# Patient Record
Sex: Female | Born: 1950 | Race: White | Hispanic: No | Marital: Married | Smoking: Former smoker
Health system: Southern US, Community
[De-identification: ages and names within clinical notes are randomized; demographics above are authoritative.]

## PROBLEM LIST (undated history)

## (undated) DIAGNOSIS — I341 Nonrheumatic mitral (valve) prolapse: Secondary | ICD-10-CM

## (undated) DIAGNOSIS — M810 Age-related osteoporosis without current pathological fracture: Secondary | ICD-10-CM

## (undated) DIAGNOSIS — E785 Hyperlipidemia, unspecified: Secondary | ICD-10-CM

## (undated) DIAGNOSIS — I499 Cardiac arrhythmia, unspecified: Secondary | ICD-10-CM

## (undated) DIAGNOSIS — G479 Sleep disorder, unspecified: Secondary | ICD-10-CM

## (undated) DIAGNOSIS — F419 Anxiety disorder, unspecified: Secondary | ICD-10-CM

## (undated) DIAGNOSIS — Z85828 Personal history of other malignant neoplasm of skin: Secondary | ICD-10-CM

## (undated) DIAGNOSIS — I059 Rheumatic mitral valve disease, unspecified: Secondary | ICD-10-CM

## (undated) DIAGNOSIS — K5792 Diverticulitis of intestine, part unspecified, without perforation or abscess without bleeding: Secondary | ICD-10-CM

## (undated) DIAGNOSIS — K631 Perforation of intestine (nontraumatic): Secondary | ICD-10-CM

## (undated) DIAGNOSIS — F410 Panic disorder [episodic paroxysmal anxiety] without agoraphobia: Secondary | ICD-10-CM

## (undated) DIAGNOSIS — N952 Postmenopausal atrophic vaginitis: Secondary | ICD-10-CM

## (undated) DIAGNOSIS — R0789 Other chest pain: Secondary | ICD-10-CM

## (undated) DIAGNOSIS — R112 Nausea with vomiting, unspecified: Secondary | ICD-10-CM

## (undated) DIAGNOSIS — K5732 Diverticulitis of large intestine without perforation or abscess without bleeding: Secondary | ICD-10-CM

## (undated) DIAGNOSIS — C801 Malignant (primary) neoplasm, unspecified: Secondary | ICD-10-CM

## (undated) DIAGNOSIS — Z933 Colostomy status: Secondary | ICD-10-CM

## (undated) DIAGNOSIS — R002 Palpitations: Secondary | ICD-10-CM

## (undated) DIAGNOSIS — Z9889 Other specified postprocedural states: Secondary | ICD-10-CM

## (undated) DIAGNOSIS — I10 Essential (primary) hypertension: Secondary | ICD-10-CM

## (undated) DIAGNOSIS — Z800 Family history of malignant neoplasm of digestive organs: Secondary | ICD-10-CM

## (undated) DIAGNOSIS — N950 Postmenopausal bleeding: Secondary | ICD-10-CM

## (undated) DIAGNOSIS — T8859XA Other complications of anesthesia, initial encounter: Secondary | ICD-10-CM

## (undated) DIAGNOSIS — D259 Leiomyoma of uterus, unspecified: Secondary | ICD-10-CM

## (undated) DIAGNOSIS — M545 Low back pain: Secondary | ICD-10-CM

## (undated) DIAGNOSIS — N951 Menopausal and female climacteric states: Secondary | ICD-10-CM

## (undated) DIAGNOSIS — T4145XA Adverse effect of unspecified anesthetic, initial encounter: Secondary | ICD-10-CM

## (undated) DIAGNOSIS — N909 Noninflammatory disorder of vulva and perineum, unspecified: Secondary | ICD-10-CM

## (undated) DIAGNOSIS — R945 Abnormal results of liver function studies: Secondary | ICD-10-CM

## (undated) HISTORY — DX: Rheumatic mitral valve disease, unspecified: I05.9

## (undated) HISTORY — DX: Essential (primary) hypertension: I10

## (undated) HISTORY — DX: Cardiac arrhythmia, unspecified: I49.9

## (undated) HISTORY — DX: Panic disorder (episodic paroxysmal anxiety): F41.0

## (undated) HISTORY — DX: Low back pain: M54.5

## (undated) HISTORY — DX: Other chest pain: R07.89

## (undated) HISTORY — DX: Leiomyoma of uterus, unspecified: D25.9

## (undated) HISTORY — DX: Hyperlipidemia, unspecified: E78.5

## (undated) HISTORY — DX: Postmenopausal atrophic vaginitis: N95.2

## (undated) HISTORY — DX: Family history of malignant neoplasm of digestive organs: Z80.0

## (undated) HISTORY — PX: COLON SURGERY: SHX602

## (undated) HISTORY — PX: OOPHORECTOMY: SHX86

## (undated) HISTORY — DX: Menopausal and female climacteric states: N95.1

## (undated) HISTORY — DX: Palpitations: R00.2

## (undated) HISTORY — DX: Noninflammatory disorder of vulva and perineum, unspecified: N90.9

## (undated) HISTORY — DX: Abnormal results of liver function studies: R94.5

## (undated) HISTORY — DX: Diverticulitis of large intestine without perforation or abscess without bleeding: K57.32

## (undated) HISTORY — DX: Postmenopausal bleeding: N95.0

---

## 1997-05-26 ENCOUNTER — Other Ambulatory Visit: Admission: RE | Admit: 1997-05-26 | Discharge: 1997-05-26 | Payer: Self-pay | Admitting: *Deleted

## 1997-12-10 ENCOUNTER — Emergency Department (HOSPITAL_COMMUNITY): Admission: EM | Admit: 1997-12-10 | Discharge: 1997-12-10 | Payer: Self-pay | Admitting: Emergency Medicine

## 1998-04-29 ENCOUNTER — Other Ambulatory Visit: Admission: RE | Admit: 1998-04-29 | Discharge: 1998-04-29 | Payer: Self-pay | Admitting: *Deleted

## 1998-10-30 ENCOUNTER — Emergency Department (HOSPITAL_COMMUNITY): Admission: EM | Admit: 1998-10-30 | Discharge: 1998-10-30 | Payer: Self-pay | Admitting: Emergency Medicine

## 1998-12-10 ENCOUNTER — Encounter: Admission: RE | Admit: 1998-12-10 | Discharge: 1998-12-10 | Payer: Self-pay | Admitting: *Deleted

## 1999-12-09 ENCOUNTER — Other Ambulatory Visit: Admission: RE | Admit: 1999-12-09 | Discharge: 1999-12-09 | Payer: Self-pay | Admitting: Obstetrics and Gynecology

## 1999-12-12 ENCOUNTER — Other Ambulatory Visit: Admission: RE | Admit: 1999-12-12 | Discharge: 1999-12-12 | Payer: Self-pay | Admitting: *Deleted

## 2000-02-08 ENCOUNTER — Ambulatory Visit (HOSPITAL_COMMUNITY): Admission: RE | Admit: 2000-02-08 | Discharge: 2000-02-08 | Payer: Self-pay | Admitting: Family Medicine

## 2000-02-28 HISTORY — PX: CARDIAC CATHETERIZATION: SHX172

## 2000-12-10 ENCOUNTER — Encounter: Payer: Self-pay | Admitting: Cardiovascular Disease

## 2000-12-10 ENCOUNTER — Ambulatory Visit (HOSPITAL_COMMUNITY): Admission: RE | Admit: 2000-12-10 | Discharge: 2000-12-10 | Payer: Self-pay | Admitting: Cardiovascular Disease

## 2000-12-17 ENCOUNTER — Ambulatory Visit (HOSPITAL_COMMUNITY): Admission: RE | Admit: 2000-12-17 | Discharge: 2000-12-17 | Payer: Self-pay | Admitting: Family Medicine

## 2000-12-17 ENCOUNTER — Encounter: Payer: Self-pay | Admitting: Family Medicine

## 2001-05-02 ENCOUNTER — Other Ambulatory Visit: Admission: RE | Admit: 2001-05-02 | Discharge: 2001-05-02 | Payer: Self-pay | Admitting: *Deleted

## 2001-05-06 ENCOUNTER — Encounter: Admission: RE | Admit: 2001-05-06 | Discharge: 2001-05-06 | Payer: Self-pay | Admitting: *Deleted

## 2001-11-05 ENCOUNTER — Other Ambulatory Visit: Admission: RE | Admit: 2001-11-05 | Discharge: 2001-11-05 | Payer: Self-pay | Admitting: *Deleted

## 2001-12-16 ENCOUNTER — Encounter (INDEPENDENT_AMBULATORY_CARE_PROVIDER_SITE_OTHER): Payer: Self-pay | Admitting: Specialist

## 2001-12-16 ENCOUNTER — Ambulatory Visit (HOSPITAL_COMMUNITY): Admission: RE | Admit: 2001-12-16 | Discharge: 2001-12-16 | Payer: Self-pay | Admitting: *Deleted

## 2002-01-20 ENCOUNTER — Encounter: Admission: RE | Admit: 2002-01-20 | Discharge: 2002-01-20 | Payer: Self-pay | Admitting: *Deleted

## 2003-01-26 ENCOUNTER — Encounter: Admission: RE | Admit: 2003-01-26 | Discharge: 2003-01-26 | Payer: Self-pay | Admitting: *Deleted

## 2003-01-29 ENCOUNTER — Other Ambulatory Visit: Admission: RE | Admit: 2003-01-29 | Discharge: 2003-01-29 | Payer: Self-pay | Admitting: *Deleted

## 2003-02-12 ENCOUNTER — Ambulatory Visit: Admission: RE | Admit: 2003-02-12 | Discharge: 2003-02-12 | Payer: Self-pay | Admitting: Family Medicine

## 2004-10-17 ENCOUNTER — Encounter: Admission: RE | Admit: 2004-10-17 | Discharge: 2004-10-17 | Payer: Self-pay | Admitting: *Deleted

## 2005-11-27 ENCOUNTER — Encounter: Admission: RE | Admit: 2005-11-27 | Discharge: 2005-11-27 | Payer: Self-pay | Admitting: *Deleted

## 2006-11-27 ENCOUNTER — Encounter: Admission: RE | Admit: 2006-11-27 | Discharge: 2006-11-27 | Payer: Self-pay | Admitting: Obstetrics and Gynecology

## 2006-12-03 ENCOUNTER — Encounter: Admission: RE | Admit: 2006-12-03 | Discharge: 2006-12-03 | Payer: Self-pay | Admitting: Obstetrics and Gynecology

## 2007-07-04 ENCOUNTER — Ambulatory Visit: Payer: Self-pay | Admitting: Vascular Surgery

## 2007-07-04 ENCOUNTER — Ambulatory Visit: Admission: RE | Admit: 2007-07-04 | Discharge: 2007-07-04 | Payer: Self-pay | Admitting: Family Medicine

## 2007-07-04 ENCOUNTER — Encounter (INDEPENDENT_AMBULATORY_CARE_PROVIDER_SITE_OTHER): Payer: Self-pay | Admitting: Family Medicine

## 2007-12-03 ENCOUNTER — Encounter: Admission: RE | Admit: 2007-12-03 | Discharge: 2007-12-03 | Payer: Self-pay | Admitting: Obstetrics

## 2008-12-31 ENCOUNTER — Encounter: Admission: RE | Admit: 2008-12-31 | Discharge: 2008-12-31 | Payer: Self-pay | Admitting: Obstetrics

## 2010-01-04 ENCOUNTER — Encounter: Admission: RE | Admit: 2010-01-04 | Discharge: 2010-01-04 | Payer: Self-pay | Admitting: Obstetrics

## 2010-07-15 NOTE — Op Note (Signed)
NAME:  Kristen Reyes, Kristen Reyes                       ACCOUNT NO.:  [id number]   MEDICAL RECORD NO.:  [id number]                   PATIENT TYPE:  AMB   LOCATION:  DAY                                  FACILITY:  [facility]   PHYSICIAN:  Pershing Cox, M.D.            DATE OF BIRTH:  06/10/1950   DATE OF PROCEDURE:  12/16/2001  DATE OF DISCHARGE:                                 OPERATIVE REPORT   PREOPERATIVE DIAGNOSIS:  Left adnexal cyst.   POSTOPERATIVE DIAGNOSES:  1. Serous cystadenoma of the left ovary.  2. Corpus luteum of the right ovary.   PROCEDURE:  Diagnostic laparoscopy, left salpingo-oophorectomy, and right  ovarian cystectomy.   ANESTHESIA:  General endotracheal.   SURGEON:  Pershing Cox, M.D.   ASSISTANT:  Chester Holstein. Earlene Plater, M.D.   INDICATION FOR PROCEDURE:  The patient is 60 years old.  She is followed in  my office for routine care.  She was found to have a large adnexal mass in  the cul-de-sac at the time of her last annual examination in March.  Sonogram showed this to be a simple cyst.  We repeated the sonography in  September of this year, and the cyst was still present.  It had grown  slightly in size and was 9 cm.  The sonogram showed no features worrisome  for malignancy, and CA125 was less than 30.  The patient is brought to the  operating room today to attempt to remove this cyst laparoscopically.  Plans  were made to do frozen section and if positive, the patient will return  another day for staging procedure.   OPERATIVE FINDINGS:  There was no evidence of ascites.  Peritoneal surfaces  were smooth.  The right ovary initially looked normal but after performing  the left salpingo-oophorectomy and looking closer, we saw an enlarging cyst  on the ovary.  It got bigger as we watched, and we felt it was important to  evaluate this.  On opening it we found that this was a corpus luteum, and  the cyst contents were removed and sent for final pathology as  well.   DESCRIPTION OF PROCEDURE:  The patient was brought to the operating room  with an IV in place.  She had 400 mg of ciprofloxacin begun in the holding  area.  Thigh-high PAS stockings were placed.  She was placed supine on the  OR table and general endotracheal anesthesia was then administered.  She was  placed in the Salvo stirrups in a frogleg position.  The anterior abdominal  wall was cleansed with Hibiclens.  Special attention was made to cleanse the  umbilicus.  The vagina was prepped, and a Hulka tenaculum was inserted into  the uterus for manipulation.  A Foley catheter was sterilely inserted into  the bladder.   Marcaine was used to anesthetize the subcutaneous tissues and skin inferior  to the lower portion of the umbilicus  and then the right and left lower  quadrants.  A stab incision was made in the umbilicus, carrying it in a  midline fashion approximately 1 cm in length.  Subcutaneous tissue were  dissected.  Fascia was grasped with a Kocher clamp and opened.  The  peritoneum was lifted and opened.  Vicryl 0 UR6 was used to create a  pursestring around the fascia.  The Hasson cannula was then inserted and  fixed to the sutures.  It later had to be withdrawn somewhat, as it was too  deep inside the abdominal cavity for good visualization of the pelvis.  The  Hulka tenaculum was lifted, and the mass was visualized.  It seemed to be  quite mobile, and there were no stigmata of malignancy.  Stab incision was  made in the right lower quadrant and a 5 mm port was placed.  Nezhat  aspirator was passed, and 500 cc of saline were instilled and then retrieved  for peritoneal washings.  An 11/12 port was placed in the left lower  quadrant.  Both of these ports were placed directly visualizing both the  vessels that could be seen internally and vessels that could be visualized  through the skin with transillumination.  Once the 11/12 port had been  placed, the IP ligament was  visualized.  We could see the ureter and its  movement way below the IP ligament.  The IP ligament was then cauterized and  cut using the tripolar cautery.  We continued to lift the surfaces of the  ovary as the tripolar then separated the ovary from the broad ligament and  its attachment to the utero-ovarian ligament.  It was separated from these  structures intact.  There was a small amount of bleeding on the left  sidewall, which was easily controlled using the cautery from the tripolar.  Approximately 50% of the fallopian tube was removed with this dissection.  Once the ovary was free, it was lifted and an EndoCatch bag was placed  beneath it.  Then through the right lower port, a needle attached to an  aspirator and syringe was used to drain fluid from the ovary.  Approximately  200 cc were drained, helping to collapse the cyst into the Endobag.  The  Endobag was then drawn closed and pulled up through the left lower quadrant  port site.  The bag was opened, the ovary was visualized, and an 18 gauge  needle and syringe were used to collapse the ovarian cyst completely, and  this could then be drawn out through the left port.  The ovary was sent for  frozen section.  It eventually returned as a serous cystadenoma and no  evidence of malignancy.  A pursestring suture was then placed using 0 Vicryl  through this pursestring around the 11/12 port in the left lower quadrant.  The 11/12 trocar was then reinserted and using the Nezhat aspirator, we were  able to visualize the sites of our original resection, looking for any sites  of bleeding.  There were none.  During the procedure photographs had been  taken of the ovary and fallopian tube on the patient's right and left.  Photos were also taken of the appendix and of the liver on both sides of the  falciform ligament.  In inspecting the ovary of this side, it was clear that there was an expanding cyst.  It looked to be a bleeding cyst.  We  watched  it and as it grew,  a decision was made to open the cyst and to cauterize any  bleeders.  The Nezhat was placed through the right lower quadrant port and  the tripolar was used to cauterize a site on the cyst.  The cyst was then  opened and blood was drained from the site.  The Nezhat was used to  irrigate, and we retracted the cyst wall using grasping forceps. The cyst  wall and cyst contents were sent for specimen.  We continued to inspect the  open cyst of the ovary, and there seemed to be no evidence of bleeding at  any site.  The pelvic cavity was then irrigated.  The patient was placed  into reverse Trendelenburg to remove gas and air from the upper abdomen.  She was then placed supine.  Under direct visualization the 11/12 port was  removed and the fascial stitch was drawn together in a pursestring while  watching with the laparoscope.  The right-sided port was then removed.  The  umbilical port was removed under direct visualization and with a finger in  the port site, the pursestring suture was drawn closed.  Subcutaneous  tissues were closed with UR6 sterile Vicryl.  A second layer was used to  bring the skin edges together beneath the umbilicus.  A 4-0 suture was then  used to perform a single-stitch closure of the 11/12 port over the  pursestring suture.  Dermabond was used to close the skin on the left and  right ports.   Specimens included peritoneal washings, ovarian cyst fluid, left fallopian  tube and ovary, and right ovarian cyst wall.  Complications:  None.                                               Pershing Cox, M.D.    MAJ/MEDQ  D:  12/16/2001  T:  12/16/2001  Job:  [id number]   cc:   Holley Bouche, M.D.  [address]  [place], [place] [address]  Fax: [phone]   Chester Holstein. Earlene Plater, M.D.  [address]  [place]  [place] [address]  Fax: [phone]

## 2010-07-15 NOTE — Cardiovascular Report (Signed)
[place]  Patient:    Kristen Reyes, Kristen Reyes Visit Number: [id number] MRN: [id number]          Service Type: CAT Location: [facility] 2899 10 Attending Physician:  Ruta Hinds Dictated by:   Pearletha Furl Alanda Amass, M.D. Proc. Date: 12/10/00 Admit Date:  12/10/2000   CC:         CP Lab  Arvella Merles, M.D.  Runell Gess, M.D.   Cardiac Catheterization  PROCEDURES:  Retrograde central aortic catheterization; selective coronary angiography by Judkins technique; left ventricular angiogram, RAO, LAO projection, abdominal aortic angiogram, midstream PA projection.  DESCRIPTION OF PROCEDURE:  The patient was brought to the second floor CP lab in the postabsorptive state after 5 mg Valium p.o. premedication.  She was a same-day admission with the normal preoperative laboratory and hydrated preoperatively.  She was given 2 mg of Versed for sedation in the lab IV and 1 mg of Nubain at the end of the procedure IV.  Omnipaque dye was used throughout the procedure.  Coronary angiography was performed with 6 French 4 cm tapered preformed Cordis coronary and pigtail catheters through a 6 [place] short Daig sidearm sheath that was placed in the RCFA with a single anterior puncture using an 18 thin-wall needle and modified Seldinger technique.  LV angiogram was done in the RAO and LAO projection, 25 cc, 14 cc per second, 20 cc, 12 cc per second, respectively.  Pullback pressure to the CA was performed and showed no gradient across the aortic valve.  Abdominal aortic angiogram was done in the midstream PA projection at 25 cc, 20 cc per second. Catheter was removed, sidearm sheath was flushed.  The patient tolerated the procedure well.  She was transferred to the holding area for sheath removal with pressure hemostasis in stable condition.  RESULTS:  Pressures:  LV:  158/0; LVEDP 16 mmHg.  CA:  158/85 mmHg.  There was no gradient across the  aortic valve on catheter pullback.  Fluoroscopy did not reveal any coronary, intracardiac, or valvular calcification.  The main left coronary was normal.  The left anterior descending artery was widely patent and smooth.  It coursed to the apex and undersurface of the heart.  It was entirely normal throughout its course with no evidence of atherosclerotic disease, spasm, or stenosis. There was a large septal perforator from the proximal third of the vessel and a moderate-sized branching diagonal from the midportion, a small diagonal from the distal third, both of which were normal.  There was a moderately large optional diagonal branch that bifurcated and was normal.  The circumflex was a nondominant vessel giving off two small proximal marginal branches and a distal third marginal that was of moderate size, tortuous, and normal, and a normal PAVG branch.  The right coronary was the dominant vessel.  It was subselectively catheterized because of a posterior takeoff but well-visualized.  It was smooth and widely patent throughout its course and normal with a normal PDA, PLA, normal atrial branches proximally and RV branches from the midportion.  LV angiogram demonstrated a normally-contracting ventricle with no segmental wall motion abnormality and normal EF approximately 60%.  There was an angiographic mitral valve prolapse with trace mitral regurgitation present.  Abdominal aortic angiogram in the midstream PA projection showed a normal celiac and SMA axis proximally and normal single renal arteries bilaterally. The infrarenal abdominal aorta was normal, and visualization was normal up to the proximal iliacs with good runoff.  DISCUSSION:  Ms. Abbey history is well-outlined.  She is a pleasant 60 year old divorced and remarried mother of two children, who has been under a lot of stress lately because of problems with one of her sons.  She has a history of clinical mitral  valve prolapse with palpitations.  There was also a history of systemic hypertension.  She is a remote smoker, quitting in 58. She has had a history of atypical musculoskeletal-type chest pain but has also had a component of exertional chest discomfort.  She was seen by another cardiologist, Meade Maw, M.D., for evaluation of her chest pain.  A Cardiolite was obtained, which showed an anteroseptal defect near the base suggesting reversibility.  There is considerable anxiety about her chest pain symptoms and activity level.  It was felt that diagnostic catheterization was indicated in this setting.  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  Fortunately, diagnostic catheterization reveals entirely normal coronary anatomy.  There is no evidence of spasm.  There is normal LV function with minimal angiographic mitral valve prolapse.  Systemic hypertension is present with normal renal arteries.  I would recommend reassurance to the patient.  Medical therapy of her chest pain syndrome, probable component of GERD related to her recent nonsteroidals, under medical therapy at present.  She also has mildly elevated LFTs with negative hepatitis serologies, thought to possibly be due to nonsteroidal medication, and this is under evaluation by Dr. Tiburcio Pea.  CATHETERIZATION DIAGNOSES: 1. Chest pain, etiology not determined. 2. Normal coronary arteries and left ventricle. 3. Systemic hypertension, normal renal arteries. 4. Normal left ventricular function, angiographic mitral valve prolapse with    trace mitral regurgitation. 5. Systemic hypertension. 6. Elevated liver enzymes. 7. Probable gastroesophageal reflux disease and possible esophageal spasm,    probably in part related to nonsteroidal use. 8. Elevated liver function tests with liver ultrasound pending by Dr. Tiburcio Pea.  Dictated by:   Pearletha Furl Alanda Amass, M.D. Attending Physician:  Ruta Hinds DD:  12/10/00 TD:  12/10/00 Job: [id number]

## 2010-09-05 ENCOUNTER — Other Ambulatory Visit: Payer: Self-pay | Admitting: Obstetrics

## 2010-09-27 ENCOUNTER — Other Ambulatory Visit: Payer: Self-pay | Admitting: Obstetrics

## 2010-09-27 DIAGNOSIS — Z1231 Encounter for screening mammogram for malignant neoplasm of breast: Secondary | ICD-10-CM

## 2010-10-05 ENCOUNTER — Ambulatory Visit
Admission: RE | Admit: 2010-10-05 | Discharge: 2010-10-05 | Disposition: A | Discharge status: Home / Self Care | Payer: Self-pay | Source: Ambulatory Visit | Attending: Obstetrics | Admitting: Obstetrics

## 2011-01-31 ENCOUNTER — Ambulatory Visit
Admission: RE | Admit: 2011-01-31 | Discharge: 2011-01-31 | Disposition: A | Discharge status: Home / Self Care | Payer: BC Managed Care – PPO | Source: Ambulatory Visit | Attending: Obstetrics | Admitting: Obstetrics

## 2011-01-31 DIAGNOSIS — Z1231 Encounter for screening mammogram for malignant neoplasm of breast: Secondary | ICD-10-CM

## 2011-08-02 ENCOUNTER — Encounter: Payer: Self-pay | Admitting: Gastroenterology

## 2011-08-03 NOTE — Telephone Encounter (Signed)
This was openend in error.

## 2012-03-07 ENCOUNTER — Other Ambulatory Visit: Payer: Self-pay | Admitting: Obstetrics

## 2012-03-07 DIAGNOSIS — Z1231 Encounter for screening mammogram for malignant neoplasm of breast: Secondary | ICD-10-CM

## 2012-04-01 ENCOUNTER — Ambulatory Visit
Admission: RE | Admit: 2012-04-01 | Discharge: 2012-04-01 | Disposition: A | Discharge status: Home / Self Care | Payer: BC Managed Care – PPO | Source: Ambulatory Visit | Attending: Obstetrics | Admitting: Obstetrics

## 2012-04-01 DIAGNOSIS — Z1231 Encounter for screening mammogram for malignant neoplasm of breast: Secondary | ICD-10-CM

## 2013-01-13 ENCOUNTER — Other Ambulatory Visit: Payer: Self-pay | Admitting: Nurse Practitioner

## 2013-01-13 DIAGNOSIS — Z8679 Personal history of other diseases of the circulatory system: Secondary | ICD-10-CM

## 2013-01-13 DIAGNOSIS — R002 Palpitations: Secondary | ICD-10-CM

## 2013-01-14 ENCOUNTER — Other Ambulatory Visit: Payer: Self-pay | Admitting: Obstetrics

## 2013-01-14 ENCOUNTER — Encounter: Payer: Self-pay | Admitting: Radiology

## 2013-01-14 ENCOUNTER — Encounter (INDEPENDENT_AMBULATORY_CARE_PROVIDER_SITE_OTHER): Payer: BC Managed Care – PPO

## 2013-01-14 DIAGNOSIS — R002 Palpitations: Secondary | ICD-10-CM

## 2013-01-14 DIAGNOSIS — Z8679 Personal history of other diseases of the circulatory system: Secondary | ICD-10-CM

## 2013-01-14 DIAGNOSIS — M810 Age-related osteoporosis without current pathological fracture: Secondary | ICD-10-CM

## 2013-01-14 NOTE — Progress Notes (Signed)
Patient ID: Kristen Reyes, female   DOB: 06/14/1950, 62 y.o.   MRN: [id number] lifewatch 30 day monitor applied

## 2013-01-31 ENCOUNTER — Other Ambulatory Visit: Payer: Self-pay

## 2013-01-31 DIAGNOSIS — Z1231 Encounter for screening mammogram for malignant neoplasm of breast: Secondary | ICD-10-CM

## 2013-02-25 ENCOUNTER — Other Ambulatory Visit: Payer: BC Managed Care – PPO

## 2013-04-15 ENCOUNTER — Other Ambulatory Visit: Payer: BC Managed Care – PPO

## 2013-04-15 ENCOUNTER — Ambulatory Visit: Payer: BC Managed Care – PPO

## 2013-05-19 ENCOUNTER — Ambulatory Visit
Admission: RE | Admit: 2013-05-19 | Discharge: 2013-05-19 | Disposition: A | Discharge status: Home / Self Care | Payer: Managed Care, Other (non HMO) | Source: Ambulatory Visit

## 2013-05-19 ENCOUNTER — Ambulatory Visit
Admission: RE | Admit: 2013-05-19 | Discharge: 2013-05-19 | Disposition: A | Discharge status: Home / Self Care | Payer: Managed Care, Other (non HMO) | Source: Ambulatory Visit | Attending: Obstetrics | Admitting: Obstetrics

## 2013-05-19 DIAGNOSIS — Z1231 Encounter for screening mammogram for malignant neoplasm of breast: Secondary | ICD-10-CM

## 2013-05-19 DIAGNOSIS — M810 Age-related osteoporosis without current pathological fracture: Secondary | ICD-10-CM

## 2013-06-18 ENCOUNTER — Encounter: Payer: Self-pay | Admitting: Internal Medicine

## 2013-06-30 ENCOUNTER — Other Ambulatory Visit: Payer: Self-pay | Admitting: Family Medicine

## 2013-06-30 ENCOUNTER — Ambulatory Visit
Admission: RE | Admit: 2013-06-30 | Discharge: 2013-06-30 | Disposition: A | Discharge status: Home / Self Care | Payer: Managed Care, Other (non HMO) | Source: Ambulatory Visit | Attending: Family Medicine | Admitting: Family Medicine

## 2013-06-30 DIAGNOSIS — R109 Unspecified abdominal pain: Secondary | ICD-10-CM

## 2013-07-06 ENCOUNTER — Encounter (HOSPITAL_COMMUNITY): Payer: Self-pay | Admitting: Emergency Medicine

## 2013-07-06 ENCOUNTER — Emergency Department (HOSPITAL_COMMUNITY): Payer: Managed Care, Other (non HMO)

## 2013-07-06 ENCOUNTER — Emergency Department (HOSPITAL_COMMUNITY)
Admission: EM | Admit: 2013-07-06 | Discharge: 2013-07-06 | Disposition: A | Discharge status: Home / Self Care | Payer: Managed Care, Other (non HMO) | Attending: Emergency Medicine | Admitting: Emergency Medicine

## 2013-07-06 DIAGNOSIS — Z79899 Other long term (current) drug therapy: Secondary | ICD-10-CM | POA: Insufficient documentation

## 2013-07-06 DIAGNOSIS — F3289 Other specified depressive episodes: Secondary | ICD-10-CM | POA: Insufficient documentation

## 2013-07-06 DIAGNOSIS — K5900 Constipation, unspecified: Secondary | ICD-10-CM | POA: Insufficient documentation

## 2013-07-06 DIAGNOSIS — F329 Major depressive disorder, single episode, unspecified: Secondary | ICD-10-CM | POA: Insufficient documentation

## 2013-07-06 DIAGNOSIS — Z9889 Other specified postprocedural states: Secondary | ICD-10-CM | POA: Insufficient documentation

## 2013-07-06 DIAGNOSIS — I10 Essential (primary) hypertension: Secondary | ICD-10-CM | POA: Insufficient documentation

## 2013-07-06 DIAGNOSIS — Z7982 Long term (current) use of aspirin: Secondary | ICD-10-CM | POA: Insufficient documentation

## 2013-07-06 DIAGNOSIS — Z9079 Acquired absence of other genital organ(s): Secondary | ICD-10-CM | POA: Insufficient documentation

## 2013-07-06 DIAGNOSIS — Z880 Allergy status to penicillin: Secondary | ICD-10-CM | POA: Insufficient documentation

## 2013-07-06 DIAGNOSIS — K529 Noninfective gastroenteritis and colitis, unspecified: Secondary | ICD-10-CM

## 2013-07-06 DIAGNOSIS — Z87891 Personal history of nicotine dependence: Secondary | ICD-10-CM | POA: Insufficient documentation

## 2013-07-06 DIAGNOSIS — K5289 Other specified noninfective gastroenteritis and colitis: Secondary | ICD-10-CM | POA: Insufficient documentation

## 2013-07-06 HISTORY — DX: Essential (primary) hypertension: I10

## 2013-07-06 LAB — LIPASE, BLOOD: Lipase: 13 U/L (ref 11–59)

## 2013-07-06 LAB — URINALYSIS, ROUTINE W REFLEX MICROSCOPIC
Glucose, UA: NEGATIVE mg/dL
Hgb urine dipstick: NEGATIVE
KETONES UR: 15 mg/dL — AB
LEUKOCYTES UA: NEGATIVE
Nitrite: NEGATIVE
PROTEIN: NEGATIVE mg/dL
Specific Gravity, Urine: 1.038 — ABNORMAL HIGH (ref 1.005–1.030)
UROBILINOGEN UA: 1 mg/dL (ref 0.0–1.0)
pH: 5 (ref 5.0–8.0)

## 2013-07-06 LAB — COMPREHENSIVE METABOLIC PANEL
ALT: 57 U/L — ABNORMAL HIGH (ref 0–35)
AST: 76 U/L — ABNORMAL HIGH (ref 0–37)
Albumin: 3.8 g/dL (ref 3.5–5.2)
Alkaline Phosphatase: 142 U/L — ABNORMAL HIGH (ref 39–117)
BUN: 11 mg/dL (ref 6–23)
CALCIUM: 9.5 mg/dL (ref 8.4–10.5)
CO2: 26 meq/L (ref 19–32)
Chloride: 100 mEq/L (ref 96–112)
Creatinine, Ser: 0.61 mg/dL (ref 0.50–1.10)
GLUCOSE: 106 mg/dL — AB (ref 70–99)
Potassium: 3.5 mEq/L — ABNORMAL LOW (ref 3.7–5.3)
Sodium: 140 mEq/L (ref 137–147)
Total Bilirubin: 0.9 mg/dL (ref 0.3–1.2)
Total Protein: 7.8 g/dL (ref 6.0–8.3)

## 2013-07-06 LAB — CBC WITH DIFFERENTIAL/PLATELET
BASOS ABS: 0 10*3/uL (ref 0.0–0.1)
Basophils Relative: 0 % (ref 0–1)
EOS PCT: 1 % (ref 0–5)
Eosinophils Absolute: 0.1 10*3/uL (ref 0.0–0.7)
HEMATOCRIT: 39.6 % (ref 36.0–46.0)
Hemoglobin: 13.4 g/dL (ref 12.0–15.0)
LYMPHS ABS: 1 10*3/uL (ref 0.7–4.0)
Lymphocytes Relative: 10 % — ABNORMAL LOW (ref 12–46)
MCH: 29.5 pg (ref 26.0–34.0)
MCHC: 33.8 g/dL (ref 30.0–36.0)
MCV: 87 fL (ref 78.0–100.0)
Monocytes Absolute: 1 10*3/uL (ref 0.1–1.0)
Monocytes Relative: 9 % (ref 3–12)
Neutro Abs: 8.1 10*3/uL — ABNORMAL HIGH (ref 1.7–7.7)
Neutrophils Relative %: 80 % — ABNORMAL HIGH (ref 43–77)
Platelets: 225 10*3/uL (ref 150–400)
RBC: 4.55 MIL/uL (ref 3.87–5.11)
RDW: 12 % (ref 11.5–15.5)
WBC: 10.1 10*3/uL (ref 4.0–10.5)

## 2013-07-06 MED ORDER — CIPROFLOXACIN HCL 500 MG PO TABS: 500.0000 mg | ORAL_TABLET | Freq: Two times a day (BID) | ORAL | Status: DC

## 2013-07-06 MED ORDER — IOHEXOL 300 MG/ML  SOLN
50.0000 mL | Freq: Once | INTRAMUSCULAR | Status: AC | PRN
  Administered 2013-07-06: 50 mL via ORAL

## 2013-07-06 MED ORDER — FLEET ENEMA 7-19 GM/118ML RE ENEM
1.0000 | ENEMA | Freq: Once | RECTAL | Status: AC
  Administered 2013-07-06: 1 via RECTAL
  Filled 2013-07-06: qty 1

## 2013-07-06 MED ORDER — METRONIDAZOLE 500 MG PO TABS
500.0000 mg | ORAL_TABLET | Freq: Three times a day (TID) | ORAL | Status: DC
Start: 2013-07-06 — End: 2013-07-25

## 2013-07-06 MED ORDER — HYDROCODONE-ACETAMINOPHEN 5-325 MG PO TABS: 1.0000 | ORAL_TABLET | ORAL | Status: DC | PRN

## 2013-07-06 MED ORDER — IOHEXOL 300 MG/ML  SOLN
100.0000 mL | Freq: Once | INTRAMUSCULAR | Status: AC | PRN
  Administered 2013-07-06: 100 mL via INTRAVENOUS

## 2013-07-06 NOTE — Discharge Instructions (Signed)
Read the information below.  Use the prescribed medication as directed.  Please discuss all new medications with your pharmacist.  Do not take additional tylenol while taking the prescribed pain medication to avoid overdose.  You may return to the Emergency Department at any time for worsening condition or any new symptoms that concern you.  If you develop high fevers, worsening abdominal pain, uncontrolled vomiting, or are unable to tolerate fluids by mouth or are unable to have bowel movement or pass gas, return to the ER for a recheck.     Colitis Colitis is inflammation of the colon. Colitis can be a short-term or long-standing (chronic) illness. Crohn's disease and ulcerative colitis are 2 types of colitis which are chronic. They usually require lifelong treatment. CAUSES  There are many different causes of colitis, including:  Viruses.  Germs (bacteria).  Medicine reactions. SYMPTOMS   Diarrhea.  Intestinal bleeding.  Pain.  Fever.  Throwing up (vomiting).  Tiredness (fatigue).  Weight loss.  Bowel blockage. DIAGNOSIS  The diagnosis of colitis is based on examination and stool or blood tests. X-rays, CT scan, and colonoscopy may also be needed. TREATMENT  Treatment may include:  Fluids given through the vein (intravenously).  Bowel rest (nothing to eat or drink for a period of time).  Medicine for pain and diarrhea.  Medicines (antibiotics) that kill germs.  Cortisone medicines.  Surgery. HOME CARE INSTRUCTIONS   Get plenty of rest.  Drink enough water and fluids to keep your urine clear or pale yellow.  Eat a well-balanced diet.  Call your caregiver for follow-up as recommended. SEEK IMMEDIATE MEDICAL CARE IF:   You develop chills.  You have an oral temperature above 102 F (38.9 C), not controlled by medicine.  You have extreme weakness, fainting, or dehydration.  You have repeated vomiting.  You develop severe belly (abdominal) pain or are  passing bloody or tarry stools. MAKE SURE YOU:   Understand these instructions.  Will watch your condition.  Will get help right away if you are not doing well or get worse. Document Released: 03/23/2004 Document Revised: 05/08/2011 Document Reviewed: 06/18/2009 [facility] Patient Information 2014 [place], [place].  Constipation, Adult Constipation is when a person has fewer than 3 bowel movements a week; has difficulty having a bowel movement; or has stools that are dry, hard, or larger than normal. As people grow older, constipation is more common. If you try to fix constipation with medicines that make you have a bowel movement (laxatives), the problem may get worse. Long-term laxative use may cause the muscles of the colon to become weak. A low-fiber diet, not taking in enough fluids, and taking certain medicines may make constipation worse. CAUSES   Certain medicines, such as antidepressants, pain medicine, iron supplements, antacids, and water pills.   Certain diseases, such as diabetes, irritable bowel syndrome (IBS), thyroid disease, or depression.   Not drinking enough water.   Not eating enough fiber-rich foods.   Stress or travel.  Lack of physical activity or exercise.  Not going to the restroom when there is the urge to have a bowel movement.  Ignoring the urge to have a bowel movement.  Using laxatives too much. SYMPTOMS   Having fewer than 3 bowel movements a week.   Straining to have a bowel movement.   Having hard, dry, or larger than normal stools.   Feeling full or bloated.   Pain in the lower abdomen.  Not feeling relief after having a bowel movement. DIAGNOSIS  Your caregiver will take a medical history and perform a physical exam. Further testing may be done for severe constipation. Some tests may include:   A barium enema X-ray to examine your rectum, colon, and sometimes, your small intestine.  A sigmoidoscopy to examine your lower  colon.  A colonoscopy to examine your entire colon. TREATMENT  Treatment will depend on the severity of your constipation and what is causing it. Some dietary treatments include drinking more fluids and eating more fiber-rich foods. Lifestyle treatments may include regular exercise. If these diet and lifestyle recommendations do not help, your caregiver may recommend taking over-the-counter laxative medicines to help you have bowel movements. Prescription medicines may be prescribed if over-the-counter medicines do not work.  HOME CARE INSTRUCTIONS   Increase dietary fiber in your diet, such as fruits, vegetables, whole grains, and beans. Limit high-fat and processed sugars in your diet, such as [place] fries, hamburgers, cookies, candies, and soda.   A fiber supplement may be added to your diet if you cannot get enough fiber from foods.   Drink enough fluids to keep your urine clear or pale yellow.   Exercise regularly or as directed by your caregiver.   Go to the restroom when you have the urge to go. Do not hold it.  Only take medicines as directed by your caregiver. Do not take other medicines for constipation without talking to your caregiver first. [place] IF:   You have bright red blood in your stool.   Your constipation lasts for more than 4 days or gets worse.   You have abdominal or rectal pain.   You have thin, pencil-like stools.  You have unexplained weight loss. MAKE SURE YOU:   Understand these instructions.  Will watch your condition.  Will get help right away if you are not doing well or get worse. Document Released: 11/12/2003 Document Revised: 05/08/2011 Document Reviewed: 11/25/2012 [facility] Patient Information 2014 [place], [place].

## 2013-07-06 NOTE — ED Notes (Signed)
Pt ambulated to BR w/o assistance

## 2013-07-06 NOTE — ED Notes (Signed)
Pt states abdominal pain for 10 days.  Abdominal cramping and swelling.  Was seen by MD on Monday.  UA and xray done.  Pt was told to take laxative.  Yesterday started with fever.  Pain has increased.

## 2013-07-06 NOTE — ED Provider Notes (Signed)
Medical screening examination/treatment/procedure(s) were conducted as a shared visit with non-physician practitioner(s) and myself.  I personally evaluated the patient during the encounter.   EKG Interpretation None     Patient here with abdominal pain and CT is consistent with colitis. Will give patient Cipro and Flagyl and swath enema and return precautions given.  Leota Jacobsen, MD 07/06/13 [phone]

## 2013-07-06 NOTE — ED Notes (Signed)
Pt from home reports L side abd pain with bloating x10 days. Pt saw PCP and was told to take laxatives which did not relieve pain. Pt reports last BM was this am and was normal. Pt denies blood in urine or stool. Pt denies urinary s/sx. Pt states that is able to eat and drink, but not like normal. Pt also denies SOB or CP. Pt lung sounds clear bilaterally and has hypoactive BS in all 4 quads. Pt is A&O and in NAD

## 2013-07-06 NOTE — ED Provider Notes (Signed)
CSN: [id number]     Arrival date & time 07/06/13  1212 History   First MD Initiated Contact with Patient 07/06/13 1255     Chief Complaint  Patient presents with  . Abdominal Pain     (Consider location/radiation/quality/duration/timing/severity/associated sxs/prior Treatment) HPI Patient presents with 10 days of lower abdominal pain, bloating, constipation.  Pain is crampy and constant but waxes and wanes, exacerbated by eating. Developed fever of 102.2. Last night.  Was seen by PCP 06/30/13 with abdominal xray showing large stool burden.  She took milk of magnesia and Miralax for several days but states this made her symptoms worse.   She has taken tylenol and ibuprofen with temporary relief.  She did have a few loose stools followed by two small formed stools yesterday and today.  She is able to pass flatus.  Denies urinary or vaginal symptoms.  Past abdominal surgery:  Oophorectomy for cyst.  Had colonoscopy 10 years ago that showed diverticulosis but nothing else.    Past Medical History  Diagnosis Date  . Hypertension   . Depression    Past Surgical History  Procedure Laterality Date  . Oophorectomy    . Cardiac catheterization     History reviewed. No pertinent family history. History  Substance Use Topics  . Smoking status: Former Research scientist (life sciences)  . Smokeless tobacco: Not on file  . Alcohol Use: Yes     Comment: social   OB History   Grav Para Term Preterm Abortions TAB SAB Ect Mult Living                 Review of Systems  Constitutional: Negative for fever.  Respiratory: Negative for cough and shortness of breath.   Cardiovascular: Negative for chest pain.  Gastrointestinal: Positive for abdominal pain and constipation. Negative for nausea, vomiting, diarrhea and blood in stool.  Genitourinary: Negative for dysuria, urgency, frequency, vaginal bleeding and vaginal discharge.  All other systems reviewed and are negative.     Allergies  Erythromycin base; Codeine; and  Penicillins  Home Medications   Prior to Admission medications   Medication Sig Start Date End Date Taking? Authorizing Provider  acetaminophen (TYLENOL) 500 MG tablet Take 1,000 mg by mouth every 6 (six) hours as needed for mild pain.   Yes Historical Provider, MD  ALPRAZolam Duanne Moron) 0.5 MG tablet Take 0.125-0.25 mg by mouth at bedtime as needed for anxiety.   Yes Historical Provider, MD  aspirin EC 81 MG tablet Take 81 mg by mouth daily.   Yes Historical Provider, MD  citalopram (CELEXA) 10 MG tablet Take 5 mg by mouth every other day.   Yes Historical Provider, MD  ibuprofen (ADVIL,MOTRIN) 200 MG tablet Take 400 mg by mouth every 6 (six) hours as needed for fever or moderate pain.   Yes Historical Provider, MD  metoprolol succinate (TOPROL-XL) 25 MG 24 hr tablet Take 37.5 mg by mouth daily.   Yes Historical Provider, MD  Multiple Vitamin (MULTIVITAMIN WITH MINERALS) TABS tablet Take 1 tablet by mouth daily.   Yes Historical Provider, MD  zolpidem (AMBIEN) 10 MG tablet Take 3.5 mg by mouth at bedtime as needed for sleep.   Yes Historical Provider, MD   BP 117/68  Pulse 72  Temp(Src) 98.3 F (36.8 C) (Oral)  Resp 20  SpO2 96% Physical Exam  Nursing note and vitals reviewed. Constitutional: She appears well-developed and well-nourished. No distress.  HENT:  Head: Normocephalic and atraumatic.  Neck: Neck supple.  Cardiovascular: Normal rate and regular  rhythm.   Pulmonary/Chest: Effort normal and breath sounds normal. No respiratory distress. She has no wheezes. She has no rales.  Abdominal: Soft. Bowel sounds are normal. She exhibits no distension and no mass. There is tenderness in the right lower quadrant, suprapubic area and left lower quadrant. There is no rebound and no guarding.  significant lower abdominal tenderness  Genitourinary: Rectum normal. Rectal exam shows no external hemorrhoid, no internal hemorrhoid, no fissure, no mass and anal tone normal.  Brown stool on glove.   No gross blood.  No stool impaction.   Neurological: She is alert.  Skin: She is not diaphoretic.    ED Course  Procedures (including critical care time) Labs Review Labs Reviewed  CBC WITH DIFFERENTIAL - Abnormal; Notable for the following:    Neutrophils Relative % 80 (*)    Neutro Abs 8.1 (*)    Lymphocytes Relative 10 (*)    All other components within normal limits  COMPREHENSIVE METABOLIC PANEL - Abnormal; Notable for the following:    Potassium 3.5 (*)    Glucose, Bld 106 (*)    AST 76 (*)    ALT 57 (*)    Alkaline Phosphatase 142 (*)    All other components within normal limits  URINALYSIS, ROUTINE W REFLEX MICROSCOPIC - Abnormal; Notable for the following:    Color, Urine AMBER (*)    APPearance CLOUDY (*)    Specific Gravity, Urine 1.038 (*)    Bilirubin Urine SMALL (*)    Ketones, ur 15 (*)    All other components within normal limits  LIPASE, BLOOD  POC OCCULT BLOOD, ED    Imaging Review Ct Abdomen Pelvis W Contrast  07/06/2013   CLINICAL DATA:  Lower abdominal pain with constipation and fever 10 days.  EXAM: CT ABDOMEN AND PELVIS WITH CONTRAST  TECHNIQUE: Multidetector CT imaging of the abdomen and pelvis was performed using the standard protocol following bolus administration of intravenous contrast.  CONTRAST:  58mL OMNIPAQUE IOHEXOL 300 MG/ML SOLN, 173mL OMNIPAQUE IOHEXOL 300 MG/ML SOLN  COMPARISON:  [facility] 06/30/2013  FINDINGS: Lung bases are within normal.  Abdominal images demonstrate an 8 mm peripheral hypodensity over the right lobe of the liver with subtle nodular peripheral enhancement suggesting a small hemangioma. The spleen, pancreas, gallbladder and adrenal glands are within normal. Kidneys are normal size without hydronephrosis or nephrolithiasis. Ureters are unremarkable. Abdominal aorta is normal. There is mild amount of fluid in the pericolic gutters bilaterally. Appendix is normal in caliber with air filling distally.  There is moderate to severe fecal  retention throughout the colon. There are no dilated small bowel loops. There is no free peritoneal air. There is mild wall thickening with adjacent inflammation/ fluid involving the rectosigmoid colon likely due to an acute colitis which may be infectious or inflammatory origin. Remaining pelvic structures are within normal. Remainder of the exam is unremarkable.  IMPRESSION: Moderate to severe fecal retention throughout the colon. Mild wall thickening with minimal adjacent inflammatory change involving the rectosigmoid colon. Mild free fluid in the pelvis and pericolic gutters. Findings are likely due to an acute colitis which may be of infectious or inflammatory etiology.  8 mm hypodensity over the right lobe of the liver liver likely a small hemangioma.   Electronically Signed   By: Marin Olp M.D.   On: 07/06/2013 14:44     EKG Interpretation None      1:45 PM Pt declines pain medication   2:57 PM Discussed patient and CT  result with Dr Zenia Resides who will also see the patient.    MDM   Final diagnoses:  Acute colitis  Constipation    Pt with 10 days of lower abdominal pain, bloating, constipation.  No stool impaction.  CT shows retained stool and acute colitis.  Plan for d/c home with cipro, flagyl, pain medication, PCP follow up. Dr Zenia Resides discussed result, findings, treatment, and follow up  with patient.  Pt given return precautions.  Pt verbalizes understanding and agrees with plan.        Clayton Bibles, PA-C 07/06/13 1531

## 2013-07-07 LAB — POC OCCULT BLOOD, ED: FECAL OCCULT BLD: NEGATIVE

## 2013-07-09 ENCOUNTER — Emergency Department (HOSPITAL_COMMUNITY): Payer: Managed Care, Other (non HMO) | Admitting: Certified Registered Nurse Anesthetist

## 2013-07-09 ENCOUNTER — Encounter (HOSPITAL_COMMUNITY): Payer: Self-pay | Admitting: Emergency Medicine

## 2013-07-09 ENCOUNTER — Encounter (HOSPITAL_COMMUNITY): Admission: EM | Disposition: A | Discharge status: Home Health | Payer: Self-pay | Source: Home / Self Care

## 2013-07-09 ENCOUNTER — Encounter (HOSPITAL_COMMUNITY): Payer: Managed Care, Other (non HMO) | Admitting: Certified Registered Nurse Anesthetist

## 2013-07-09 ENCOUNTER — Emergency Department (HOSPITAL_COMMUNITY): Payer: Managed Care, Other (non HMO)

## 2013-07-09 ENCOUNTER — Inpatient Hospital Stay (HOSPITAL_COMMUNITY)
Admission: EM | Admit: 2013-07-09 | Discharge: 2013-07-25 | DRG: 329 | Disposition: A | Discharge status: Home Health | Payer: Managed Care, Other (non HMO) | Attending: General Surgery | Admitting: General Surgery

## 2013-07-09 DIAGNOSIS — K631 Perforation of intestine (nontraumatic): Secondary | ICD-10-CM | POA: Diagnosis present

## 2013-07-09 DIAGNOSIS — K5732 Diverticulitis of large intestine without perforation or abscess without bleeding: Secondary | ICD-10-CM | POA: Diagnosis present

## 2013-07-09 DIAGNOSIS — Z803 Family history of malignant neoplasm of breast: Secondary | ICD-10-CM

## 2013-07-09 DIAGNOSIS — F411 Generalized anxiety disorder: Secondary | ICD-10-CM | POA: Diagnosis present

## 2013-07-09 DIAGNOSIS — N731 Chronic parametritis and pelvic cellulitis: Secondary | ICD-10-CM | POA: Diagnosis present

## 2013-07-09 DIAGNOSIS — R002 Palpitations: Secondary | ICD-10-CM | POA: Diagnosis present

## 2013-07-09 DIAGNOSIS — Z885 Allergy status to narcotic agent status: Secondary | ICD-10-CM

## 2013-07-09 DIAGNOSIS — K9189 Other postprocedural complications and disorders of digestive system: Secondary | ICD-10-CM

## 2013-07-09 DIAGNOSIS — R000 Tachycardia, unspecified: Secondary | ICD-10-CM | POA: Diagnosis not present

## 2013-07-09 DIAGNOSIS — Z833 Family history of diabetes mellitus: Secondary | ICD-10-CM

## 2013-07-09 DIAGNOSIS — Z7982 Long term (current) use of aspirin: Secondary | ICD-10-CM

## 2013-07-09 DIAGNOSIS — K5641 Fecal impaction: Secondary | ICD-10-CM | POA: Diagnosis present

## 2013-07-09 DIAGNOSIS — I059 Rheumatic mitral valve disease, unspecified: Secondary | ICD-10-CM | POA: Diagnosis present

## 2013-07-09 DIAGNOSIS — J90 Pleural effusion, not elsewhere classified: Secondary | ICD-10-CM | POA: Diagnosis not present

## 2013-07-09 DIAGNOSIS — K5289 Other specified noninfective gastroenteritis and colitis: Secondary | ICD-10-CM | POA: Diagnosis present

## 2013-07-09 DIAGNOSIS — K560 Paralytic ileus: Secondary | ICD-10-CM | POA: Diagnosis not present

## 2013-07-09 DIAGNOSIS — D72829 Elevated white blood cell count, unspecified: Secondary | ICD-10-CM | POA: Diagnosis not present

## 2013-07-09 DIAGNOSIS — K5900 Constipation, unspecified: Secondary | ICD-10-CM | POA: Diagnosis present

## 2013-07-09 DIAGNOSIS — T8143XA Infection following a procedure, organ and space surgical site, initial encounter: Secondary | ICD-10-CM

## 2013-07-09 DIAGNOSIS — K650 Generalized (acute) peritonitis: Secondary | ICD-10-CM | POA: Diagnosis present

## 2013-07-09 DIAGNOSIS — K929 Disease of digestive system, unspecified: Secondary | ICD-10-CM | POA: Diagnosis not present

## 2013-07-09 DIAGNOSIS — F329 Major depressive disorder, single episode, unspecified: Secondary | ICD-10-CM | POA: Diagnosis present

## 2013-07-09 DIAGNOSIS — Z880 Allergy status to penicillin: Secondary | ICD-10-CM

## 2013-07-09 DIAGNOSIS — I10 Essential (primary) hypertension: Secondary | ICD-10-CM | POA: Diagnosis present

## 2013-07-09 DIAGNOSIS — R188 Other ascites: Secondary | ICD-10-CM | POA: Diagnosis not present

## 2013-07-09 DIAGNOSIS — Y836 Removal of other organ (partial) (total) as the cause of abnormal reaction of the patient, or of later complication, without mention of misadventure at the time of the procedure: Secondary | ICD-10-CM | POA: Diagnosis not present

## 2013-07-09 DIAGNOSIS — Z800 Family history of malignant neoplasm of digestive organs: Secondary | ICD-10-CM

## 2013-07-09 DIAGNOSIS — K567 Ileus, unspecified: Secondary | ICD-10-CM | POA: Diagnosis not present

## 2013-07-09 DIAGNOSIS — E43 Unspecified severe protein-calorie malnutrition: Secondary | ICD-10-CM | POA: Diagnosis present

## 2013-07-09 DIAGNOSIS — Z791 Long term (current) use of non-steroidal anti-inflammatories (NSAID): Secondary | ICD-10-CM

## 2013-07-09 DIAGNOSIS — Z79899 Other long term (current) drug therapy: Secondary | ICD-10-CM

## 2013-07-09 DIAGNOSIS — T8149XA Infection following a procedure, other surgical site, initial encounter: Secondary | ICD-10-CM

## 2013-07-09 DIAGNOSIS — K668 Other specified disorders of peritoneum: Secondary | ICD-10-CM

## 2013-07-09 DIAGNOSIS — Z87891 Personal history of nicotine dependence: Secondary | ICD-10-CM

## 2013-07-09 DIAGNOSIS — F3289 Other specified depressive episodes: Secondary | ICD-10-CM | POA: Diagnosis present

## 2013-07-09 DIAGNOSIS — K651 Peritoneal abscess: Secondary | ICD-10-CM | POA: Diagnosis not present

## 2013-07-09 DIAGNOSIS — R609 Edema, unspecified: Secondary | ICD-10-CM | POA: Diagnosis present

## 2013-07-09 HISTORY — DX: Anxiety disorder, unspecified: F41.9

## 2013-07-09 HISTORY — DX: Nonrheumatic mitral (valve) prolapse: I34.1

## 2013-07-09 HISTORY — PX: LAPAROTOMY: SHX154

## 2013-07-09 LAB — URINALYSIS, ROUTINE W REFLEX MICROSCOPIC
Glucose, UA: 100 mg/dL — AB
Hgb urine dipstick: NEGATIVE
Ketones, ur: 15 mg/dL — AB
NITRITE: POSITIVE — AB
PH: 5 (ref 5.0–8.0)
Protein, ur: 100 mg/dL — AB
SPECIFIC GRAVITY, URINE: 1.031 — AB (ref 1.005–1.030)
UROBILINOGEN UA: 2 mg/dL — AB (ref 0.0–1.0)

## 2013-07-09 LAB — LIPASE, BLOOD: LIPASE: 11 U/L (ref 11–59)

## 2013-07-09 LAB — CBC WITH DIFFERENTIAL/PLATELET
BASOS ABS: 0 10*3/uL (ref 0.0–0.1)
BASOS PCT: 0 % (ref 0–1)
EOS ABS: 0 10*3/uL (ref 0.0–0.7)
Eosinophils Relative: 0 % (ref 0–5)
HCT: 43.4 % (ref 36.0–46.0)
HEMOGLOBIN: 15.2 g/dL — AB (ref 12.0–15.0)
Lymphocytes Relative: 4 % — ABNORMAL LOW (ref 12–46)
Lymphs Abs: 0.5 10*3/uL — ABNORMAL LOW (ref 0.7–4.0)
MCH: 29.9 pg (ref 26.0–34.0)
MCHC: 35 g/dL (ref 30.0–36.0)
MCV: 85.4 fL (ref 78.0–100.0)
MONOS PCT: 6 % (ref 3–12)
Monocytes Absolute: 0.8 10*3/uL (ref 0.1–1.0)
NEUTROS ABS: 11.8 10*3/uL — AB (ref 1.7–7.7)
Neutrophils Relative %: 90 % — ABNORMAL HIGH (ref 43–77)
Platelets: 303 10*3/uL (ref 150–400)
RBC: 5.08 MIL/uL (ref 3.87–5.11)
RDW: 12.2 % (ref 11.5–15.5)
WBC: 13.2 10*3/uL — ABNORMAL HIGH (ref 4.0–10.5)

## 2013-07-09 LAB — TYPE AND SCREEN
ABO/RH(D): A POS
Antibody Screen: NEGATIVE

## 2013-07-09 LAB — COMPREHENSIVE METABOLIC PANEL
ALBUMIN: 2.9 g/dL — AB (ref 3.5–5.2)
ALT: 33 U/L (ref 0–35)
AST: 56 U/L — AB (ref 0–37)
Alkaline Phosphatase: 108 U/L (ref 39–117)
BUN: 26 mg/dL — AB (ref 6–23)
CHLORIDE: 93 meq/L — AB (ref 96–112)
CO2: 24 mEq/L (ref 19–32)
Calcium: 9.2 mg/dL (ref 8.4–10.5)
Creatinine, Ser: 0.94 mg/dL (ref 0.50–1.10)
GFR calc Af Amer: 73 mL/min — ABNORMAL LOW (ref >90)
GFR calc non Af Amer: 63 mL/min — ABNORMAL LOW (ref >90)
Glucose, Bld: 162 mg/dL — ABNORMAL HIGH (ref 70–99)
Potassium: 6 mEq/L — ABNORMAL HIGH (ref 3.7–5.3)
Sodium: 133 mEq/L — ABNORMAL LOW (ref 137–147)
TOTAL PROTEIN: 7.5 g/dL (ref 6.0–8.3)
Total Bilirubin: 1.7 mg/dL — ABNORMAL HIGH (ref 0.3–1.2)

## 2013-07-09 LAB — I-STAT CG4 LACTIC ACID, ED: Lactic Acid, Venous: 2.83 mmol/L — ABNORMAL HIGH (ref 0.5–2.2)

## 2013-07-09 LAB — URINE MICROSCOPIC-ADD ON

## 2013-07-09 LAB — POC URINE PREG, ED: PREG TEST UR: NEGATIVE

## 2013-07-09 LAB — MRSA PCR SCREENING: MRSA by PCR: NEGATIVE

## 2013-07-09 LAB — POTASSIUM: POTASSIUM: 3.6 meq/L — AB (ref 3.7–5.3)

## 2013-07-09 LAB — ABO/RH: ABO/RH(D): A POS

## 2013-07-09 SURGERY — LAPAROTOMY, EXPLORATORY
Anesthesia: General

## 2013-07-09 MED ORDER — FENTANYL CITRATE 0.05 MG/ML IJ SOLN
100.0000 ug | Freq: Once | INTRAMUSCULAR | Status: AC
  Administered 2013-07-09: 100 ug via INTRAVENOUS
  Filled 2013-07-09: qty 2

## 2013-07-09 MED ORDER — GLYCOPYRROLATE 0.2 MG/ML IJ SOLN
INTRAMUSCULAR | Status: AC
  Filled 2013-07-09: qty 3

## 2013-07-09 MED ORDER — LACTATED RINGERS IV SOLN
INTRAVENOUS | Status: DC | PRN
  Administered 2013-07-09 (×2): via INTRAVENOUS

## 2013-07-09 MED ORDER — LIDOCAINE HCL (CARDIAC) 20 MG/ML IV SOLN
INTRAVENOUS | Status: AC
  Filled 2013-07-09: qty 5

## 2013-07-09 MED ORDER — ROCURONIUM BROMIDE 100 MG/10ML IV SOLN
INTRAVENOUS | Status: DC | PRN
  Administered 2013-07-09: 25 mg via INTRAVENOUS

## 2013-07-09 MED ORDER — NEOSTIGMINE METHYLSULFATE 10 MG/10ML IV SOLN
INTRAVENOUS | Status: DC | PRN
  Administered 2013-07-09: 4 mg via INTRAVENOUS

## 2013-07-09 MED ORDER — METOCLOPRAMIDE HCL 5 MG/ML IJ SOLN
INTRAMUSCULAR | Status: AC
  Filled 2013-07-09: qty 2

## 2013-07-09 MED ORDER — PROPOFOL 10 MG/ML IV BOLUS
INTRAVENOUS | Status: DC | PRN
  Administered 2013-07-09: 110 mg via INTRAVENOUS

## 2013-07-09 MED ORDER — SODIUM CHLORIDE 0.9 % IV SOLN
1000.0000 mL | Freq: Once | INTRAVENOUS | Status: AC
  Administered 2013-07-09: 1000 mL via INTRAVENOUS

## 2013-07-09 MED ORDER — ONDANSETRON HCL 4 MG/2ML IJ SOLN
4.0000 mg | Freq: Once | INTRAMUSCULAR | Status: AC
  Administered 2013-07-09: 4 mg via INTRAVENOUS
  Filled 2013-07-09: qty 2

## 2013-07-09 MED ORDER — ONDANSETRON HCL 4 MG/2ML IJ SOLN
4.0000 mg | Freq: Four times a day (QID) | INTRAMUSCULAR | Status: DC | PRN
  Administered 2013-07-09 – 2013-07-14 (×7): 4 mg via INTRAVENOUS
  Filled 2013-07-09 (×7): qty 2

## 2013-07-09 MED ORDER — FENTANYL CITRATE 0.05 MG/ML IJ SOLN
INTRAMUSCULAR | Status: DC | PRN
  Administered 2013-07-09 (×5): 50 ug via INTRAVENOUS

## 2013-07-09 MED ORDER — MORPHINE SULFATE 2 MG/ML IJ SOLN: 2.0000 mg | INTRAMUSCULAR | Status: DC | PRN

## 2013-07-09 MED ORDER — FAMOTIDINE IN NACL 20-0.9 MG/50ML-% IV SOLN
20.0000 mg | Freq: Two times a day (BID) | INTRAVENOUS | Status: DC
  Administered 2013-07-09 – 2013-07-25 (×31): 20 mg via INTRAVENOUS
  Filled 2013-07-09 (×34): qty 50

## 2013-07-09 MED ORDER — DEXAMETHASONE SODIUM PHOSPHATE 10 MG/ML IJ SOLN
INTRAMUSCULAR | Status: DC | PRN
  Administered 2013-07-09: 10 mg via INTRAVENOUS

## 2013-07-09 MED ORDER — POTASSIUM CHLORIDE IN NACL 40-0.9 MEQ/L-% IV SOLN
INTRAVENOUS | Status: DC
  Administered 2013-07-09: 22:00:00 via INTRAVENOUS
  Filled 2013-07-09 (×3): qty 1000

## 2013-07-09 MED ORDER — PROPOFOL 10 MG/ML IV BOLUS
INTRAVENOUS | Status: AC
  Filled 2013-07-09: qty 20

## 2013-07-09 MED ORDER — HYDROMORPHONE HCL PF 1 MG/ML IJ SOLN
INTRAMUSCULAR | Status: AC
  Filled 2013-07-09: qty 1

## 2013-07-09 MED ORDER — METOCLOPRAMIDE HCL 5 MG/ML IJ SOLN
INTRAMUSCULAR | Status: DC | PRN
  Administered 2013-07-09: 10 mg via INTRAVENOUS

## 2013-07-09 MED ORDER — SUCCINYLCHOLINE CHLORIDE 20 MG/ML IJ SOLN
INTRAMUSCULAR | Status: DC | PRN
  Administered 2013-07-09: 100 mg via INTRAVENOUS

## 2013-07-09 MED ORDER — HYDROMORPHONE HCL PF 1 MG/ML IJ SOLN
0.2500 mg | INTRAMUSCULAR | Status: DC | PRN
Start: 2013-07-09 — End: 2013-07-09
  Administered 2013-07-09: 0.25 mg via INTRAVENOUS
  Administered 2013-07-09 (×2): 0.5 mg via INTRAVENOUS
  Administered 2013-07-09 (×3): 0.25 mg via INTRAVENOUS

## 2013-07-09 MED ORDER — NEOSTIGMINE METHYLSULFATE 10 MG/10ML IV SOLN
INTRAVENOUS | Status: AC
  Filled 2013-07-09: qty 1

## 2013-07-09 MED ORDER — GLYCOPYRROLATE 0.2 MG/ML IJ SOLN
INTRAMUSCULAR | Status: DC | PRN
  Administered 2013-07-09: 0.6 mg via INTRAVENOUS

## 2013-07-09 MED ORDER — PHENYLEPHRINE HCL 10 MG/ML IJ SOLN
INTRAMUSCULAR | Status: DC | PRN
  Administered 2013-07-09 (×2): 80 ug via INTRAVENOUS

## 2013-07-09 MED ORDER — MORPHINE SULFATE 2 MG/ML IJ SOLN
2.0000 mg | INTRAMUSCULAR | Status: DC | PRN
  Administered 2013-07-09 – 2013-07-11 (×8): 2 mg via INTRAVENOUS
  Administered 2013-07-11: 4 mg via INTRAVENOUS
  Administered 2013-07-12 – 2013-07-13 (×7): 2 mg via INTRAVENOUS
  Filled 2013-07-09: qty 2
  Filled 2013-07-09 (×4): qty 1
  Filled 2013-07-09: qty 2
  Filled 2013-07-09 (×8): qty 1
  Filled 2013-07-09: qty 2
  Filled 2013-07-09: qty 1

## 2013-07-09 MED ORDER — SODIUM CHLORIDE 0.9 % IV SOLN
1.0000 g | Freq: Once | INTRAVENOUS | Status: AC
  Administered 2013-07-09: 1 g via INTRAVENOUS
  Filled 2013-07-09: qty 1

## 2013-07-09 MED ORDER — DEXAMETHASONE SODIUM PHOSPHATE 10 MG/ML IJ SOLN
INTRAMUSCULAR | Status: AC
  Filled 2013-07-09: qty 1

## 2013-07-09 MED ORDER — SODIUM CHLORIDE 0.9 % IV BOLUS (SEPSIS)
2000.0000 mL | Freq: Once | INTRAVENOUS | Status: AC
  Administered 2013-07-09: 2000 mL via INTRAVENOUS

## 2013-07-09 MED ORDER — PROMETHAZINE HCL 25 MG/ML IJ SOLN: 6.2500 mg | INTRAMUSCULAR | Status: DC | PRN

## 2013-07-09 MED ORDER — FENTANYL CITRATE 0.05 MG/ML IJ SOLN
INTRAMUSCULAR | Status: AC
  Filled 2013-07-09: qty 5

## 2013-07-09 MED ORDER — ONDANSETRON HCL 4 MG/2ML IJ SOLN
INTRAMUSCULAR | Status: AC
  Filled 2013-07-09: qty 2

## 2013-07-09 MED ORDER — LIDOCAINE HCL (CARDIAC) 20 MG/ML IV SOLN
INTRAVENOUS | Status: DC | PRN
  Administered 2013-07-09: 100 mg via INTRAVENOUS

## 2013-07-09 MED ORDER — MIDAZOLAM HCL 5 MG/5ML IJ SOLN
INTRAMUSCULAR | Status: DC | PRN
  Administered 2013-07-09: 2 mg via INTRAVENOUS

## 2013-07-09 MED ORDER — POTASSIUM CHLORIDE IN NACL 20-0.9 MEQ/L-% IV SOLN
INTRAVENOUS | Status: AC
  Administered 2013-07-09 – 2013-07-15 (×12): via INTRAVENOUS
  Filled 2013-07-09 (×19): qty 1000

## 2013-07-09 MED ORDER — ONDANSETRON HCL 4 MG/2ML IJ SOLN
INTRAMUSCULAR | Status: DC | PRN
  Administered 2013-07-09: 4 mg via INTRAVENOUS

## 2013-07-09 MED ORDER — MIDAZOLAM HCL 2 MG/2ML IJ SOLN
INTRAMUSCULAR | Status: AC
  Filled 2013-07-09: qty 2

## 2013-07-09 MED ORDER — SODIUM CHLORIDE 0.9 % IV SOLN
1.0000 g | INTRAVENOUS | Status: DC
  Administered 2013-07-10 – 2013-07-23 (×14): 1 g via INTRAVENOUS
  Filled 2013-07-09 (×15): qty 1

## 2013-07-09 MED ORDER — METOPROLOL TARTRATE 1 MG/ML IV SOLN
5.0000 mg | Freq: Four times a day (QID) | INTRAVENOUS | Status: DC
  Administered 2013-07-10 – 2013-07-14 (×17): 5 mg via INTRAVENOUS
  Filled 2013-07-09 (×22): qty 5

## 2013-07-09 SURGICAL SUPPLY — 50 items
APPLICATOR COTTON TIP 6IN STRL (MISCELLANEOUS) ×1 IMPLANT
BARRIER SKIN 2 3/4 (OSTOMY) ×2 IMPLANT
BARRIER SKIN 2 3/4 INCH (OSTOMY) ×1
BARRIER SKIN OD2.25 2 3/4 FLNG (OSTOMY) IMPLANT
BLADE EXTENDED COATED 6.5IN (ELECTRODE) IMPLANT
BLADE HEX COATED 2.75 (ELECTRODE) ×3 IMPLANT
BRR SKN FLT 2.75X2.25 2 PC (OSTOMY) ×1
CANISTER SUCTION 2500CC (MISCELLANEOUS) ×3 IMPLANT
COVER MAYO STAND STRL (DRAPES) IMPLANT
DRAPE LAPAROSCOPIC ABDOMINAL (DRAPES) ×3 IMPLANT
DRAPE UTILITY XL STRL (DRAPES) ×2 IMPLANT
DRAPE WARM FLUID 44X44 (DRAPE) IMPLANT
ELECT REM PT RETURN 9FT ADLT (ELECTROSURGICAL) ×3
ELECTRODE REM PT RTRN 9FT ADLT (ELECTROSURGICAL) ×1 IMPLANT
GLOVE BIO SURGEON STRL SZ 6.5 (GLOVE) ×1 IMPLANT
GLOVE BIO SURGEONS STRL SZ 6.5 (GLOVE) ×1
GLOVE BIOGEL PI IND STRL 7.0 (GLOVE) ×1 IMPLANT
GLOVE BIOGEL PI INDICATOR 7.0 (GLOVE) ×4
GLOVE SS BIOGEL STRL SZ 7.5 (GLOVE) IMPLANT
GLOVE SUPERSENSE BIOGEL SZ 7.5 (GLOVE) ×4
GOWN STRL REUS W/TWL LRG LVL3 (GOWN DISPOSABLE) ×3 IMPLANT
GOWN STRL REUS W/TWL XL LVL3 (GOWN DISPOSABLE) ×2 IMPLANT
KIT BASIN OR (CUSTOM PROCEDURE TRAY) ×3 IMPLANT
LIGASURE IMPACT 36 18CM CVD LR (INSTRUMENTS) ×2 IMPLANT
NS IRRIG 1000ML POUR BTL (IV SOLUTION) ×15 IMPLANT
PACK GENERAL/GYN (CUSTOM PROCEDURE TRAY) ×3 IMPLANT
PAD ABD 8X10 STRL (GAUZE/BANDAGES/DRESSINGS) ×2 IMPLANT
RELOAD PROXIMATE 75MM BLUE (ENDOMECHANICALS) ×3 IMPLANT
RELOAD STAPLE 75 3.8 BLU REG (ENDOMECHANICALS) IMPLANT
SPONGE GAUZE 4X4 12PLY (GAUZE/BANDAGES/DRESSINGS) ×3 IMPLANT
SPONGE LAP 18X18 X RAY DECT (DISPOSABLE) ×4 IMPLANT
STAPLER PROXIMATE 75MM BLUE (STAPLE) ×2 IMPLANT
STAPLER VISISTAT 35W (STAPLE) ×1 IMPLANT
SUCTION POOLE TIP (SUCTIONS) ×2 IMPLANT
SUT NOVA NAB DX-16 0-1 5-0 T12 (SUTURE) ×2 IMPLANT
SUT PDS AB 1 CTX 36 (SUTURE) IMPLANT
SUT PDS AB 1 TP1 96 (SUTURE) ×6 IMPLANT
SUT SILK 2 0 (SUTURE) ×3
SUT SILK 2 0 SH CR/8 (SUTURE) ×2 IMPLANT
SUT SILK 2-0 18XBRD TIE 12 (SUTURE) IMPLANT
SUT SILK 3 0 (SUTURE) ×3
SUT SILK 3 0 SH CR/8 (SUTURE) ×2 IMPLANT
SUT SILK 3-0 18XBRD TIE 12 (SUTURE) IMPLANT
SUT VIC AB 3-0 SH 18 (SUTURE) ×4 IMPLANT
SUT VIC AB 3-0 SH 27 (SUTURE) ×6
SUT VIC AB 3-0 SH 27X BRD (SUTURE) IMPLANT
TAPE CLOTH SURG 4X10 WHT LF (GAUZE/BANDAGES/DRESSINGS) ×2 IMPLANT
TOWEL OR 17X26 10 PK STRL BLUE (TOWEL DISPOSABLE) ×6 IMPLANT
TRAY FOLEY CATH 14FRSI W/METER (CATHETERS) IMPLANT
YANKAUER SUCT BULB TIP NO VENT (SUCTIONS) ×2 IMPLANT

## 2013-07-09 NOTE — ED Notes (Signed)
GI at bedside

## 2013-07-09 NOTE — Transfer of Care (Signed)
Immediate Anesthesia Transfer of Care Note  Patient: Kristen Reyes  Procedure(s) Performed: Procedure(s) (LRB): EXPLORATORY LAPAROTOMY, COLOSTOMY, SIGMOID COLECTOMY, HARTMANS PROCEDURE (N/A)  Patient Location: PACU  Anesthesia Type: General  Level of Consciousness: sedated, patient cooperative and responds to stimulation  Airway & Oxygen Therapy: Patient Spontanous Breathing and Patient connected to face mask oxgen  Post-op Assessment: Report given to PACU RN and Post -op Vital signs reviewed and stable  Post vital signs: Reviewed and stable  Complications: No apparent anesthesia complications

## 2013-07-09 NOTE — ED Notes (Signed)
Pt c/o gen abd pain x 1 wk.  Was seen here on 5/10 for same and dx w/ colitis.  Was told to come back if she felt worse.  States that she vomited once today.

## 2013-07-09 NOTE — ED Notes (Signed)
md at bedside  Pt alert and oriented x4. Respirations even and unlabored, bilateral symmetrical rise and fall of chest. Skin warm and dry. In no acute distress. Denies needs.   

## 2013-07-09 NOTE — ED Notes (Signed)
Pt to xray

## 2013-07-09 NOTE — Op Note (Signed)
Preoperative Diagnosis: perforation of sigmoid colon  Postoprative Diagnosis: same  Procedure: Procedure(s): EXPLORATORY LAPAROTOMY, COLOSTOMY, SIGMOID COLECTOMY, HARTMANS PROCEDURE   Surgeon: Excell Seltzer T   Assistants: Henri Medal  Anesthesia:  General endotracheal anesthesia  Indications: patient is a 63 year old female with an approximately ten-day history of severe constipation. This was not responding to laxatives and she developed left lower quadrant pain about 3 days ago. At that time CT scan was performed in an emergency department visit showing some evidence of colitis in the sigmoid colon and fecal impaction of the colon. She was started on outpatient antibiotics but developed worsening more generalized lower abdominal pain over the last 24 hours and presented back to the emergency department. She was found to have free air on her plain abdominal x-rays and evidence of peritonitis on exam. I recommended proceeding with emergency exploratory laparotomy and very likely colectomy with colostomy. I discussed the indications for the surgery this nature and recovery and risks of anesthetic complications, bleeding, infection, wound healing and almost certain need for colostomy. The patient and her husband understand and agree to proceed    Procedure Detail:  Patient was brought to the operating room, placed in the supine position on the operating table, and general endotracheal anesthesia induced. Foley catheter was placed. Nasogastric tube was placed. The abdomen was widely sterilely prepped and draped. She was on broad-spectrum IV antibiotics. Patient timeout was performed and correct procedure verified. A low midline incision extending just up to The umbilicus was used to dissect carried out precipitating his tissue and midline fascia. There was feculent fluid free in the abdominal cavity and air on entering. There was peritonitis with exudate and matting of the loops of bowel.  Using careful blunt dissection loops of bowel were separated and the sigmoid colon was found to be massively distended with stool with a large perforation on the medial aspect of the mid sigmoid colon and a very large fecal impaction extending down into the pelvis. The more proximal bowel was somewhat distended and edematous but basically okay. The colon was mobilized dividing lateral peritoneal attachments. An area for proximal resection in the distal left colon was chosen and divided with the GIA 75 mm stapler. The mesentery of the sigmoid was then sequentially divided with the LigaSure device. Some bleeding points in the mesentery were suture ligated with 2-0 silk. The dissection was difficult due to the very large distended colon extending down into the pelvis and filling it. However we were able to mobilize this up out of the pelvis and find very decompressed normal-appearing distal sigmoid or rectosigmoid about 7 cm above the pelvic floor and this was freed of mesentery and divided with the GIA 75 mm stapler. The remainder of the distal mesentery was then divided with the LigaSure and the specimen removed. There was some bleeding along the sacral promontory was controlled with a few figure-of-eight sutures of 2-0 silk. The pelvis was packed. The abdomen was then thoroughly irrigated with multiple liters of warm saline. There was basically a large walled off cavity from the perforation which had been walled off by the cecum and loops of small bowel and the uterus. With a lot of exudate here that was debrided somewhat and thoroughly irrigated. There was less severe peritonitis and among other loops of bowel and in the upper abdomen with some feculent fluid and this was thoroughly irrigated with multiple liters until clear. The pelvis was then inspected and hemostasis appeared complete. The rectosigmoid stump was marked with  a Novafil suture. A point for colostomy was chosen in the left midabdomen and a skin  button excised and the fascia incised in a cruciate fashion. The rectus was bluntly split and the peritoneum opened and dilated. The left colon was mobilized a little bit further dividing lateral peritoneal attachments and then was able to be brought out without any tension as an end colostomy. The viscera were returned to their anatomic position. The midline fascia was closed with running looped #1 PDS begun Frieder the incision and tied centrally with intermittent internal retention sutures of #1 Novafil. The colostomy was opened and matured with 3-0 Vicryl. It was very healthy. The midline wound was packed with saline gauze. Sponge needle and instrument counts were correct.    Findings: As above  Estimated Blood Loss:  300 mL         Drains: none  Blood Given: none          Specimens: sigmoid colon        Complications:  * No complications entered in OR log *         Disposition: PACU - guarded condition.         Condition: stable

## 2013-07-09 NOTE — ED Provider Notes (Signed)
CSN: [id number]     Arrival date & time 07/09/13  1300 History   First MD Initiated Contact with Patient 07/09/13 1455     Chief Complaint  Patient presents with  . Abdominal Pain     (Consider location/radiation/quality/duration/timing/severity/associated sxs/prior Treatment) HPI Complains of diffuse abdominal pain onset approximately 12 days ago. Accompanied by vomiting and fever. Patient vomited twice today. Last bowel movement was today, normal. She has had diarrhea over the past several days. She was seen here on  Past Medical History  Diagnosis Date  . Hypertension   . Depression    Past Surgical History  Procedure Laterality Date  . Oophorectomy    . Cardiac catheterization     No family history on file. History  Substance Use Topics  . Smoking status: Former Research scientist (life sciences)  . Smokeless tobacco: Not on file  . Alcohol Use: Yes     Comment: social   OB History   Grav Para Term Preterm Abortions TAB SAB Ect Mult Living                 history of present illness (continued) she was seen here on 07/06/2013 determined to have colitis after having had a CT scan of her abdomen prescribed Flagyl, Cipro and Norco which he is taken however she continues to feel severe abdominal pain coming to subjective fevers. Other associated symptoms include diminished appetite and generalized weakness. Review of Systems  Constitutional: Positive for fever.  HENT: Negative.   Respiratory: Negative.   Cardiovascular: Negative.   Gastrointestinal: Positive for vomiting and abdominal pain.  Musculoskeletal: Negative.   Skin: Negative.   Neurological: Negative.   Psychiatric/Behavioral: Negative.   All other systems reviewed and are negative.     Allergies  Erythromycin base; Codeine; and Penicillins  Home Medications   Prior to Admission medications   Medication Sig Start Date End Date Taking? Authorizing Provider  acetaminophen (TYLENOL) 500 MG tablet Take 1,000 mg by mouth every 6  (six) hours as needed for mild pain.   Yes Historical Provider, MD  ALPRAZolam Duanne Moron) 0.5 MG tablet Take 0.125-0.25 mg by mouth at bedtime as needed for anxiety.   Yes Historical Provider, MD  aspirin EC 81 MG tablet Take 81 mg by mouth daily.   Yes Historical Provider, MD  ciprofloxacin (CIPRO) 500 MG tablet Take 1 tablet (500 mg total) by mouth 2 (two) times daily. 07/06/13  Yes Clayton Bibles, PA-C  citalopram (CELEXA) 10 MG tablet Take 5 mg by mouth every other day.   Yes Historical Provider, MD  HYDROcodone-acetaminophen (NORCO/VICODIN) 5-325 MG per tablet Take 1 tablet by mouth every 4 (four) hours as needed for moderate pain or severe pain. 07/06/13  Yes Clayton Bibles, PA-C  ibuprofen (ADVIL,MOTRIN) 200 MG tablet Take 400 mg by mouth every 6 (six) hours as needed for fever or moderate pain.   Yes Historical Provider, MD  metoprolol succinate (TOPROL-XL) 25 MG 24 hr tablet Take 37.5 mg by mouth daily.   Yes Historical Provider, MD  metroNIDAZOLE (FLAGYL) 500 MG tablet Take 1 tablet (500 mg total) by mouth 3 (three) times daily. One po bid x 7 days 07/06/13  Yes Clayton Bibles, PA-C  Multiple Vitamin (MULTIVITAMIN WITH MINERALS) TABS tablet Take 1 tablet by mouth daily.   Yes Historical Provider, MD  zolpidem (AMBIEN) 10 MG tablet Take 3.5 mg by mouth at bedtime as needed for sleep.   Yes Historical Provider, MD   BP 108/74  Pulse 110  Temp(Src) 98.3  F (36.8 C) (Oral)  Resp 16  SpO2 97% Physical Exam  Nursing note and vitals reviewed. Constitutional: She appears well-developed and well-nourished.  HENT:  Head: Normocephalic and atraumatic.  Mucous membranes dry  Eyes: Conjunctivae are normal. Pupils are equal, round, and reactive to light.  Neck: Neck supple. No tracheal deviation present. No thyromegaly present.  Cardiovascular: Regular rhythm.   No murmur heard. Tachycardic  Pulmonary/Chest: Effort normal and breath sounds normal.  Abdominal: Soft. Bowel sounds are normal. She exhibits  distension. There is tenderness.  Hyperactive bowel sounds. Tender to percussion  Musculoskeletal: Normal range of motion. She exhibits no edema and no tenderness.  Neurological: She is alert. Coordination normal.  Skin: Skin is warm and dry. No rash noted.  Psychiatric: She has a normal mood and affect.    ED Course  Procedures (including critical care time) Labs Review Labs Reviewed  CBC WITH DIFFERENTIAL - Abnormal; Notable for the following:    WBC 13.2 (*)    Hemoglobin 15.2 (*)    Neutrophils Relative % 90 (*)    Neutro Abs 11.8 (*)    Lymphocytes Relative 4 (*)    Lymphs Abs 0.5 (*)    All other components within normal limits  COMPREHENSIVE METABOLIC PANEL - Abnormal; Notable for the following:    Sodium 133 (*)    Potassium 6.0 (*)    Chloride 93 (*)    Glucose, Bld 162 (*)    BUN 26 (*)    Albumin 2.9 (*)    AST 56 (*)    Total Bilirubin 1.7 (*)    GFR calc non Af Amer 63 (*)    GFR calc Af Amer 73 (*)    All other components within normal limits  URINALYSIS, ROUTINE W REFLEX MICROSCOPIC - Abnormal; Notable for the following:    Color, Urine BROWN (*)    APPearance CLOUDY (*)    Specific Gravity, Urine 1.031 (*)    Glucose, UA 100 (*)    Bilirubin Urine MODERATE (*)    Ketones, ur 15 (*)    Protein, ur 100 (*)    Urobilinogen, UA 2.0 (*)    Nitrite POSITIVE (*)    Leukocytes, UA SMALL (*)    All other components within normal limits  URINE MICROSCOPIC-ADD ON - Abnormal; Notable for the following:    Squamous Epithelial / LPF FEW (*)    Bacteria, UA MANY (*)    Casts HYALINE CASTS (*)    All other components within normal limits  LIPASE, BLOOD  POTASSIUM  POC URINE PREG, ED    Imaging Review No results found.   EKG Interpretation None     4:15 PM patient feels much improved after treatment with intravenous fluids and intravenous fentanyl. Pain is now 100-110. She is resting comfortably. X-rays viewed by me and discussed with radiologist Results  for orders placed during the hospital encounter of 07/09/13  CBC WITH DIFFERENTIAL      Result Value Ref Range   WBC 13.2 (*) 4.0 - 10.5 K/uL   RBC 5.08  3.87 - 5.11 MIL/uL   Hemoglobin 15.2 (*) 12.0 - 15.0 g/dL   HCT 43.4  36.0 - 46.0 %   MCV 85.4  78.0 - 100.0 fL   MCH 29.9  26.0 - 34.0 pg   MCHC 35.0  30.0 - 36.0 g/dL   RDW 12.2  11.5 - 15.5 %   Platelets 303  150 - 400 K/uL   Neutrophils Relative % 90 (*)  43 - 77 %   Neutro Abs 11.8 (*) 1.7 - 7.7 K/uL   Lymphocytes Relative 4 (*) 12 - 46 %   Lymphs Abs 0.5 (*) 0.7 - 4.0 K/uL   Monocytes Relative 6  3 - 12 %   Monocytes Absolute 0.8  0.1 - 1.0 K/uL   Eosinophils Relative 0  0 - 5 %   Eosinophils Absolute 0.0  0.0 - 0.7 K/uL   Basophils Relative 0  0 - 1 %   Basophils Absolute 0.0  0.0 - 0.1 K/uL  COMPREHENSIVE METABOLIC PANEL      Result Value Ref Range   Sodium 133 (*) 137 - 147 mEq/L   Potassium 6.0 (*) 3.7 - 5.3 mEq/L   Chloride 93 (*) 96 - 112 mEq/L   CO2 24  19 - 32 mEq/L   Glucose, Bld 162 (*) 70 - 99 mg/dL   BUN 26 (*) 6 - 23 mg/dL   Creatinine, Ser 0.94  0.50 - 1.10 mg/dL   Calcium 9.2  8.4 - 10.5 mg/dL   Total Protein 7.5  6.0 - 8.3 g/dL   Albumin 2.9 (*) 3.5 - 5.2 g/dL   AST 56 (*) 0 - 37 U/L   ALT 33  0 - 35 U/L   Alkaline Phosphatase 108  39 - 117 U/L   Total Bilirubin 1.7 (*) 0.3 - 1.2 mg/dL   GFR calc non Af Amer 63 (*) >90 mL/min   GFR calc Af Amer 73 (*) >90 mL/min  LIPASE, BLOOD      Result Value Ref Range   Lipase 11  11 - 59 U/L  URINALYSIS, ROUTINE W REFLEX MICROSCOPIC      Result Value Ref Range   Color, Urine BROWN (*) YELLOW   APPearance CLOUDY (*) CLEAR   Specific Gravity, Urine 1.031 (*) 1.005 - 1.030   pH 5.0  5.0 - 8.0   Glucose, UA 100 (*) NEGATIVE mg/dL   Hgb urine dipstick NEGATIVE  NEGATIVE   Bilirubin Urine MODERATE (*) NEGATIVE   Ketones, ur 15 (*) NEGATIVE mg/dL   Protein, ur 100 (*) NEGATIVE mg/dL   Urobilinogen, UA 2.0 (*) 0.0 - 1.0 mg/dL   Nitrite POSITIVE (*) NEGATIVE    Leukocytes, UA SMALL (*) NEGATIVE  URINE MICROSCOPIC-ADD ON      Result Value Ref Range   Squamous Epithelial / LPF FEW (*) RARE   WBC, UA 3-6  <3 WBC/hpf   Bacteria, UA MANY (*) RARE   Casts HYALINE CASTS (*) NEGATIVE   Urine-Other MUCOUS PRESENT    POTASSIUM      Result Value Ref Range   Potassium 3.6 (*) 3.7 - 5.3 mEq/L  POC URINE PREG, ED      Result Value Ref Range   Preg Test, Ur NEGATIVE  NEGATIVE  I-STAT CG4 LACTIC ACID, ED      Result Value Ref Range   Lactic Acid, Venous 2.83 (*) 0.5 - 2.2 mmol/L   Ct Abdomen Pelvis W Contrast  07/06/2013   CLINICAL DATA:  Lower abdominal pain with constipation and fever 10 days.  EXAM: CT ABDOMEN AND PELVIS WITH CONTRAST  TECHNIQUE: Multidetector CT imaging of the abdomen and pelvis was performed using the standard protocol following bolus administration of intravenous contrast.  CONTRAST:  81mL OMNIPAQUE IOHEXOL 300 MG/ML SOLN, 110mL OMNIPAQUE IOHEXOL 300 MG/ML SOLN  COMPARISON:  KUB 06/30/2013  FINDINGS: Lung bases are within normal.  Abdominal images demonstrate an 8 mm peripheral hypodensity over the right  lobe of the liver with subtle nodular peripheral enhancement suggesting a small hemangioma. The spleen, pancreas, gallbladder and adrenal glands are within normal. Kidneys are normal size without hydronephrosis or nephrolithiasis. Ureters are unremarkable. Abdominal aorta is normal. There is mild amount of fluid in the pericolic gutters bilaterally. Appendix is normal in caliber with air filling distally.  There is moderate to severe fecal retention throughout the colon. There are no dilated small bowel loops. There is no free peritoneal air. There is mild wall thickening with adjacent inflammation/ fluid involving the rectosigmoid colon likely due to an acute colitis which may be infectious or inflammatory origin. Remaining pelvic structures are within normal. Remainder of the exam is unremarkable.  IMPRESSION: Moderate to severe fecal retention  throughout the colon. Mild wall thickening with minimal adjacent inflammatory change involving the rectosigmoid colon. Mild free fluid in the pelvis and pericolic gutters. Findings are likely due to an acute colitis which may be of infectious or inflammatory etiology.  8 mm hypodensity over the right lobe of the liver liver likely a small hemangioma.   Electronically Signed   By: Marin Olp M.D.   On: 07/06/2013 14:44   Dg Abd 2 Views  06/30/2013   CLINICAL DATA:  Umbilical pain.  EXAM: ABDOMEN - 2 VIEW  COMPARISON:  None.  FINDINGS: Large stool burden throughout the colon. There is normal bowel gas pattern. No free air. No organomegaly or suspicious calcification. No acute bony abnormality.  IMPRESSION: Large stool burden suggesting constipation. Recommend clinical correlation.   Electronically Signed   By: Rolm Baptise M.D.   On: 06/30/2013 17:07   Dg Abd Acute W/chest  07/09/2013   CLINICAL DATA:  Lower abdominal pain.  EXAM: ACUTE ABDOMEN SERIES (ABDOMEN 2 VIEW & CHEST 1 VIEW)  COMPARISON:  CT ABD/PELVIS W CM dated 07/06/2013  FINDINGS: There is free extraluminal gas under the hemidiaphragms consistent with a perforated viscus. The bowel gas pattern is relatively nonspecific. There is a moderate amount of contrast present within the colon. There are loops of mildly distended gas-filled small bowel in the midline. There are no abnormal soft tissue calcifications within the abdomen. The chest reveals the lungs to be mildly hyperinflated and clear. The cardiac silhouette is normal in size. There is no pleural effusion.  IMPRESSION: Free extraluminal gas is consistent with a perforated viscus. This may be related to the suspected acute colitis. These results were called by telephone at the time of interpretation on 07/09/2013 at 3:55 PM to Dr. Orlie Dakin , who verbally acknowledged these results.   Electronically Signed   By: David  Martinique   On: 07/09/2013 15:56    MDM   Clinincally pt has surgical  abdomen.  General surgery consulted. Invanz intravenously ordered at surgeon's request in preparation for surgery . Final diagnoses:  None   Diagnosis#1 acute pneumoperitoneum #2hyperglycemia Results for orders placed during the hospital encounter of 07/09/13  CBC WITH DIFFERENTIAL      Result Value Ref Range   WBC 13.2 (*) 4.0 - 10.5 K/uL   RBC 5.08  3.87 - 5.11 MIL/uL   Hemoglobin 15.2 (*) 12.0 - 15.0 g/dL   HCT 43.4  36.0 - 46.0 %   MCV 85.4  78.0 - 100.0 fL   MCH 29.9  26.0 - 34.0 pg   MCHC 35.0  30.0 - 36.0 g/dL   RDW 12.2  11.5 - 15.5 %   Platelets 303  150 - 400 K/uL   Neutrophils Relative % 90 (*)  43 - 77 %   Neutro Abs 11.8 (*) 1.7 - 7.7 K/uL   Lymphocytes Relative 4 (*) 12 - 46 %   Lymphs Abs 0.5 (*) 0.7 - 4.0 K/uL   Monocytes Relative 6  3 - 12 %   Monocytes Absolute 0.8  0.1 - 1.0 K/uL   Eosinophils Relative 0  0 - 5 %   Eosinophils Absolute 0.0  0.0 - 0.7 K/uL   Basophils Relative 0  0 - 1 %   Basophils Absolute 0.0  0.0 - 0.1 K/uL  COMPREHENSIVE METABOLIC PANEL      Result Value Ref Range   Sodium 133 (*) 137 - 147 mEq/L   Potassium 6.0 (*) 3.7 - 5.3 mEq/L   Chloride 93 (*) 96 - 112 mEq/L   CO2 24  19 - 32 mEq/L   Glucose, Bld 162 (*) 70 - 99 mg/dL   BUN 26 (*) 6 - 23 mg/dL   Creatinine, Ser 0.94  0.50 - 1.10 mg/dL   Calcium 9.2  8.4 - 10.5 mg/dL   Total Protein 7.5  6.0 - 8.3 g/dL   Albumin 2.9 (*) 3.5 - 5.2 g/dL   AST 56 (*) 0 - 37 U/L   ALT 33  0 - 35 U/L   Alkaline Phosphatase 108  39 - 117 U/L   Total Bilirubin 1.7 (*) 0.3 - 1.2 mg/dL   GFR calc non Af Amer 63 (*) >90 mL/min   GFR calc Af Amer 73 (*) >90 mL/min  LIPASE, BLOOD      Result Value Ref Range   Lipase 11  11 - 59 U/L  URINALYSIS, ROUTINE W REFLEX MICROSCOPIC      Result Value Ref Range   Color, Urine BROWN (*) YELLOW   APPearance CLOUDY (*) CLEAR   Specific Gravity, Urine 1.031 (*) 1.005 - 1.030   pH 5.0  5.0 - 8.0   Glucose, UA 100 (*) NEGATIVE mg/dL   Hgb urine dipstick NEGATIVE   NEGATIVE   Bilirubin Urine MODERATE (*) NEGATIVE   Ketones, ur 15 (*) NEGATIVE mg/dL   Protein, ur 100 (*) NEGATIVE mg/dL   Urobilinogen, UA 2.0 (*) 0.0 - 1.0 mg/dL   Nitrite POSITIVE (*) NEGATIVE   Leukocytes, UA SMALL (*) NEGATIVE  URINE MICROSCOPIC-ADD ON      Result Value Ref Range   Squamous Epithelial / LPF FEW (*) RARE   WBC, UA 3-6  <3 WBC/hpf   Bacteria, UA MANY (*) RARE   Casts HYALINE CASTS (*) NEGATIVE   Urine-Other MUCOUS PRESENT    POTASSIUM      Result Value Ref Range   Potassium 3.6 (*) 3.7 - 5.3 mEq/L  POC URINE PREG, ED      Result Value Ref Range   Preg Test, Ur NEGATIVE  NEGATIVE  I-STAT CG4 LACTIC ACID, ED      Result Value Ref Range   Lactic Acid, Venous 2.83 (*) 0.5 - 2.2 mmol/L   Ct Abdomen Pelvis W Contrast  07/06/2013   CLINICAL DATA:  Lower abdominal pain with constipation and fever 10 days.  EXAM: CT ABDOMEN AND PELVIS WITH CONTRAST  TECHNIQUE: Multidetector CT imaging of the abdomen and pelvis was performed using the standard protocol following bolus administration of intravenous contrast.  CONTRAST:  31mL OMNIPAQUE IOHEXOL 300 MG/ML SOLN, 147mL OMNIPAQUE IOHEXOL 300 MG/ML SOLN  COMPARISON:  KUB 06/30/2013  FINDINGS: Lung bases are within normal.  Abdominal images demonstrate an 8 mm peripheral hypodensity over the right  lobe of the liver with subtle nodular peripheral enhancement suggesting a small hemangioma. The spleen, pancreas, gallbladder and adrenal glands are within normal. Kidneys are normal size without hydronephrosis or nephrolithiasis. Ureters are unremarkable. Abdominal aorta is normal. There is mild amount of fluid in the pericolic gutters bilaterally. Appendix is normal in caliber with air filling distally.  There is moderate to severe fecal retention throughout the colon. There are no dilated small bowel loops. There is no free peritoneal air. There is mild wall thickening with adjacent inflammation/ fluid involving the rectosigmoid colon likely  due to an acute colitis which may be infectious or inflammatory origin. Remaining pelvic structures are within normal. Remainder of the exam is unremarkable.  IMPRESSION: Moderate to severe fecal retention throughout the colon. Mild wall thickening with minimal adjacent inflammatory change involving the rectosigmoid colon. Mild free fluid in the pelvis and pericolic gutters. Findings are likely due to an acute colitis which may be of infectious or inflammatory etiology.  8 mm hypodensity over the right lobe of the liver liver likely a small hemangioma.   Electronically Signed   By: Marin Olp M.D.   On: 07/06/2013 14:44   Dg Abd 2 Views  06/30/2013   CLINICAL DATA:  Umbilical pain.  EXAM: ABDOMEN - 2 VIEW  COMPARISON:  None.  FINDINGS: Large stool burden throughout the colon. There is normal bowel gas pattern. No free air. No organomegaly or suspicious calcification. No acute bony abnormality.  IMPRESSION: Large stool burden suggesting constipation. Recommend clinical correlation.   Electronically Signed   By: Rolm Baptise M.D.   On: 06/30/2013 17:07   Dg Abd Acute W/chest  07/09/2013   CLINICAL DATA:  Lower abdominal pain.  EXAM: ACUTE ABDOMEN SERIES (ABDOMEN 2 VIEW & CHEST 1 VIEW)  COMPARISON:  CT ABD/PELVIS W CM dated 07/06/2013  FINDINGS: There is free extraluminal gas under the hemidiaphragms consistent with a perforated viscus. The bowel gas pattern is relatively nonspecific. There is a moderate amount of contrast present within the colon. There are loops of mildly distended gas-filled small bowel in the midline. There are no abnormal soft tissue calcifications within the abdomen. The chest reveals the lungs to be mildly hyperinflated and clear. The cardiac silhouette is normal in size. There is no pleural effusion.  IMPRESSION: Free extraluminal gas is consistent with a perforated viscus. This may be related to the suspected acute colitis. These results were called by telephone at the time of  interpretation on 07/09/2013 at 3:55 PM to Dr. Orlie Dakin , who verbally acknowledged these results.   Electronically Signed   By: David  Martinique   On: 07/09/2013 15:56   CRITICAL CARE Performed by: Orlie Dakin Total critical care time: 30 minute Critical care time was exclusive of separately billable procedures and treating other patients. Critical care was necessary to treat or prevent imminent or life-threatening deterioration. Critical care was time spent personally by me on the following activities: development of treatment plan with patient and/or surrogate as well as nursing, discussions with consultants, evaluation of patient's response to treatment, examination of patient, obtaining history from patient or surrogate, ordering and performing treatments and interventions, ordering and review of laboratory studies, ordering and review of radiographic studies, pulse oximetry and re-evaluation of patient's condition.     Orlie Dakin, MD 07/09/13 [id number]

## 2013-07-09 NOTE — ED Notes (Signed)
Notified dr Winfred Leeds of lactic acid,,kj

## 2013-07-09 NOTE — H&P (Signed)
Kristen Reyes is an 63 y.o. female.   PCP;   Marian Sorrow  Chief Complaint:  Abdominal pain, bloating and fever HPI:  This is a relatively healthy woman who started having pain and bloating about 12 days ago.  She saw her PCP ON 5/4 and was treated for constipation.  She had several doses of laxatives with some good results, but she continued to have pain and bloating.  Fever on 5/10 up to 102.2.  She was seen in the ED here and CT scan showed:  Moderate to severe fecal retention throughout the colon. Mild wall thickening with minimal adjacent inflammatory change involving the rectosigmoid colon. Mild free fluid in the pelvis and pericolic gutters. Findings are likely due to an acute colitis which may be of infectious or inflammatory etiology.   She was treated with Flagyl and Cipro, along with some hydrocodone for pain.  She said she would feel better with the pain medicine, but never really improved.  She noted increased bloating, she has had some nausea and vomiting today.  She missed a dose of pain medicine and developed fever again.  She returned to the ED as instructed.  Plain film shows: Free extraluminal gas is consistent with a perforated viscus. This may be related to the suspected acute colitis.  WBC is 13.2 with left shift, lactic acid is 2.83. She is tender all over and we are ask to see.    Past Medical History  Diagnosis Date  . Hypertension    Hx of MVP    Palpitations    Retinal issues last year   . Depression     Past Surgical History  Procedure Laterality Date  . Oophorectomy, laparoscopic  2003  . Cardiac catheterization  2002    FAMILY HISTORY;  Mother deceased at 42 with colon cancer and breast cancer, father living age 58 with AODM, 3 sisters in good health.  2 children in good health. Social History:  reports that she has quit smoking. She does not have any smokeless tobacco history on file. She reports that she drinks alcohol. She reports that she does not  use illicit drugs.  Allergies:  Allergies  Allergen Reactions  . Erythromycin Base Other (See Comments)    stomach pains   . Codeine Itching and Rash  . Penicillins Palpitations    Prior to Admission medications   Medication Sig Start Date End Date Taking? Authorizing Provider  acetaminophen (TYLENOL) 500 MG tablet Take 1,000 mg by mouth every 6 (six) hours as needed for mild pain.   Yes Historical Provider, MD  ALPRAZolam Duanne Moron) 0.5 MG tablet Take 0.125-0.25 mg by mouth at bedtime as needed for anxiety.   Yes Historical Provider, MD  aspirin EC 81 MG tablet Take 81 mg by mouth daily.   Yes Historical Provider, MD  ciprofloxacin (CIPRO) 500 MG tablet Take 1 tablet (500 mg total) by mouth 2 (two) times daily. 07/06/13  Yes Clayton Bibles, PA-C  citalopram (CELEXA) 10 MG tablet Take 5 mg by mouth every other day.   Yes Historical Provider, MD  HYDROcodone-acetaminophen (NORCO/VICODIN) 5-325 MG per tablet Take 1 tablet by mouth every 4 (four) hours as needed for moderate pain or severe pain. 07/06/13  Yes Clayton Bibles, PA-C  ibuprofen (ADVIL,MOTRIN) 200 MG tablet Take 400 mg by mouth every 6 (six) hours as needed for fever or moderate pain.   Yes Historical Provider, MD  metoprolol succinate (TOPROL-XL) 25 MG 24 hr tablet Take 37.5 mg by mouth  daily.   Yes Historical Provider, MD  metroNIDAZOLE (FLAGYL) 500 MG tablet Take 1 tablet (500 mg total) by mouth 3 (three) times daily. One po bid x 7 days 07/06/13  Yes Clayton Bibles, PA-C  Multiple Vitamin (MULTIVITAMIN WITH MINERALS) TABS tablet Take 1 tablet by mouth daily.   Yes Historical Provider, MD  zolpidem (AMBIEN) 10 MG tablet Take 3.5 mg by mouth at bedtime as needed for sleep.   Yes Historical Provider, MD     Results for orders placed during the hospital encounter of 07/09/13 (from the past 48 hour(s))  URINALYSIS, ROUTINE W REFLEX MICROSCOPIC     Status: Abnormal   Collection Time    07/09/13  2:02 PM      Result Value Ref Range   Color, Urine  BROWN (*) YELLOW   Comment: BIOCHEMICALS MAY BE AFFECTED BY COLOR   APPearance CLOUDY (*) CLEAR   Specific Gravity, Urine 1.031 (*) 1.005 - 1.030   pH 5.0  5.0 - 8.0   Glucose, UA 100 (*) NEGATIVE mg/dL   Hgb urine dipstick NEGATIVE  NEGATIVE   Bilirubin Urine MODERATE (*) NEGATIVE   Ketones, ur 15 (*) NEGATIVE mg/dL   Protein, ur 100 (*) NEGATIVE mg/dL   Urobilinogen, UA 2.0 (*) 0.0 - 1.0 mg/dL   Nitrite POSITIVE (*) NEGATIVE   Leukocytes, UA SMALL (*) NEGATIVE  URINE MICROSCOPIC-ADD ON     Status: Abnormal   Collection Time    07/09/13  2:02 PM      Result Value Ref Range   Squamous Epithelial / LPF FEW (*) RARE   WBC, UA 3-6  <3 WBC/hpf   Bacteria, UA MANY (*) RARE   Casts HYALINE CASTS (*) NEGATIVE   Comment: GRANULAR CAST   Urine-Other MUCOUS PRESENT     Comment: LESS THAN 10 mL OF URINE SUBMITTED  POC URINE PREG, ED     Status: None   Collection Time    07/09/13  2:11 PM      Result Value Ref Range   Preg Test, Ur NEGATIVE  NEGATIVE   Comment:            THE SENSITIVITY OF THIS     METHODOLOGY IS >24 mIU/mL  CBC WITH DIFFERENTIAL     Status: Abnormal   Collection Time    07/09/13  2:15 PM      Result Value Ref Range   WBC 13.2 (*) 4.0 - 10.5 K/uL   RBC 5.08  3.87 - 5.11 MIL/uL   Hemoglobin 15.2 (*) 12.0 - 15.0 g/dL   HCT 43.4  36.0 - 46.0 %   MCV 85.4  78.0 - 100.0 fL   MCH 29.9  26.0 - 34.0 pg   MCHC 35.0  30.0 - 36.0 g/dL   RDW 12.2  11.5 - 15.5 %   Platelets 303  150 - 400 K/uL   Neutrophils Relative % 90 (*) 43 - 77 %   Neutro Abs 11.8 (*) 1.7 - 7.7 K/uL   Lymphocytes Relative 4 (*) 12 - 46 %   Lymphs Abs 0.5 (*) 0.7 - 4.0 K/uL   Monocytes Relative 6  3 - 12 %   Monocytes Absolute 0.8  0.1 - 1.0 K/uL   Eosinophils Relative 0  0 - 5 %   Eosinophils Absolute 0.0  0.0 - 0.7 K/uL   Basophils Relative 0  0 - 1 %   Basophils Absolute 0.0  0.0 - 0.1 K/uL  COMPREHENSIVE METABOLIC PANEL  Status: Abnormal   Collection Time    07/09/13  2:15 PM      Result  Value Ref Range   Sodium 133 (*) 137 - 147 mEq/L   Potassium 6.0 (*) 3.7 - 5.3 mEq/L   Comment: MARKED HEMOLYSIS     HEMOLYSIS AT THIS LEVEL MAY AFFECT RESULT   Chloride 93 (*) 96 - 112 mEq/L   CO2 24  19 - 32 mEq/L   Glucose, Bld 162 (*) 70 - 99 mg/dL   BUN 26 (*) 6 - 23 mg/dL   Creatinine, Ser 0.94  0.50 - 1.10 mg/dL   Calcium 9.2  8.4 - 10.5 mg/dL   Total Protein 7.5  6.0 - 8.3 g/dL   Albumin 2.9 (*) 3.5 - 5.2 g/dL   AST 56 (*) 0 - 37 U/L   Comment: MARKED HEMOLYSIS     HEMOLYSIS AT THIS LEVEL MAY AFFECT RESULT   ALT 33  0 - 35 U/L   Comment: MARKED HEMOLYSIS     HEMOLYSIS AT THIS LEVEL MAY AFFECT RESULT   Alkaline Phosphatase 108  39 - 117 U/L   Comment: MARKED HEMOLYSIS     HEMOLYSIS AT THIS LEVEL MAY AFFECT RESULT   Total Bilirubin 1.7 (*) 0.3 - 1.2 mg/dL   GFR calc non Af Amer 63 (*) >90 mL/min   GFR calc Af Amer 73 (*) >90 mL/min   Comment: (NOTE)     The eGFR has been calculated using the CKD EPI equation.     This calculation has not been validated in all clinical situations.     eGFR's persistently <90 mL/min signify possible Chronic Kidney     Disease.  LIPASE, BLOOD     Status: None   Collection Time    07/09/13  2:15 PM      Result Value Ref Range   Lipase 11  11 - 59 U/L  POTASSIUM     Status: Abnormal   Collection Time    07/09/13  3:18 PM      Result Value Ref Range   Potassium 3.6 (*) 3.7 - 5.3 mEq/L   Comment: REPEATED TO VERIFY     DELTA CHECK NOTED  I-STAT CG4 LACTIC ACID, ED     Status: Abnormal   Collection Time    07/09/13  3:27 PM      Result Value Ref Range   Lactic Acid, Venous 2.83 (*) 0.5 - 2.2 mmol/L   Dg Abd Acute W/chest  07/09/2013   CLINICAL DATA:  Lower abdominal pain.  EXAM: ACUTE ABDOMEN SERIES (ABDOMEN 2 VIEW & CHEST 1 VIEW)  COMPARISON:  CT ABD/PELVIS W CM dated 07/06/2013  FINDINGS: There is free extraluminal gas under the hemidiaphragms consistent with a perforated viscus. The bowel gas pattern is relatively nonspecific. There  is a moderate amount of contrast present within the colon. There are loops of mildly distended gas-filled small bowel in the midline. There are no abnormal soft tissue calcifications within the abdomen. The chest reveals the lungs to be mildly hyperinflated and clear. The cardiac silhouette is normal in size. There is no pleural effusion.  IMPRESSION: Free extraluminal gas is consistent with a perforated viscus. This may be related to the suspected acute colitis. These results were called by telephone at the time of interpretation on 07/09/2013 at 3:55 PM to Dr. Orlie Dakin , who verbally acknowledged these results.   Electronically Signed   By: David  Martinique   On: 07/09/2013 15:56    Review  of Systems  Constitutional: Positive for fever (up to 102) and weight loss. Negative for chills.  HENT: Negative.   Eyes: Negative.        She has had some retinal issues in the past.  Respiratory: Negative for cough, hemoptysis, sputum production, shortness of breath and wheezing.   Cardiovascular: Positive for palpitations. Negative for orthopnea, claudication, leg swelling and PND.       She had a cardiac cath in the Past with nothing found.  Gastrointestinal: Positive for heartburn (occasional), nausea, vomiting and abdominal pain. Negative for blood in stool and melena. Constipation: she was being treated for constipation by her PCP.       Last colonoscopy 10 years ago  Genitourinary: Negative.   Musculoskeletal: Negative.   Skin: Negative.   Neurological: Negative.   Endo/Heme/Allergies: Negative.   Psychiatric/Behavioral: Positive for depression. The patient is nervous/anxious.     Blood pressure 118/72, pulse 96, temperature 98.3 F (36.8 C), temperature source Oral, resp. rate 16, SpO2 100.00%. Physical Exam  Constitutional: She is oriented to person, place, and time.  Thin WF, tachycardic, anxious.  Feels better with pain meds.  HENT:  Head: Normocephalic and atraumatic.  Nose: Nose  normal.  Eyes: Conjunctivae and EOM are normal. Pupils are equal, round, and reactive to light. Right eye exhibits no discharge. Left eye exhibits no discharge. No scleral icterus.  Neck: Normal range of motion. Neck supple. No JVD present. No tracheal deviation present. No thyromegaly present.  Cardiovascular: Regular rhythm, normal heart sounds and intact distal pulses.  Exam reveals no gallop and no friction rub.   No murmur heard. Respiratory: Effort normal and breath sounds normal. No respiratory distress. She has no wheezes. She has no rales. She exhibits no tenderness.  GI: She exhibits distension. She exhibits no mass. There is tenderness (she is very tender all over). There is rebound and guarding.  Musculoskeletal: She exhibits no edema and no tenderness.  Lymphadenopathy:    She has no cervical adenopathy.  Neurological: She is alert and oriented to person, place, and time. No cranial nerve deficit.  Skin: Skin is warm and dry. No rash noted. No erythema. No pallor.  Psychiatric: She has a normal mood and affect. Her behavior is normal. Judgment and thought content normal.     Assessment/Plan 1.  Perforated viscus with recent diagnosis of colitis and constipation. 2.  Palpitations, and history of MVP 3.  Hypertension 4.  Anxiety and depression 5.  Recent issue with retina, and vision in the last year.  Plan:  She has been seen by Dr. Excell Seltzer, and we plan to take her to the OR for exploratory laparotomy and probable partial colectomy/colostomy.  We are hydrating and starting Invanz.  Earnstine Regal 07/09/2013, 5:00 PM

## 2013-07-09 NOTE — H&P (Signed)
Patient interviewed and examined, imaging and history personally reviewed.  She has an obvious perforated viscus and diffuse abdominal tenderness and will require laparotomy. She had an area of thickened and inflamed sigmoid colon on CT scan 3 days ago and this is likely the area of perforation, possible stercoral ulcer or colitis of other etiology. We discussed the need for surgery and that she will have a temporary colostomy and open wound. This anesthesia bleeding, infection were discussed understood and she and her husband are in agreement.  Edward Jolly MD, FACS  07/09/2013 5:36 PM

## 2013-07-09 NOTE — Anesthesia Preprocedure Evaluation (Addendum)
Anesthesia Evaluation  Patient identified by MRN, date of birth, ID band Patient awake    Reviewed: Allergy & Precautions, H&P , NPO status , Patient's Chart, lab work & pertinent test results  Airway Mallampati: II TM Distance: >3 FB Neck ROM: Full    Dental no notable dental hx.    Pulmonary neg pulmonary ROS, former smoker,  breath sounds clear to auscultation  Pulmonary exam normal       Cardiovascular hypertension, Pt. on medications and Pt. on home beta blockers Rhythm:Regular Rate:Normal     Neuro/Psych negative neurological ROS  negative psych ROS   GI/Hepatic negative GI ROS, Neg liver ROS,   Endo/Other  negative endocrine ROS  Renal/GU negative Renal ROS  negative genitourinary   Musculoskeletal negative musculoskeletal ROS (+)   Abdominal (+)  Abdomen: rigid.    Peds negative pediatric ROS (+)  Hematology negative hematology ROS (+)   Anesthesia Other Findings   Reproductive/Obstetrics negative OB ROS                          Anesthesia Physical Anesthesia Plan  ASA: II and emergent  Anesthesia Plan: General   Post-op Pain Management:    Induction: Intravenous, Rapid sequence and Cricoid pressure planned  Airway Management Planned: Oral ETT  Additional Equipment:   Intra-op Plan:   Post-operative Plan: Extubation in OR  Informed Consent: I have reviewed the patients History and Physical, chart, labs and discussed the procedure including the risks, benefits and alternatives for the proposed anesthesia with the patient or authorized representative who has indicated his/her understanding and acceptance.   Dental advisory given  Plan Discussed with: CRNA and Surgeon  Anesthesia Plan Comments:         Anesthesia Quick Evaluation

## 2013-07-09 NOTE — ED Provider Notes (Signed)
Medical screening examination/treatment/procedure(s) were performed by non-physician practitioner and as supervising physician I was immediately available for consultation/collaboration.   Shi Grose T Carlei Huang, MD 07/09/13 2324 

## 2013-07-09 NOTE — ED Notes (Signed)
Pt back from x-ray.

## 2013-07-10 ENCOUNTER — Encounter (HOSPITAL_COMMUNITY): Payer: Self-pay | Admitting: Surgery

## 2013-07-10 LAB — COMPREHENSIVE METABOLIC PANEL
ALK PHOS: 67 U/L (ref 39–117)
ALT: 18 U/L (ref 0–35)
AST: 29 U/L (ref 0–37)
Albumin: 1.7 g/dL — ABNORMAL LOW (ref 3.5–5.2)
BUN: 19 mg/dL (ref 6–23)
CO2: 27 mEq/L (ref 19–32)
Calcium: 7.4 mg/dL — ABNORMAL LOW (ref 8.4–10.5)
Chloride: 105 mEq/L (ref 96–112)
Creatinine, Ser: 0.65 mg/dL (ref 0.50–1.10)
GFR calc Af Amer: >90 mL/min (ref >90)
GFR calc non Af Amer: >90 mL/min (ref >90)
Glucose, Bld: 142 mg/dL — ABNORMAL HIGH (ref 70–99)
POTASSIUM: 4.9 meq/L (ref 3.7–5.3)
Sodium: 139 mEq/L (ref 137–147)
Total Bilirubin: 1 mg/dL (ref 0.3–1.2)
Total Protein: 4.5 g/dL — ABNORMAL LOW (ref 6.0–8.3)

## 2013-07-10 LAB — CBC
HEMATOCRIT: 37.5 % (ref 36.0–46.0)
Hemoglobin: 12.6 g/dL (ref 12.0–15.0)
MCH: 29 pg (ref 26.0–34.0)
MCHC: 33.6 g/dL (ref 30.0–36.0)
MCV: 86.4 fL (ref 78.0–100.0)
Platelets: 299 10*3/uL (ref 150–400)
RBC: 4.34 MIL/uL (ref 3.87–5.11)
RDW: 12.4 % (ref 11.5–15.5)
WBC: 11.2 10*3/uL — ABNORMAL HIGH (ref 4.0–10.5)

## 2013-07-10 NOTE — Progress Notes (Signed)
Patient ID: Kristen Reyes, female   DOB: 1950-11-17, 63 y.o.   MRN: [id number] 1 Day Post-Op  Subjective: States she feels better than before surgery. Pain adequately controlled.  Objective: Vital signs in last 24 hours: Temp:  [97.6 F (36.4 C)-98.6 F (37 C)] 98.6 F (37 C) (05/14 0400) Pulse Rate:  [90-135] 107 (05/14 0600) Resp:  [10-24] 24 (05/14 0600) BP: (86-136)/(44-86) 118/86 mmHg (05/14 0600) SpO2:  [94 %-100 %] 98 % (05/14 0600) Weight:  [110 lb 0.2 oz (49.9 kg)] 110 lb 0.2 oz (49.9 kg) (05/13 2049)    Intake/Output from previous day: 05/13 0701 - 05/14 0700 In: 5150 [I.V.:5100; IV Piggyback:50] Out: 545 [Urine:245; Blood:300] Intake/Output this shift:    General appearance: alert, cooperative and no distress Resp: no wheezing or increased work of breathing. GI: nondistended. Mild appropriate incisional tenderness. Colostomy appears healthy. Incision/Wound: dressing clean and dry  Lab Results:   Recent Labs  07/09/13 1415 07/10/13 0312  WBC 13.2* 11.2*  HGB 15.2* 12.6  HCT 43.4 37.5  PLT 303 299   BMET  Recent Labs  07/09/13 1415 07/09/13 1518 07/10/13 0312  NA 133*  --  139  K 6.0* 3.6* 4.9  CL 93*  --  105  CO2 24  --  27  GLUCOSE 162*  --  142*  BUN 26*  --  19  CREATININE 0.94  --  0.65  CALCIUM 9.2  --  7.4*     Studies/Results: Dg Abd Acute W/chest  07/09/2013   CLINICAL DATA:  Lower abdominal pain.  EXAM: ACUTE ABDOMEN SERIES (ABDOMEN 2 VIEW & CHEST 1 VIEW)  COMPARISON:  CT ABD/PELVIS W CM dated 07/06/2013  FINDINGS: There is free extraluminal gas under the hemidiaphragms consistent with a perforated viscus. The bowel gas pattern is relatively nonspecific. There is a moderate amount of contrast present within the colon. There are loops of mildly distended gas-filled small bowel in the midline. There are no abnormal soft tissue calcifications within the abdomen. The chest reveals the lungs to be mildly hyperinflated and clear. The  cardiac silhouette is normal in size. There is no pleural effusion.  IMPRESSION: Free extraluminal gas is consistent with a perforated viscus. This may be related to the suspected acute colitis. These results were called by telephone at the time of interpretation on 07/09/2013 at 3:55 PM to Dr. Orlie Dakin , who verbally acknowledged these results.   Electronically Signed   By: David  Martinique   On: 07/09/2013 15:56    Anti-infectives: Anti-infectives   Start     Dose/Rate Route Frequency Ordered Stop   07/10/13 1700  ertapenem (INVANZ) 1 g in sodium chloride 0.9 % 50 mL IVPB     1 g 100 mL/hr over 30 Minutes Intravenous Every 24 hours 07/09/13 1659     07/09/13 1645  [MAR Hold]  ertapenem (INVANZ) 1 g in sodium chloride 0.9 % 50 mL IVPB     (On MAR Hold since 07/09/13 1741)   1 g 100 mL/hr over 30 Minutes Intravenous  Once 07/09/13 1626 07/09/13 1751      Assessment/Plan: s/p Procedure(s): EXPLORATORY LAPAROTOMY, COLOSTOMY, SIGMOID COLECTOMY, HARTMANS PROCEDURE Doing well postoperatively. Continue NG, IV antibiotics. Out of bed today. Transfer to floor.    LOS: 1 day    Edward Jolly 07/10/2013

## 2013-07-10 NOTE — Consult Note (Addendum)
WOC ostomy consult note Stoma type/location: LLQ Colostomy (temporary) Stomal assessment/size: 1 and 3/4 inches round, pink, moist, slightly long, edematous, os at center Peristomal assessment: intact, clear Treatment options for stomal/peristomal skin: None indicated Output: serous fluid.  Patient with NGT Ostomy pouching: 2pc., 2 and 1/4 inch pouching system. Education provided: Elongated session with husband, sister and daughter present. Patient fearful of discomfort an premedicated prior to pouch removal, but remained engaged, inquisitive and participated in session.  Basic A&P, stoma characteristics, pouch characteristics, diet, activity taught-all with good understanding.  Educational booklets provided to all.  Patient and family observed the sizing of the stoma and are taught that edema will subside and stoma will get smaller-necessitating the resizing on a regular basis as well as the adjustment in skin barrier (aperture) configuration. Taught that the usual regimen will be to empty when 1/3 to 1/2 full of flatus or stool and to change the entire pouching system twice weekly on Mondays and Thursdays. Supplies provided and are at bedside.  Registered with [employer]. Thank you for inviting [place] to consult on this nice woman. [place] nursing team will follow, and will remain available to this patient, her family, and  the nursing, surgical and medical teams.  Thanks, Maudie Flakes, MSN, RN, [place], [place], [place] [phone])

## 2013-07-10 NOTE — Care Management Note (Addendum)
    Page 1 of 2   07/23/2013     11:01:39 AM CARE MANAGEMENT NOTE 07/23/2013  Patient:  Kristen Reyes, Kristen Reyes   Account Number:  [id number]  Date Initiated:  07/10/2013  Documentation initiated by:  Sunday Spillers  Subjective/Objective Assessment:   63 yo female admitted with perforated viscus. PTA lived at home with spouse.     Action/Plan:   Home when stable   Anticipated DC Date:  07/26/2013   Anticipated DC Plan:  [place]  CM consult      [facility] Choice  HOME HEALTH   Choice offered to / List presented to:  C-1 Patient        [place] arranged  HH-1 RN      [place].   Status of service:  Completed, signed off Medicare Important Message given?   (If response is "NO", the following Medicare IM given date fields will be blank) Date Medicare IM given:   Date Additional Medicare IM given:    Discharge Disposition:  [place]  Per UR Regulation:  Reviewed for med. necessity/level of care/duration of stay  If discussed at [place] of Stay Meetings, dates discussed:    Comments:  07-23-13 [place] 1100 Spoke with patient at bedside. Has chosen [facility] for [facility] services. Contacted [facility] and notified of [place] orders.  07-11-13 Sunday Spillers RN CM 1400 Spoke with patient and family at bedside. Discussed need for [facility] RN for wound/ostomy care. Provided list of [place] agencies for choice, highlighted network providers. Patient will discuss with family and let me know.  ---07/11/2013 1212 by Camillo Flaming--- [place] PER [facility]/ [place].

## 2013-07-11 ENCOUNTER — Encounter (HOSPITAL_COMMUNITY): Payer: Self-pay | Admitting: General Surgery

## 2013-07-11 NOTE — Progress Notes (Signed)
Patient interviewed and examined. Stable and alert.Agree with the assessment and treatment plan outlined by Ms. Osborne. NG and Foley to be removed. Try to ambulate in the hall today.  Kristen Reyes. Kristen Reyes, M.D., [facility] General and Minimally invasive Surgery Breast and Colorectal Surgery Office:   [phone] Pager:   [phone]

## 2013-07-11 NOTE — Progress Notes (Signed)
Patient ID: Kristen Reyes, female   DOB: Oct 26, 1950, 63 y.o.   MRN: [id number] 2 Days Post-Op  Subjective: Pt doing well.  Feels rumbling in her belly.  No output from NGT  Objective: Vital signs in last 24 hours: Temp:  [98 F (36.7 C)-98.7 F (37.1 C)] 98 F (36.7 C) (05/15 0525) Pulse Rate:  [66-107] 66 (05/15 0525) Resp:  [17-18] 18 (05/15 0525) BP: (118-133)/(70-84) 133/84 mmHg (05/15 0525) SpO2:  [95 %-98 %] 95 % (05/15 0525)    Intake/Output from previous day: 05/14 0701 - 05/15 0700 In: 3035 [I.V.:2875; NG/GT:60; IV Piggyback:100] Out: 825 [Urine:825] Intake/Output this shift:    PE: Abd: soft, appropriately tender, wound is clean, but packing has not been changed since surgery.  Very active BS, ostomy is pink with no definite output currently.  NGT has not had any output since surgery.  Lab Results:   Recent Labs  07/09/13 1415 07/10/13 0312  WBC 13.2* 11.2*  HGB 15.2* 12.6  HCT 43.4 37.5  PLT 303 299   BMET  Recent Labs  07/09/13 1415 07/09/13 1518 07/10/13 0312  NA 133*  --  139  K 6.0* 3.6* 4.9  CL 93*  --  105  CO2 24  --  27  GLUCOSE 162*  --  142*  BUN 26*  --  19  CREATININE 0.94  --  0.65  CALCIUM 9.2  --  7.4*   PT/INR No results found for this basename: LABPROT, INR,  in the last 72 hours CMP     Component Value Date/Time   NA 139 07/10/2013 0312   K 4.9 07/10/2013 0312   CL 105 07/10/2013 0312   CO2 27 07/10/2013 0312   GLUCOSE 142* 07/10/2013 0312   BUN 19 07/10/2013 0312   CREATININE 0.65 07/10/2013 0312   CALCIUM 7.4* 07/10/2013 0312   PROT 4.5* 07/10/2013 0312   ALBUMIN 1.7* 07/10/2013 0312   AST 29 07/10/2013 0312   ALT 18 07/10/2013 0312   ALKPHOS 67 07/10/2013 0312   BILITOT 1.0 07/10/2013 0312   GFRNONAA >90 07/10/2013 0312   GFRAA >90 07/10/2013 0312   Lipase     Component Value Date/Time   LIPASE 11 07/09/2013 1415       Studies/Results: Dg Abd Acute W/chest  07/09/2013   CLINICAL DATA:  Lower abdominal pain.  EXAM:  ACUTE ABDOMEN SERIES (ABDOMEN 2 VIEW & CHEST 1 VIEW)  COMPARISON:  CT ABD/PELVIS W CM dated 07/06/2013  FINDINGS: There is free extraluminal gas under the hemidiaphragms consistent with a perforated viscus. The bowel gas pattern is relatively nonspecific. There is a moderate amount of contrast present within the colon. There are loops of mildly distended gas-filled small bowel in the midline. There are no abnormal soft tissue calcifications within the abdomen. The chest reveals the lungs to be mildly hyperinflated and clear. The cardiac silhouette is normal in size. There is no pleural effusion.  IMPRESSION: Free extraluminal gas is consistent with a perforated viscus. This may be related to the suspected acute colitis. These results were called by telephone at the time of interpretation on 07/09/2013 at 3:55 PM to Dr. Orlie Dakin , who verbally acknowledged these results.   Electronically Signed   By: David  Martinique   On: 07/09/2013 15:56    Anti-infectives: Anti-infectives   Start     Dose/Rate Route Frequency Ordered Stop   07/10/13 1700  ertapenem (INVANZ) 1 g in sodium chloride 0.9 % 50 mL IVPB  1 g 100 mL/hr over 30 Minutes Intravenous Every 24 hours 07/09/13 1659     07/09/13 1645  [MAR Hold]  ertapenem (INVANZ) 1 g in sodium chloride 0.9 % 50 mL IVPB     (On MAR Hold since 07/09/13 1741)   1 g 100 mL/hr over 30 Minutes Intravenous  Once 07/09/13 1626 07/09/13 1751       Assessment/Plan  1. POD 2, S/p Hartman's procedure for Stercoral perforation  2. HTN  Plan: 1. Will DC NGT today and give clear liquids and patient has great BS, ND.  She has also had no NGT output at all. 2. Mobilize in halls TID 3. Start dressing changes BID for wound care 4. [place] RN consult for ostomy care 5. DC foley today 6. pulm toilet 7. D3/7 of Invanz  LOS: 2 days    Henreitta Cea 07/11/2013, 8:59 AM Pager: [phone]

## 2013-07-11 NOTE — Consult Note (Signed)
WOC ostomy consult note Stoma type/location: LLQ Colostomy Stomal assessment/size: not assessed today, but it pink and moist through the pouch.  Treatment options for stomal/peristomal skin: none indicated, intact Output Scant amount of liquid in pouch, no output at this time.  Ostomy pouching: 2pc. 1 1/4 system.  Patient has pouches at the bedside.   Education provided: Patient was in bed, has ambulated in hallway and is groggy at this time. Discussed that bowel was not producing enough stool to make it into the pouch just yet, but that it was coming.  Verbalized understanding and had no questions at this time.  [place] team will continue to follow.  Domenic Moras RN BSN [place] Pager [phone]

## 2013-07-12 NOTE — Progress Notes (Signed)
Patient ID: Kristen Reyes, female   DOB: [birth year], 63 y.o.   MRN: [id number] 3 Days Post-Op  Subjective: Feels some nausea and bloating. Minimal pain. State she has had gas and bowel movements per colostomy bag empty currently.  Objective: Vital signs in last 24 hours: Temp:  [97.8 F (36.6 C)-98.7 F (37.1 C)] 98.7 F (37.1 C) (05/16 0558) Pulse Rate:  [74-90] 86 (05/16 0558) Resp:  [16-18] 18 (05/16 0558) BP: (126-131)/(69-82) 131/73 mmHg (05/16 0558) SpO2:  [94 %-97 %] 94 % (05/16 0558)    Intake/Output from previous day: 05/15 0701 - 05/16 0700 In: 2098.3 [I.V.:2048.3; IV Piggyback:50] Out: 740 [Urine:740] Intake/Output this shift:    General appearance: alert, cooperative and no distress GI: mild distention. Colostomy appears healthy. Nontender. Incision/Wound: wound packed open. Dressing clean and dry.  Lab Results:   Recent Labs  07/09/13 1415 07/10/13 0312  WBC 13.2* 11.2*  HGB 15.2* 12.6  HCT 43.4 37.5  PLT 303 299   BMET  Recent Labs  07/09/13 1415 07/09/13 1518 07/10/13 0312  NA 133*  --  139  K 6.0* 3.6* 4.9  CL 93*  --  105  CO2 24  --  27  GLUCOSE 162*  --  142*  BUN 26*  --  19  CREATININE 0.94  --  0.65  CALCIUM 9.2  --  7.4*     Studies/Results: No results found.  Anti-infectives: Anti-infectives   Start     Dose/Rate Route Frequency Ordered Stop   07/10/13 1700  ertapenem (INVANZ) 1 g in sodium chloride 0.9 % 50 mL IVPB     1 g 100 mL/hr over 30 Minutes Intravenous Every 24 hours 07/09/13 1659     07/09/13 1645  [MAR Hold]  ertapenem (INVANZ) 1 g in sodium chloride 0.9 % 50 mL IVPB     (On MAR Hold since 07/09/13 1741)   1 g 100 mL/hr over 30 Minutes Intravenous  Once 07/09/13 1626 07/09/13 1751      Assessment/Plan: s/p Procedure(s): EXPLORATORY LAPAROTOMY, COLOSTOMY, SIGMOID COLECTOMY, HARTMANS PROCEDURE Perforated sigmoid colon, pathology showed diverticulitis. Probably some ileus. Limited to sips of liquids  today. Continue IV antibiotics Check CBC and chemistries in a.m.   LOS: 3 days    Edward Jolly 07/12/2013

## 2013-07-12 NOTE — Progress Notes (Signed)
Patient vomited 150 cc of yellowish clear liquid. Nausea medication given will continue to monitor

## 2013-07-13 LAB — CBC
HEMATOCRIT: 30.1 % — AB (ref 36.0–46.0)
HEMOGLOBIN: 10.2 g/dL — AB (ref 12.0–15.0)
MCH: 29.4 pg (ref 26.0–34.0)
MCHC: 33.9 g/dL (ref 30.0–36.0)
MCV: 86.7 fL (ref 78.0–100.0)
Platelets: 327 10*3/uL (ref 150–400)
RBC: 3.47 MIL/uL — AB (ref 3.87–5.11)
RDW: 13.2 % (ref 11.5–15.5)
WBC: 19.3 10*3/uL — ABNORMAL HIGH (ref 4.0–10.5)

## 2013-07-13 LAB — BASIC METABOLIC PANEL
BUN: 17 mg/dL (ref 6–23)
CALCIUM: 7.5 mg/dL — AB (ref 8.4–10.5)
CO2: 21 mEq/L (ref 19–32)
Chloride: 107 mEq/L (ref 96–112)
Creatinine, Ser: 0.46 mg/dL — ABNORMAL LOW (ref 0.50–1.10)
GFR calc Af Amer: >90 mL/min (ref >90)
GFR calc non Af Amer: >90 mL/min (ref >90)
GLUCOSE: 98 mg/dL (ref 70–99)
Potassium: 4.2 mEq/L (ref 3.7–5.3)
SODIUM: 141 meq/L (ref 137–147)

## 2013-07-13 LAB — URINE CULTURE
Colony Count: NO GROWTH
Culture: NO GROWTH

## 2013-07-13 NOTE — Progress Notes (Signed)
Patient ID: Kristen Reyes, female   DOB: 07/29/1950, 63 y.o.   MRN: [id number] 4 Days Post-Op  Subjective: Some nausea, vomiting once yesterday. Feels a little better this morning. Some incisional pain, stable, didn't drink much liquids at all yesterday.  Objective: Vital signs in last 24 hours: Temp:  [98 F (36.7 C)-98.9 F (37.2 C)] 98 F (36.7 C) (05/17 0535) Pulse Rate:  [80-88] 80 (05/17 0535) Resp:  [16-18] 16 (05/17 0535) BP: (126-138)/(74-85) 126/74 mmHg (05/17 0535) SpO2:  [94 %-95 %] 95 % (05/17 0535) Last BM Date: 07/12/13  Intake/Output from previous day: 05/16 0701 - 05/17 0700 In: 2074.6 [I.V.:1874.6; IV Piggyback:200] Out: 1500 [Urine:1500] Intake/Output this shift:    General appearance: alert, cooperative, fatigued and no distress GI: mild distention.  Very mild appropriate tenderness. The stoma appears healthy. Some gas and colostomy bag Incision/Wound: dressing just changed, clean and dry  Lab Results:   Recent Labs  07/13/13 0455  WBC 19.3*  HGB 10.2*  HCT 30.1*  PLT 327   BMET  Recent Labs  07/13/13 0455  NA 141  K 4.2  CL 107  CO2 21  GLUCOSE 98  BUN 17  CREATININE 0.46*  CALCIUM 7.5*     Studies/Results: No results found.  Anti-infectives: Anti-infectives   Start     Dose/Rate Route Frequency Ordered Stop   07/10/13 1700  ertapenem (INVANZ) 1 g in sodium chloride 0.9 % 50 mL IVPB     1 g 100 mL/hr over 30 Minutes Intravenous Every 24 hours 07/09/13 1659     07/09/13 1645  [MAR Hold]  ertapenem (INVANZ) 1 g in sodium chloride 0.9 % 50 mL IVPB     (On MAR Hold since 07/09/13 1741)   1 g 100 mL/hr over 30 Minutes Intravenous  Once 07/09/13 1626 07/09/13 1751      Assessment/Plan: s/p Procedure(s): EXPLORATORY LAPAROTOMY, COLOSTOMY, SIGMOID COLECTOMY, HARTMANS PROCEDURE Perforated sigmoid colon with fecal peritonitis Still some degree of ileus. Continue clear liquids only her Leukocytosis. She is at risk for abdominal  abscess. Repeat CBC tomorrow. Continue IV antibiotics.   LOS: 4 days    Edward Jolly 07/13/2013

## 2013-07-14 LAB — CBC
HCT: 31.7 % — ABNORMAL LOW (ref 36.0–46.0)
Hemoglobin: 10.4 g/dL — ABNORMAL LOW (ref 12.0–15.0)
MCH: 28.7 pg (ref 26.0–34.0)
MCHC: 32.8 g/dL (ref 30.0–36.0)
MCV: 87.6 fL (ref 78.0–100.0)
PLATELETS: 363 10*3/uL (ref 150–400)
RBC: 3.62 MIL/uL — AB (ref 3.87–5.11)
RDW: 13.2 % (ref 11.5–15.5)
WBC: 21.1 10*3/uL — ABNORMAL HIGH (ref 4.0–10.5)

## 2013-07-14 LAB — BASIC METABOLIC PANEL
BUN: 11 mg/dL (ref 6–23)
CHLORIDE: 105 meq/L (ref 96–112)
CO2: 21 mEq/L (ref 19–32)
Calcium: 7.5 mg/dL — ABNORMAL LOW (ref 8.4–10.5)
Creatinine, Ser: 0.41 mg/dL — ABNORMAL LOW (ref 0.50–1.10)
GFR calc non Af Amer: >90 mL/min (ref >90)
Glucose, Bld: 88 mg/dL (ref 70–99)
Potassium: 4.1 mEq/L (ref 3.7–5.3)
Sodium: 138 mEq/L (ref 137–147)

## 2013-07-14 MED ORDER — NALOXONE HCL 0.4 MG/ML IJ SOLN: 0.4000 mg | INTRAMUSCULAR | Status: DC | PRN

## 2013-07-14 MED ORDER — SODIUM CHLORIDE 0.9 % IJ SOLN: 9.0000 mL | INTRAMUSCULAR | Status: DC | PRN

## 2013-07-14 MED ORDER — ONDANSETRON HCL 4 MG/2ML IJ SOLN: 4.0000 mg | Freq: Four times a day (QID) | INTRAMUSCULAR | Status: DC | PRN

## 2013-07-14 MED ORDER — ACETAMINOPHEN 160 MG/5ML PO SOLN: 650.0000 mg | ORAL | Status: DC | PRN

## 2013-07-14 MED ORDER — OXYCODONE HCL 5 MG/5ML PO SOLN
5.0000 mg | ORAL | Status: DC | PRN
  Administered 2013-07-14 (×2): 5 mg via ORAL
  Filled 2013-07-14 (×2): qty 5

## 2013-07-14 MED ORDER — HYDROMORPHONE 0.3 MG/ML IV SOLN
INTRAVENOUS | Status: DC
  Administered 2013-07-14: 1.2 mL via INTRAVENOUS
  Administered 2013-07-14: 01:00:00 via INTRAVENOUS
  Administered 2013-07-14: 0.3 mg via INTRAVENOUS
  Administered 2013-07-14: 0.43 mg via INTRAVENOUS
  Administered 2013-07-14 – 2013-07-15 (×3): 0.3 mL via INTRAVENOUS
  Administered 2013-07-15: 0.3 mg via INTRAVENOUS
  Filled 2013-07-14: qty 25

## 2013-07-14 MED ORDER — DIPHENHYDRAMINE HCL 50 MG/ML IJ SOLN: 12.5000 mg | Freq: Four times a day (QID) | INTRAMUSCULAR | Status: DC | PRN

## 2013-07-14 MED ORDER — METOPROLOL SUCCINATE 12.5 MG HALF TABLET
12.5000 mg | ORAL_TABLET | Freq: Two times a day (BID) | ORAL | Status: DC
  Administered 2013-07-14 (×2): 12.5 mg via ORAL
  Filled 2013-07-14 (×4): qty 1

## 2013-07-14 MED ORDER — DIPHENHYDRAMINE HCL 12.5 MG/5ML PO ELIX: 12.5000 mg | ORAL_SOLUTION | Freq: Four times a day (QID) | ORAL | Status: DC | PRN

## 2013-07-14 MED ORDER — ENOXAPARIN SODIUM 40 MG/0.4ML ~~LOC~~ SOLN
40.0000 mg | SUBCUTANEOUS | Status: DC
  Administered 2013-07-14 – 2013-07-25 (×11): 40 mg via SUBCUTANEOUS
  Filled 2013-07-14 (×13): qty 0.4

## 2013-07-14 NOTE — Progress Notes (Signed)
5 Days Post-Op  Subjective: She has some bowel sounds and stool in the ostomy bag.  She is very sore and started on PCA yesterday for pain.  She has not been up to much.  She is taking some clears, but not very much.  She is concerned her legs are swelling some.  Objective: Vital signs in last 24 hours: Temp:  [98.4 F (36.9 C)-99 F (37.2 C)] 99 F (37.2 C) (05/18 0651) Pulse Rate:  [79-80] 80 (05/17 1500) Resp:  [16-18] 16 (05/18 0651) BP: (125-138)/(67-78) 138/78 mmHg (05/18 0651) SpO2:  [96 %-97 %] 96 % (05/18 0651) Last BM Date: 07/13/13 PO not recorded. Diet: clears 2 stools recorded. Afebrile, VSS HR in the 80's WBC is still very high 21.1K No films Intake/Output from previous day: 05/17 0701 - 05/18 0700 In: 2050 [I.V.:2000; IV Piggyback:50] Out: 5400 [Urine:1505] Intake/Output this shift:    General appearance: alert, cooperative and no distress Lungs, clear down some in the bases.  She isn't moving a great deal GI: soft, still very tender, + BS and some stool in the ostomy bag. LE:  Minimal lower leg swelling.  Lab Results:   Recent Labs  07/13/13 0455 07/14/13 0410  WBC 19.3* 21.1*  HGB 10.2* 10.4*  HCT 30.1* 31.7*  PLT 327 363    BMET  Recent Labs  07/13/13 0455 07/14/13 0410  NA 141 138  K 4.2 4.1  CL 107 105  CO2 21 21  GLUCOSE 98 88  BUN 17 11  CREATININE 0.46* 0.41*  CALCIUM 7.5* 7.5*   PT/INR No results found for this basename: LABPROT, INR,  in the last 72 hours   Recent Labs Lab 07/09/13 1415 07/10/13 0312  AST 56* 29  ALT 33 18  ALKPHOS 108 67  BILITOT 1.7* 1.0  PROT 7.5 4.5*  ALBUMIN 2.9* 1.7*     Lipase     Component Value Date/Time   LIPASE 11 07/09/2013 1415     Studies/Results: No results found.  Medications: . enoxaparin (LOVENOX) injection  40 mg Subcutaneous Q24H  . ertapenem  1 g Intravenous Q24H  . famotidine (PEPCID) IV  20 mg Intravenous Q12H  . HYDROmorphone PCA 0.3 mg/mL   Intravenous 6 times per  day  . metoprolol  5 mg Intravenous 4 times per day   Prior to Admission medications   Medication Sig Start Date End Date Taking? Authorizing Provider  acetaminophen (TYLENOL) 500 MG tablet Take 1,000 mg by mouth every 6 (six) hours as needed for mild pain.   Yes Historical Provider, MD  ALPRAZolam Duanne Moron) 0.5 MG tablet Take 0.125-0.25 mg by mouth at bedtime as needed for anxiety.   Yes Historical Provider, MD  aspirin EC 81 MG tablet Take 81 mg by mouth daily.   Yes Historical Provider, MD  ciprofloxacin (CIPRO) 500 MG tablet Take 1 tablet (500 mg total) by mouth 2 (two) times daily. 07/06/13  Yes Clayton Bibles, PA-C  citalopram (CELEXA) 10 MG tablet Take 5 mg by mouth every other day.   Yes Historical Provider, MD  HYDROcodone-acetaminophen (NORCO/VICODIN) 5-325 MG per tablet Take 1 tablet by mouth every 4 (four) hours as needed for moderate pain or severe pain. 07/06/13  Yes Clayton Bibles, PA-C  ibuprofen (ADVIL,MOTRIN) 200 MG tablet Take 400 mg by mouth every 6 (six) hours as needed for fever or moderate pain.   Yes Historical Provider, MD  metoprolol succinate (TOPROL-XL) 25 MG 24 hr tablet Take 37.5 mg by mouth daily.  Yes Historical Provider, MD  metroNIDAZOLE (FLAGYL) 500 MG tablet Take 1 tablet (500 mg total) by mouth 3 (three) times daily. One po bid x 7 days 07/06/13  Yes Clayton Bibles, PA-C  Multiple Vitamin (MULTIVITAMIN WITH MINERALS) TABS tablet Take 1 tablet by mouth daily.   Yes Historical Provider, MD  zolpidem (AMBIEN) 10 MG tablet Take 3.5 mg by mouth at bedtime as needed for sleep.   Yes Historical Provider, MD     Assessment/Plan 1.  Stercoral perforation, s/p EXPLORATORY LAPAROTOMY, COLOSTOMY, SIGMOID COLECTOMY, HARTMANS PROCEDURE, 07/09/2013,  Edward Jolly, MD. 2. HTN 3.  Hx of MVP and palpatations 4.  Depression  5.  POST OP leukocytosis, day 6 today of Invanz     PLan;  I will work on getting her mobilized more.  I am going to advance her diet, and restart some of her  home medicines.  Add oral pain meds.  I am mostly concerned with her elevated WBC.  She is sore all over abdomen and taking Dilaudid PCA.  With her contamination I worry about an abscess.  If WBC is still up tomorrow will consider repeat CT scan.  LOS: 5 days    Kristen Reyes 07/14/2013

## 2013-07-14 NOTE — Anesthesia Postprocedure Evaluation (Signed)
  Anesthesia Post-op Note  Patient: Kristen Reyes  Procedure(s) Performed: Procedure(s) (LRB): EXPLORATORY LAPAROTOMY, COLOSTOMY, SIGMOID COLECTOMY, HARTMANS PROCEDURE (N/A)  Patient Location: PACU  Anesthesia Type: General  Level of Consciousness: awake and alert   Airway and Oxygen Therapy: Patient Spontanous Breathing  Post-op Pain: mild  Post-op Assessment: Post-op Vital signs reviewed, Patient's Cardiovascular Status Stable, Respiratory Function Stable, Patent Airway and No signs of Nausea or vomiting  Last Vitals:  Filed Vitals:   07/14/13 0651  BP: 138/78  Pulse:   Temp: 37.2 C  Resp: 16    Post-op Vital Signs: stable   Complications: No apparent anesthesia complications

## 2013-07-14 NOTE — Progress Notes (Signed)
ATTENDING ADDENDUM:  I personally reviewed patient's record, examined the patient, and formulated the following assessment and plan:  Agree with PA note.  Vitals stable without signs of sepsis, but wbc elevated.  Concerning for development of intra-abd abscess.  Will try some clears today and cont some IV fluids.  Cont Invanz.  If WBC is still trending up tom, will get CT

## 2013-07-15 DIAGNOSIS — E46 Unspecified protein-calorie malnutrition: Secondary | ICD-10-CM

## 2013-07-15 LAB — BASIC METABOLIC PANEL
BUN: 8 mg/dL (ref 6–23)
CALCIUM: 7.6 mg/dL — AB (ref 8.4–10.5)
CO2: 23 mEq/L (ref 19–32)
Chloride: 102 mEq/L (ref 96–112)
Creatinine, Ser: 0.4 mg/dL — ABNORMAL LOW (ref 0.50–1.10)
GFR calc Af Amer: >90 mL/min (ref >90)
GFR calc non Af Amer: >90 mL/min (ref >90)
GLUCOSE: 92 mg/dL (ref 70–99)
Potassium: 4.1 mEq/L (ref 3.7–5.3)
SODIUM: 137 meq/L (ref 137–147)

## 2013-07-15 LAB — CBC
HEMATOCRIT: 30.5 % — AB (ref 36.0–46.0)
HEMOGLOBIN: 10.4 g/dL — AB (ref 12.0–15.0)
MCH: 29.4 pg (ref 26.0–34.0)
MCHC: 34.1 g/dL (ref 30.0–36.0)
MCV: 86.2 fL (ref 78.0–100.0)
Platelets: 383 10*3/uL (ref 150–400)
RBC: 3.54 MIL/uL — AB (ref 3.87–5.11)
RDW: 13.1 % (ref 11.5–15.5)
WBC: 16.4 10*3/uL — ABNORMAL HIGH (ref 4.0–10.5)

## 2013-07-15 LAB — PREALBUMIN: PREALBUMIN: 5.6 mg/dL — AB (ref 17.0–34.0)

## 2013-07-15 MED ORDER — LACTATED RINGERS IV BOLUS (SEPSIS)
1000.0000 mL | Freq: Once | INTRAVENOUS | Status: AC
  Administered 2013-07-15: 1000 mL via INTRAVENOUS

## 2013-07-15 MED ORDER — MAGIC MOUTHWASH
15.0000 mL | Freq: Four times a day (QID) | ORAL | Status: DC | PRN
  Filled 2013-07-15: qty 15

## 2013-07-15 MED ORDER — POTASSIUM CHLORIDE IN NACL 20-0.9 MEQ/L-% IV SOLN
INTRAVENOUS | Status: AC
  Administered 2013-07-16: 11:00:00 via INTRAVENOUS
  Filled 2013-07-15: qty 1000

## 2013-07-15 MED ORDER — CHLORHEXIDINE GLUCONATE 0.12 % MT SOLN
15.0000 mL | Freq: Two times a day (BID) | OROMUCOSAL | Status: DC
  Administered 2013-07-15 – 2013-07-22 (×8): 15 mL via OROMUCOSAL
  Filled 2013-07-15 (×18): qty 15

## 2013-07-15 MED ORDER — METOPROLOL TARTRATE 1 MG/ML IV SOLN
5.0000 mg | Freq: Four times a day (QID) | INTRAVENOUS | Status: DC | PRN
  Filled 2013-07-15: qty 5

## 2013-07-15 MED ORDER — FENTANYL CITRATE 0.05 MG/ML IJ SOLN
12.5000 ug | INTRAMUSCULAR | Status: DC | PRN
  Administered 2013-07-15 – 2013-07-17 (×11): 12.5 ug via INTRAVENOUS
  Filled 2013-07-15 (×11): qty 2

## 2013-07-15 MED ORDER — ACETAMINOPHEN 10 MG/ML IV SOLN
1000.0000 mg | Freq: Four times a day (QID) | INTRAVENOUS | Status: AC
  Administered 2013-07-15 – 2013-07-16 (×4): 1000 mg via INTRAVENOUS
  Filled 2013-07-15 (×4): qty 100

## 2013-07-15 MED ORDER — HYDROMORPHONE HCL PF 1 MG/ML IJ SOLN: 0.5000 mg | INTRAMUSCULAR | Status: DC | PRN

## 2013-07-15 MED ORDER — ACETAMINOPHEN 650 MG RE SUPP: 650.0000 mg | Freq: Four times a day (QID) | RECTAL | Status: DC | PRN

## 2013-07-15 MED ORDER — CLINIMIX E/DEXTROSE (5/15) 5 % IV SOLN
INTRAVENOUS | Status: AC
  Administered 2013-07-15: 18:00:00 via INTRAVENOUS
  Filled 2013-07-15: qty 1000

## 2013-07-15 MED ORDER — LIP MEDEX EX OINT
1.0000 | TOPICAL_OINTMENT | Freq: Two times a day (BID) | CUTANEOUS | Status: DC
Start: 2013-07-15 — End: 2013-07-25
  Administered 2013-07-15 – 2013-07-25 (×21): 1 via TOPICAL
  Filled 2013-07-15 (×2): qty 7

## 2013-07-15 MED ORDER — SODIUM CHLORIDE 0.9 % IJ SOLN
10.0000 mL | INTRAMUSCULAR | Status: DC | PRN
  Administered 2013-07-17 – 2013-07-24 (×5): 10 mL

## 2013-07-15 MED ORDER — FAT EMULSION 20 % IV EMUL
240.0000 mL | INTRAVENOUS | Status: AC
  Administered 2013-07-15: 240 mL via INTRAVENOUS
  Filled 2013-07-15: qty 250

## 2013-07-15 MED ORDER — ALUM & MAG HYDROXIDE-SIMETH 200-200-20 MG/5ML PO SUSP: 30.0000 mL | Freq: Four times a day (QID) | ORAL | Status: DC | PRN

## 2013-07-15 MED ORDER — METOPROLOL TARTRATE 1 MG/ML IV SOLN
2.5000 mg | Freq: Four times a day (QID) | INTRAVENOUS | Status: DC
  Administered 2013-07-15 – 2013-07-24 (×36): 2.5 mg via INTRAVENOUS
  Filled 2013-07-15 (×45): qty 5

## 2013-07-15 MED ORDER — BIOTENE DRY MOUTH MT LIQD
15.0000 mL | Freq: Two times a day (BID) | OROMUCOSAL | Status: DC
  Administered 2013-07-16 – 2013-07-21 (×7): 15 mL via OROMUCOSAL

## 2013-07-15 MED ORDER — PROMETHAZINE HCL 25 MG/ML IJ SOLN
6.2500 mg | Freq: Four times a day (QID) | INTRAMUSCULAR | Status: DC | PRN
  Administered 2013-07-15 – 2013-07-16 (×2): 12.5 mg via INTRAVENOUS
  Administered 2013-07-17: 25 mg via INTRAVENOUS
  Filled 2013-07-15 (×3): qty 1

## 2013-07-15 MED ORDER — LACTATED RINGERS IV BOLUS (SEPSIS): 1000.0000 mL | Freq: Three times a day (TID) | INTRAVENOUS | Status: AC | PRN

## 2013-07-15 MED ORDER — DIPHENHYDRAMINE HCL 50 MG/ML IJ SOLN: 12.5000 mg | Freq: Four times a day (QID) | INTRAMUSCULAR | Status: DC | PRN

## 2013-07-15 MED ORDER — METOCLOPRAMIDE HCL 5 MG/ML IJ SOLN
5.0000 mg | Freq: Four times a day (QID) | INTRAMUSCULAR | Status: DC | PRN
  Administered 2013-07-16 – 2013-07-17 (×2): 10 mg via INTRAVENOUS
  Filled 2013-07-15 (×2): qty 2

## 2013-07-15 MED ORDER — BISACODYL 10 MG RE SUPP: 10.0000 mg | Freq: Two times a day (BID) | RECTAL | Status: DC | PRN

## 2013-07-15 MED ORDER — ONDANSETRON HCL 4 MG/2ML IJ SOLN
4.0000 mg | Freq: Four times a day (QID) | INTRAMUSCULAR | Status: DC | PRN
  Administered 2013-07-16 (×2): 4 mg via INTRAVENOUS
  Filled 2013-07-15 (×2): qty 2

## 2013-07-15 MED ORDER — ONDANSETRON 8 MG/NS 50 ML IVPB
8.0000 mg | Freq: Four times a day (QID) | INTRAVENOUS | Status: DC | PRN
  Filled 2013-07-15: qty 8

## 2013-07-15 NOTE — Evaluation (Signed)
Physical Therapy Evaluation Patient Details Name: Kristen Reyes MRN: [id number] DOB: [birth year] Today's Date: 07/15/2013   History of Present Illness  63 yo female s/p exp lap, colostomy, Hartmann's procedure 5/13.   Clinical Impression  On eval, pt required Min assist for mobility-able to ambulate ~500 feet while holding onto IV pole.     Follow Up Recommendations No PT follow up;Supervision - Intermittent    Equipment Recommendations  None recommended by PT    Recommendations for Other Services       Precautions / Restrictions Precautions Precaution Comments: colostomy, abd surgery Restrictions Weight Bearing Restrictions: No      Mobility  Bed Mobility Overal bed mobility: Needs Assistance Bed Mobility: Supine to Sit;Sit to Supine     Supine to sit: Min assist;HOB elevated Sit to supine: Min assist;HOB elevated   General bed mobility comments: assist for trunk, LEs.   Transfers Overall transfer level: Needs assistance   Transfers: Sit to/from Stand Sit to Stand: Min assist         General transfer comment: Assist to rise, stabilize, control descent.   Ambulation/Gait Ambulation/Gait assistance: Min guard Ambulation Distance (Feet): 500 Feet Assistive device:  (IV pole) Gait Pattern/deviations: Decreased stride length;Step-through pattern     General Gait Details: close guard for safety. slow gait speed.   Stairs            Wheelchair Mobility    Modified Rankin (Stroke Patients Only)       Balance                                             Pertinent Vitals/Pain Abdomen 4/10    Home Living Family/patient expects to be discharged to:: Private residence Living Arrangements: Spouse/significant other   Type of Home: House Home Access: Stairs to enter Entrance Stairs-Rails: Right Entrance Stairs-Number of Steps: [place]: One level Home Equipment: None      Prior Function Level of Independence:  Independent               Hand Dominance        Extremity/Trunk Assessment   Upper Extremity Assessment: Overall WFL for tasks assessed           Lower Extremity Assessment: Overall WFL for tasks assessed      Cervical / Trunk Assessment: Normal  Communication   Communication: No difficulties  Cognition Arousal/Alertness: Awake/alert Behavior During Therapy: WFL for tasks assessed/performed Overall Cognitive Status: Within Functional Limits for tasks assessed                      General Comments      Exercises        Assessment/Plan    PT Assessment Patient needs continued PT services  PT Diagnosis Difficulty walking;Generalized weakness   PT Problem List Decreased strength;Decreased activity tolerance;Decreased balance;Decreased mobility;Pain  PT Treatment Interventions Gait training;Functional mobility training;Therapeutic activities;Therapeutic exercise;Patient/family education;Balance training   PT Goals (Current goals can be found in the Care Plan section) Acute Rehab PT Goals Patient Stated Goal: home soon. regain independence PT Goal Formulation: With patient Time For Goal Achievement: 07/29/13 Potential to Achieve Goals: Good    Frequency Min 3X/week   Barriers to discharge        Co-evaluation               End of  Session   Activity Tolerance: Patient tolerated treatment well Patient left: in bed;with call bell/phone within reach;with family/visitor present           Time: 0300-9233 PT Time Calculation (min): 17 min   Charges:   PT Evaluation $Initial PT Evaluation Tier I: 1 Procedure PT Treatments $Gait Training: 8-22 mins   PT G Codes:          Weston Anna, MPT Pager: [phone]

## 2013-07-15 NOTE — Progress Notes (Signed)
ATTENDING ADDENDUM:  I personally reviewed patient's record, examined the patient, and formulated the following assessment and plan:  Slow return of bowel function.  WBC better today.  Will start TPN, as I anticipate she will have a prolonged ileus.  Recheck labs in AM.

## 2013-07-15 NOTE — Progress Notes (Signed)
6 Days Post-Op  Subjective: She does not feel well, she vomited twice last PM with chicken broth, a couple hours after she ate.  She has some pastely like stool in the ostomy bag and a very small amount of gas.  She did walk about 6 times yesterday.  She has not eaten since 07/06/13.  Objective: Vital signs in last 24 hours: Temp:  [98.2 F (36.8 C)-99.3 F (37.4 C)] 98.2 F (36.8 C) (05/19 0512) Pulse Rate:  [89-97] 96 (05/19 0512) Resp:  [15-21] 15 (05/19 0743) BP: (131-143)/(74-83) 131/80 mmHg (05/19 0512) SpO2:  [95 %-98 %] 96 % (05/19 0512) Last BM Date: 07/13/13 Vomited x 3 No stool recorded NPO TM 99.3, BMP stable, WBC improving. Intake/Output from previous day: 05/18 0701 - 05/19 0700 In: 3325.8 [I.V.:2125.8; IV Piggyback:1200] Out: 2536 [Urine:1325; Emesis/NG output:400] Intake/Output this shift:    General appearance: alert, cooperative, no distress and she almost feels nauseated even with ice chips. Resp: clear to auscultation bilaterally GI: she is still somewhat distended, sore, no bowel sounds right now. Some paste like stool in the bag.  Open abdominal wound site looks fine. Lab Results:   Recent Labs  07/14/13 0410 07/15/13 0350  WBC 21.1* 16.4*  HGB 10.4* 10.4*  HCT 31.7* 30.5*  PLT 363 383    BMET  Recent Labs  07/14/13 0410 07/15/13 0350  NA 138 137  K 4.1 4.1  CL 105 102  CO2 21 23  GLUCOSE 88 92  BUN 11 8  CREATININE 0.41* 0.40*  CALCIUM 7.5* 7.6*   PT/INR No results found for this basename: LABPROT, INR,  in the last 72 hours   Recent Labs Lab 07/09/13 1415 07/10/13 0312  AST 56* 29  ALT 33 18  ALKPHOS 108 67  BILITOT 1.7* 1.0  PROT 7.5 4.5*  ALBUMIN 2.9* 1.7*     Lipase     Component Value Date/Time   LIPASE 11 07/09/2013 1415     Studies/Results: No results found.  Medications: . enoxaparin (LOVENOX) injection  40 mg Subcutaneous Q24H  . ertapenem  1 g Intravenous Q24H  . famotidine (PEPCID) IV  20 mg  Intravenous Q12H  . HYDROmorphone PCA 0.3 mg/mL   Intravenous 6 times per day  . lip balm  1 application Topical BID  . metoprolol succinate  12.5 mg Oral BID    Assessment/Plan 1. Stercoral perforation, s/p EXPLORATORY LAPAROTOMY, COLOSTOMY, SIGMOID COLECTOMY, HARTMANS PROCEDURE, 07/09/2013, Edward Jolly, MD.  2. HTN  3. Hx of MVP and palpatations  4. Depression  5. POST OP leukocytosis, day 6 today of Invanz    Plan:  I am going to place a PICC line and start some TNA on her.  She wants to get rid of the PCA because it beeps all night.  She says she gets pain in her abdomen with Morphine and dilaudid injection, followed by some dizziness in her head. Try some fentanyl,  I don't think she could keep contrast down right now if we tried to do a CT scan.  Recheck labs again tomorrow and discuss with Dr. Marcello Moores.        LOS: 6 days    Earnstine Regal 07/15/2013

## 2013-07-15 NOTE — Progress Notes (Signed)
Peripherally Inserted Central Catheter/Midline Placement  The IV Nurse has discussed with the patient and/or persons authorized to consent for the patient, the purpose of this procedure and the potential benefits and risks involved with this procedure.  The benefits include less needle sticks, lab draws from the catheter and patient may be discharged home with the catheter.  Risks include, but not limited to, infection, bleeding, blood clot (thrombus formation), and puncture of an artery; nerve damage and irregular heat beat.  Alternatives to this procedure were also discussed.  PICC/Midline Placement Documentation        Kristen Reyes 07/15/2013, 5:41 PM

## 2013-07-15 NOTE — Progress Notes (Signed)
INITIAL NUTRITION ASSESSMENT  DOCUMENTATION CODES Per approved criteria  -Not Applicable   INTERVENTION: - TPN per pharmacist, suspect pt with some degree of refeeding risk r/t NPO for 9 days, recommend close monitoring/repletion of potassium, magnesium, and phosphorus - Diet advancement per MD - RD to continue to monitor   NUTRITION DIAGNOSIS: Inadequate oral intake related to inability to eat as evidenced by NPO.   Goal: TPN to meet >90% of estimated nutritional needs  Monitor:  Weights, labs, TPN, diet advancement   Reason for Assessment: Consult for TPN  63 y.o. female  Admitting Dx: Perforated viscus   ASSESSMENT: Pt is a relatively healthy woman who started having pain and bloating about 12 days PTA. She saw her PCP on 5/4 and was treated for constipation. She had several doses of laxatives with some good results, but she continued to have pain and bloating. Fever on 5/10 up to 102.2 degrees Farenheit. Per MD, plain film showed: Free extraluminal gas is consistent with a perforated viscus. This may be related to the suspected acute colitis. Had exploratory laparotomy with colostomy, sigmoid colectomy, and Hartman's procedure. TPN ordered to start today for anticipated prolonged ileus.   -Met with pt who reports being NPO since 5/10 -Before then she had a good appetite and was eating 3 well balanced meals/day -States her weight has been stable -Reports 3 episodes of vomiting yesterday after starting clear liquid diet -Denies any nausea or vomiting today -Reports having some output from colostomy today  Nutrition Focused Physical Exam:  Subcutaneous Fat:  Orbital Region: WNL Upper Arm Region: WNL Thoracic and Lumbar Region: WNL  Muscle:  Temple Region: WNL Clavicle Bone Region: WNL Clavicle and Acromion Bone Region: WNL Scapular Bone Region: NA Dorsal Hand: WNL Patellar Region: NA Anterior Thigh Region: NA Posterior Calf Region: NA  Edema: None noted      Height: Ht Readings from Last 1 Encounters:  07/09/13 '5\' 3"'  (1.6 m)    Weight: Wt Readings from Last 1 Encounters:  07/09/13 110 lb 0.2 oz (49.9 kg)    Ideal Body Weight: 115 lbs   % Ideal Body Weight: 96%  Wt Readings from Last 10 Encounters:  07/09/13 110 lb 0.2 oz (49.9 kg)  07/09/13 110 lb 0.2 oz (49.9 kg)    Usual Body Weight: 110 lbs per pt  % Usual Body Weight: 100%  BMI:  Body mass index is 19.49 kg/(m^2).  Estimated Nutritional Needs: Kcal: 1500-1700 Protein: 60-75g Fluid: 1.5-1.7L/day  Skin: Abdominal incision   Diet Order: NPO  EDUCATION NEEDS: -No education needs identified at this time   Intake/Output Summary (Last 24 hours) at 07/15/13 1139 Last data filed at 07/15/13 1000  Gross per 24 hour  Intake 2529.53 ml  Output   1300 ml  Net 1229.53 ml    Last BM: 5/17  Labs:   Recent Labs Lab 07/13/13 0455 07/14/13 0410 07/15/13 0350  NA 141 138 137  K 4.2 4.1 4.1  CL 107 105 102  CO2 '21 21 23  ' BUN '17 11 8  ' CREATININE 0.46* 0.41* 0.40*  CALCIUM 7.5* 7.5* 7.6*  GLUCOSE 98 88 92    CBG (last 3)  No results found for this basename: GLUCAP,  in the last 72 hours  Scheduled Meds: . acetaminophen  1,000 mg Intravenous Q6H  . enoxaparin (LOVENOX) injection  40 mg Subcutaneous Q24H  . ertapenem  1 g Intravenous Q24H  . famotidine (PEPCID) IV  20 mg Intravenous Q12H  . lip balm  1 application Topical BID  . metoprolol  2.5 mg Intravenous 4 times per day    Continuous Infusions: . 0.9 % NaCl with KCl 20 mEq / L 75 mL/hr at 07/14/13 2300  . 0.9 % NaCl with KCl 20 mEq / L    . Marland KitchenTPN (CLINIMIX-E) Adult     And  . fat emulsion      Past Medical History  Diagnosis Date  . Hypertension   . Depression     Past Surgical History  Procedure Laterality Date  . Oophorectomy    . Cardiac catheterization    . Laparotomy N/A 07/09/2013    Procedure: EXPLORATORY LAPAROTOMY, COLOSTOMY, SIGMOID COLECTOMY, HARTMANS PROCEDURE;  Surgeon:  Edward Jolly, MD;  Location: [facility];  Service: General;  Laterality: N/A;    Mikey College MS, [place], [place] Pager [phone] After Hours Pager

## 2013-07-15 NOTE — Consult Note (Signed)
.  WOC ostomy follow up Stoma type/location: LLQ Colostomy Stomal assessment/size: Slightly smaller than 1 and 3/4 inches (but not yet 1 and 5/8 inches), round, pink, moist and edematous.  OS at center Peristomal assessment: intact, clear Treatment options for stomal/peristomal skin: None indicated Output Small amount of soft brown stool in pouch. Ostomy pouching: 2pc., 2 and 1/4 inch pouching system. Education provided: patient and spouse observed pouch change and are asking appropriate questions.  Patient able to perform Lock and Roll closure, albeit does not recall doing it on 5/14. Reinforced that changing pouch is twice weekly and emptying pouch will be whenever pouch is 1/3 to 1/2 full of stool or flatus. Noted that patient will begin TPN today for ileus and that [facility] is monitoring labs for trends with WBCs. Enrolled patient in [facility] Discharge program: Yes [facility] nursing team will follow, and will remain available to this patient, the nursing, srugical and medical teams. Thanks, Maudie Flakes, MSN, RN, [place], [place], [place] [phone])

## 2013-07-15 NOTE — Progress Notes (Signed)
PARENTERAL NUTRITION CONSULT NOTE - INITIAL  Pharmacy Consult for TNA Indication: prolonged ileus  Allergies  Allergen Reactions  . Erythromycin Base Other (See Comments)    stomach pains   . Lidocaine   . Codeine Itching and Rash  . Penicillins Palpitations    Patient Measurements: Height: 5\' 3"  (160 cm) Weight: 110 lb 0.2 oz (49.9 kg) IBW/kg (Calculated) : 52.4  Vital Signs: Temp: 98.2 F (36.8 C) (05/19 0512) Temp src: Oral (05/19 0512) BP: 131/80 mmHg (05/19 0512) Pulse Rate: 96 (05/19 0512) Intake/Output from previous day: 05/18 0701 - 05/19 0700 In: 3325.8 [I.V.:2125.8; IV Piggyback:1200] Out: 9357 [Urine:1325; Emesis/NG output:400] Intake/Output from this shift:    Labs:  Recent Labs  07/13/13 0455 07/14/13 0410 07/15/13 0350  WBC 19.3* 21.1* 16.4*  HGB 10.2* 10.4* 10.4*  HCT 30.1* 31.7* 30.5*  PLT 327 363 383     Recent Labs  07/13/13 0455 07/14/13 0410 07/15/13 0350  NA 141 138 137  K 4.2 4.1 4.1  CL 107 105 102  CO2 21 21 23   GLUCOSE 98 88 92  BUN 17 11 8   CREATININE 0.46* 0.41* 0.40*  CALCIUM 7.5* 7.5* 7.6*   Estimated Creatinine Clearance: 56.7 ml/min (by C-G formula based on Cr of 0.4).   No results found for this basename: GLUCAP,  in the last 72 hours  Medical History: Past Medical History  Diagnosis Date  . Hypertension   . Depression     Medications:  Scheduled:  . acetaminophen  1,000 mg Intravenous Q6H  . enoxaparin (LOVENOX) injection  40 mg Subcutaneous Q24H  . ertapenem  1 g Intravenous Q24H  . famotidine (PEPCID) IV  20 mg Intravenous Q12H  . lip balm  1 application Topical BID  . metoprolol  2.5 mg Intravenous 4 times per day   Infusions:  . 0.9 % NaCl with KCl 20 mEq / L 75 mL/hr at 07/14/13 2300   PRN: acetaminophen, alum & mag hydroxide-simeth, bisacodyl, diphenhydrAMINE, fentaNYL, lactated ringers, magic mouthwash, metoCLOPramide (REGLAN) injection, ondansetron (ZOFRAN) IV, ondansetron (ZOFRAN) IV,  promethazine  Current Nutrition:  NPO  Assessment: 53 yof s/p ex lap, colostomy, sigmoid colectomy, hartmans procedure on 5/13. Patient unable to tolerate clear liquids and has not eaten since 5/10. Pharmacy consulted to start TPN for prolonged ileus.   Nutritional Goals:  Per PharmD: Clinimix E 5/15 at a goal rate of 32ml/hr and Lipids at 10 ml/hr to provide 1502 kCal daily and 72 grams of protein per day  Plan:  1) At 1800, start Clinimix E 5/15 at 40 ml/hr  2) IV Lipids at 10 ml/hr  3) Reduce IVF to 74ml/hr 4) Daily multivitamins and trace elements added to TNA  5) TNA labs on Monday and Thursdays as per protocol   Kizzie Furnish, PharmD Pager: [phone] 07/15/2013 10:10 AM

## 2013-07-16 LAB — DIFFERENTIAL
Basophils Absolute: 0 10*3/uL (ref 0.0–0.1)
Basophils Relative: 0 % (ref 0–1)
EOS ABS: 0.2 10*3/uL (ref 0.0–0.7)
EOS PCT: 2 % (ref 0–5)
LYMPHS ABS: 0.6 10*3/uL — AB (ref 0.7–4.0)
Lymphocytes Relative: 4 % — ABNORMAL LOW (ref 12–46)
MONOS PCT: 6 % (ref 3–12)
Monocytes Absolute: 0.9 10*3/uL (ref 0.1–1.0)
Neutro Abs: 13.2 10*3/uL — ABNORMAL HIGH (ref 1.7–7.7)
Neutrophils Relative %: 88 % — ABNORMAL HIGH (ref 43–77)

## 2013-07-16 LAB — COMPREHENSIVE METABOLIC PANEL
ALK PHOS: 60 U/L (ref 39–117)
ALT: 13 U/L (ref 0–35)
AST: 19 U/L (ref 0–37)
Albumin: 1.6 g/dL — ABNORMAL LOW (ref 3.5–5.2)
BUN: 8 mg/dL (ref 6–23)
CO2: 27 mEq/L (ref 19–32)
Calcium: 7.8 mg/dL — ABNORMAL LOW (ref 8.4–10.5)
Chloride: 102 mEq/L (ref 96–112)
Creatinine, Ser: 0.39 mg/dL — ABNORMAL LOW (ref 0.50–1.10)
GFR calc non Af Amer: >90 mL/min (ref >90)
GLUCOSE: 167 mg/dL — AB (ref 70–99)
POTASSIUM: 3.6 meq/L — AB (ref 3.7–5.3)
Sodium: 137 mEq/L (ref 137–147)
TOTAL PROTEIN: 4.4 g/dL — AB (ref 6.0–8.3)
Total Bilirubin: 0.3 mg/dL (ref 0.3–1.2)

## 2013-07-16 LAB — CBC
HCT: 29.5 % — ABNORMAL LOW (ref 36.0–46.0)
HEMOGLOBIN: 10 g/dL — AB (ref 12.0–15.0)
MCH: 29.4 pg (ref 26.0–34.0)
MCHC: 33.9 g/dL (ref 30.0–36.0)
MCV: 86.8 fL (ref 78.0–100.0)
Platelets: 391 10*3/uL (ref 150–400)
RBC: 3.4 MIL/uL — ABNORMAL LOW (ref 3.87–5.11)
RDW: 13.2 % (ref 11.5–15.5)
WBC: 15 10*3/uL — ABNORMAL HIGH (ref 4.0–10.5)

## 2013-07-16 LAB — GLUCOSE, CAPILLARY
Glucose-Capillary: 170 mg/dL — ABNORMAL HIGH (ref 70–99)
Glucose-Capillary: 156 mg/dL — ABNORMAL HIGH (ref 70–99)

## 2013-07-16 LAB — MAGNESIUM: MAGNESIUM: 1.9 mg/dL (ref 1.5–2.5)

## 2013-07-16 LAB — TRIGLYCERIDES: TRIGLYCERIDES: 128 mg/dL (ref <150)

## 2013-07-16 LAB — PREALBUMIN: PREALBUMIN: 5.4 mg/dL — AB (ref 17.0–34.0)

## 2013-07-16 LAB — PHOSPHORUS: Phosphorus: 2.6 mg/dL (ref 2.3–4.6)

## 2013-07-16 MED ORDER — POTASSIUM CHLORIDE IN NACL 20-0.9 MEQ/L-% IV SOLN
INTRAVENOUS | Status: DC
  Administered 2013-07-17 – 2013-07-20 (×2): via INTRAVENOUS
  Filled 2013-07-16 (×3): qty 1000

## 2013-07-16 MED ORDER — TRACE MINERALS CR-CU-F-FE-I-MN-MO-SE-ZN IV SOLN
INTRAVENOUS | Status: DC
  Filled 2013-07-16: qty 1000

## 2013-07-16 MED ORDER — POTASSIUM CHLORIDE 10 MEQ/100ML IV SOLN
10.0000 meq | INTRAVENOUS | Status: AC
  Administered 2013-07-16 (×2): 10 meq via INTRAVENOUS
  Filled 2013-07-16 (×2): qty 100

## 2013-07-16 MED ORDER — TRACE MINERALS CR-CU-F-FE-I-MN-MO-SE-ZN IV SOLN
INTRAVENOUS | Status: AC
  Administered 2013-07-16: 17:00:00 via INTRAVENOUS
  Filled 2013-07-16: qty 2000

## 2013-07-16 MED ORDER — FAT EMULSION 20 % IV EMUL
240.0000 mL | INTRAVENOUS | Status: DC
  Filled 2013-07-16: qty 250

## 2013-07-16 MED ORDER — INSULIN ASPART 100 UNIT/ML ~~LOC~~ SOLN
0.0000 [IU] | Freq: Four times a day (QID) | SUBCUTANEOUS | Status: DC
  Administered 2013-07-16: 2 [IU] via SUBCUTANEOUS
  Administered 2013-07-16: 1 [IU] via SUBCUTANEOUS
  Administered 2013-07-17: 2 [IU] via SUBCUTANEOUS
  Administered 2013-07-17: 1 [IU] via SUBCUTANEOUS
  Administered 2013-07-17 (×2): 2 [IU] via SUBCUTANEOUS
  Administered 2013-07-18 – 2013-07-22 (×15): 1 [IU] via SUBCUTANEOUS

## 2013-07-16 MED ORDER — FAT EMULSION 20 % IV EMUL
240.0000 mL | INTRAVENOUS | Status: AC
  Administered 2013-07-16: 240 mL via INTRAVENOUS
  Filled 2013-07-16: qty 250

## 2013-07-16 NOTE — Progress Notes (Signed)
ATTENDING ADDENDUM:  I personally reviewed patient's record, examined the patient, and formulated the following assessment and plan:  PT with nausea this afternoon.  Abd distended.  If no improvement by tomorrow with repeat CT scan.

## 2013-07-16 NOTE — Progress Notes (Signed)
7 Days Post-Op  Subjective: She is still pretty distended, had some fluid come up early this AM, few ounces, with some blood.  She is still very distended and she is leaking some peritoneal fluid now.  I noted some at the base when I went to get dressings to repack.  The open wound is OK still some darker stuff coming from it with dressing change.  She has a fair amount of gas and stool in the bag today.  Objective: Vital signs in last 24 hours: Temp:  [98 F (36.7 C)-99 F (37.2 C)] 98.3 F (36.8 C) (05/20 0501) Pulse Rate:  [84-93] 93 (05/20 0501) Resp:  [16-18] 16 (05/20 0501) BP: (117-133)/(66-79) 133/79 mmHg (05/20 0501) SpO2:  [92 %-95 %] 92 % (05/20 0501) Last BM Date: 07/16/13 Nothing PO recorded. No emesis 125 stool recorded. Afebrile, VSS K+ 3.6 Prealbumin 5.6 WBC dow to 15K Intake/Output from previous day: 05/19 0701 - 05/20 0700 In: 851 [I.V.:601; IV Piggyback:250] Out: 1200 [Urine:1075; Stool:125] Intake/Output this shift: Total I/O In: -  Out: 200 [Urine:200]  General appearance: alert, cooperative, no distress and tired of being swollen up. Resp: clear to auscultation bilaterally Cardio: regular rate and rhythm  GI: distended, few BS, stool and gas in ostomy bag.  Open wound still producing some purulent material on dressing.  some peritoneal fluid in wound noted this AM. Extremities: some mild edema  Lab Results:   Recent Labs  07/15/13 0350 07/16/13 0445  WBC 16.4* 15.0*  HGB 10.4* 10.0*  HCT 30.5* 29.5*  PLT 383 391    BMET  Recent Labs  07/15/13 0350 07/16/13 0445  NA 137 137  K 4.1 3.6*  CL 102 102  CO2 23 27  GLUCOSE 92 167*  BUN 8 8  CREATININE 0.40* 0.39*  CALCIUM 7.6* 7.8*   PT/INR No results found for this basename: LABPROT, INR,  in the last 72 hours   Recent Labs Lab 07/09/13 1415 07/10/13 0312 07/16/13 0445  AST 56* 29 19  ALT 33 18 13  ALKPHOS 108 67 60  BILITOT 1.7* 1.0 0.3  PROT 7.5 4.5* 4.4*  ALBUMIN 2.9*  1.7* 1.6*     Lipase     Component Value Date/Time   LIPASE 11 07/09/2013 1415     Studies/Results: No results found.  Medications: . antiseptic oral rinse  15 mL Mouth Rinse q12n4p  . chlorhexidine  15 mL Mouth Rinse BID  . enoxaparin (LOVENOX) injection  40 mg Subcutaneous Q24H  . ertapenem  1 g Intravenous Q24H  . famotidine (PEPCID) IV  20 mg Intravenous Q12H  . insulin aspart  0-9 Units Subcutaneous 4 times per day  . lip balm  1 application Topical BID  . metoprolol  2.5 mg Intravenous 4 times per day  . potassium chloride  10 mEq Intravenous Q1 Hr x 2    Assessment/Plan 1. Stercoral perforation, s/p EXPLORATORY LAPAROTOMY, COLOSTOMY, SIGMOID COLECTOMY, HARTMANS PROCEDURE, 07/09/2013, Edward Jolly, MD.  2. HTN  3. Hx of MVP and palpatations  4. Depression  5. POST OP leukocytosis, day 7 today of Invanz  6.  Lovenox for DVT 7.  Post op ileus/peritonitis  Plan:  Continue antibiotics, continue TNA, dressing changes as currently ordered.  Change pepcid to PPI.  See how she does with clear liquids.  I am still concerned about possible intraabdominal abscess.       LOS: 7 days    Kristen Reyes 07/16/2013

## 2013-07-16 NOTE — Progress Notes (Signed)
PARENTERAL NUTRITION CONSULT NOTE - INITIAL  Pharmacy Consult for TNA Indication: prolonged ileus  Allergies  Allergen Reactions  . Erythromycin Base Other (See Comments)    stomach pains   . Lidocaine   . Codeine Itching and Rash  . Penicillins Palpitations    Patient Measurements: Height: 5\' 3"  (160 cm) Weight: 110 lb 0.2 oz (49.9 kg) IBW/kg (Calculated) : 52.4  Vital Signs: Temp: 98.3 F (36.8 C) (05/20 0501) Temp src: Oral (05/20 0501) BP: 133/79 mmHg (05/20 0501) Pulse Rate: 93 (05/20 0501) Intake/Output from previous day: 05/19 0701 - 05/20 0700 In: 851 [I.V.:601; IV Piggyback:250] Out: 1200 [Urine:1075; Stool:125] Intake/Output from this shift: Total I/O In: -  Out: 200 [Urine:200]  Labs:  Recent Labs  07/14/13 0410 07/15/13 0350 07/16/13 0445  WBC 21.1* 16.4* 15.0*  HGB 10.4* 10.4* 10.0*  HCT 31.7* 30.5* 29.5*  PLT 363 383 391     Recent Labs  07/14/13 0410 07/15/13 0350 07/15/13 0845 07/16/13 0445  NA 138 137  --  137  K 4.1 4.1  --  3.6*  CL 105 102  --  102  CO2 21 23  --  27  GLUCOSE 88 92  --  167*  BUN 11 8  --  8  CREATININE 0.41* 0.40*  --  0.39*  CALCIUM 7.5* 7.6*  --  7.8*  MG  --   --   --  1.9  PHOS  --   --   --  2.6  PROT  --   --   --  4.4*  ALBUMIN  --   --   --  1.6*  AST  --   --   --  19  ALT  --   --   --  13  ALKPHOS  --   --   --  60  BILITOT  --   --   --  0.3  PREALBUMIN  --   --  5.6*  --   TRIG  --   --   --  128   Estimated Creatinine Clearance: 56.7 ml/min (by C-G formula based on Cr of 0.39).   No results found for this basename: GLUCAP,  in the last 72 hours  Medical History: Past Medical History  Diagnosis Date  . Hypertension   . Depression     Medications:  Scheduled:  . antiseptic oral rinse  15 mL Mouth Rinse q12n4p  . chlorhexidine  15 mL Mouth Rinse BID  . enoxaparin (LOVENOX) injection  40 mg Subcutaneous Q24H  . ertapenem  1 g Intravenous Q24H  . famotidine (PEPCID) IV  20 mg  Intravenous Q12H  . insulin aspart  0-9 Units Subcutaneous 4 times per day  . lip balm  1 application Topical BID  . metoprolol  2.5 mg Intravenous 4 times per day  . potassium chloride  10 mEq Intravenous Q1 Hr x 2   Infusions:  . 0.9 % NaCl with KCl 20 mEq / L    . Marland KitchenTPN (CLINIMIX-E) Adult 40 mL/hr at 07/15/13 1755   And  . fat emulsion 240 mL (07/15/13 1754)   PRN: acetaminophen, alum & mag hydroxide-simeth, bisacodyl, diphenhydrAMINE, fentaNYL, lactated ringers, magic mouthwash, metoCLOPramide (REGLAN) injection, ondansetron (ZOFRAN) IV, ondansetron (ZOFRAN) IV, promethazine, sodium chloride  Nutritional Goals:  RD Recommendations: Kcal 1500-1700, Protein 60-75g, Fluid 1.5-1.7L/day Clinimix-E 5/15 at a goal rate of 17ml/hr + 20% fat emulsion at 10 ml/hr to provide: 1502 kCal/day, 72 g/day protein Current nutrition:  Diet: NPO IVF:NS + KCl 20 mEq/L @ 35 ml/min  CBGs & Insulin requirements past 24 hours:  CBG 5/20 0445: 167 Assessment:  46 yof s/p ex lap, colostomy, sigmoid colectomy, hartmans procedure on 5/13. Patient unable to tolerate clear liquids and has not eaten since 5/10. Pharmacy consulted to start TPN for prolonged ileus.  TNA started 5/19 with pharmacy to assist. Patient at risk for refeeding syndrome due to length of time without eating.  5/19 TPN day# 8:  Electrolytes: Na and Mg WNL.K 3.6 Corr Ca 9.7  Renal function: SCr and BUN WNL, stable.  Hepatic function: all LFTs WNL. Pre-Albumin: baseline 5.6 (5/19)  TG: 128  Glucose: 167  Plan:  1) At 1800 today, increase Clinimix E 5/15 to 50 ml/hr   TNA to contain standard multivitamins and trace elements daily. 2) IV Lipids at 10 ml/hr  3) Reduce IVF to 42ml/hr 4) Replace K with 103meq IV Potassium 5) Start sensitive SSI Q6H 6) Daily multivitamins and trace elements added to TNA  7) TNA labs on Monday and Thursdays as per protocol   Kizzie Furnish, PharmD Pager: [phone] 07/16/2013 8:34 AM

## 2013-07-17 ENCOUNTER — Encounter (HOSPITAL_COMMUNITY): Payer: Self-pay | Admitting: Radiology

## 2013-07-17 ENCOUNTER — Inpatient Hospital Stay (HOSPITAL_COMMUNITY): Payer: Managed Care, Other (non HMO)

## 2013-07-17 DIAGNOSIS — R112 Nausea with vomiting, unspecified: Secondary | ICD-10-CM

## 2013-07-17 LAB — GLUCOSE, CAPILLARY
GLUCOSE-CAPILLARY: 107 mg/dL — AB (ref 70–99)
GLUCOSE-CAPILLARY: 127 mg/dL — AB (ref 70–99)
GLUCOSE-CAPILLARY: 174 mg/dL — AB (ref 70–99)
Glucose-Capillary: 151 mg/dL — ABNORMAL HIGH (ref 70–99)
Glucose-Capillary: 155 mg/dL — ABNORMAL HIGH (ref 70–99)
Glucose-Capillary: 136 mg/dL — ABNORMAL HIGH (ref 70–99)

## 2013-07-17 LAB — CBC
HEMATOCRIT: 30.1 % — AB (ref 36.0–46.0)
HEMOGLOBIN: 10.1 g/dL — AB (ref 12.0–15.0)
MCH: 29.1 pg (ref 26.0–34.0)
MCHC: 33.6 g/dL (ref 30.0–36.0)
MCV: 86.7 fL (ref 78.0–100.0)
Platelets: 391 10*3/uL (ref 150–400)
RBC: 3.47 MIL/uL — AB (ref 3.87–5.11)
RDW: 13.2 % (ref 11.5–15.5)
WBC: 15.3 10*3/uL — ABNORMAL HIGH (ref 4.0–10.5)

## 2013-07-17 LAB — COMPREHENSIVE METABOLIC PANEL
ALBUMIN: 1.6 g/dL — AB (ref 3.5–5.2)
ALT: 13 U/L (ref 0–35)
AST: 22 U/L (ref 0–37)
Alkaline Phosphatase: 71 U/L (ref 39–117)
BUN: 11 mg/dL (ref 6–23)
CALCIUM: 8.3 mg/dL — AB (ref 8.4–10.5)
CHLORIDE: 101 meq/L (ref 96–112)
CO2: 30 mEq/L (ref 19–32)
CREATININE: 0.39 mg/dL — AB (ref 0.50–1.10)
GFR calc Af Amer: >90 mL/min (ref >90)
GFR calc non Af Amer: >90 mL/min (ref >90)
Glucose, Bld: 148 mg/dL — ABNORMAL HIGH (ref 70–99)
Potassium: 3.8 mEq/L (ref 3.7–5.3)
Sodium: 140 mEq/L (ref 137–147)
Total Bilirubin: 0.2 mg/dL — ABNORMAL LOW (ref 0.3–1.2)
Total Protein: 4.8 g/dL — ABNORMAL LOW (ref 6.0–8.3)

## 2013-07-17 LAB — MAGNESIUM: Magnesium: 2 mg/dL (ref 1.5–2.5)

## 2013-07-17 LAB — PHOSPHORUS: PHOSPHORUS: 2.9 mg/dL (ref 2.3–4.6)

## 2013-07-17 MED ORDER — MENTHOL 3 MG MT LOZG
1.0000 | LOZENGE | OROMUCOSAL | Status: DC | PRN
  Filled 2013-07-17: qty 9

## 2013-07-17 MED ORDER — IOHEXOL 300 MG/ML  SOLN: 50.0000 mL | INTRAMUSCULAR | Status: AC

## 2013-07-17 MED ORDER — PHENOL 1.4 % MT LIQD
1.0000 | OROMUCOSAL | Status: DC | PRN
  Filled 2013-07-17: qty 177

## 2013-07-17 MED ORDER — LACTATED RINGERS IV BOLUS (SEPSIS)
1000.0000 mL | Freq: Once | INTRAVENOUS | Status: AC
  Administered 2013-07-17: 1000 mL via INTRAVENOUS

## 2013-07-17 MED ORDER — TRACE MINERALS CR-CU-F-FE-I-MN-MO-SE-ZN IV SOLN
INTRAVENOUS | Status: AC
  Administered 2013-07-17: 17:00:00 via INTRAVENOUS
  Filled 2013-07-17: qty 2000

## 2013-07-17 MED ORDER — IOHEXOL 300 MG/ML  SOLN
100.0000 mL | Freq: Once | INTRAMUSCULAR | Status: AC | PRN
  Administered 2013-07-17: 100 mL via INTRAVENOUS

## 2013-07-17 MED ORDER — FAT EMULSION 20 % IV EMUL
250.0000 mL | INTRAVENOUS | Status: AC
  Administered 2013-07-17: 250 mL via INTRAVENOUS
  Filled 2013-07-17: qty 250

## 2013-07-17 NOTE — Progress Notes (Signed)
Physical Therapy Treatment Patient Details Name: Kristen Reyes MRN: [id number] DOB: 07-25-1950 Today's Date: 07/17/2013    History of Present Illness 62 yo female s/p exp lap, colostomy, Hartmann's procedure 5/13.     PT Comments    Assisted pt OOB to amb full unit.  No c/o nausea.  Mild c/o feeling dizzy.   Follow Up Recommendations  No PT follow up;Supervision - Intermittent     Equipment Recommendations  None recommended by PT    Recommendations for Other Services       Precautions / Restrictions Precautions Precaution Comments: colostomy, abd surgery Restrictions Weight Bearing Restrictions: No    Mobility  Bed Mobility Overal bed mobility: Needs Assistance Bed Mobility: Supine to Sit;Sit to Supine     Supine to sit: Min assist;HOB elevated Sit to supine: Min assist;HOB elevated   General bed mobility comments: assist for trunk, LEs.   Transfers Overall transfer level: Needs assistance Equipment used: None Transfers: Sit to/from Stand Sit to Stand: Min guard         General transfer comment: Assist to rise, stabilize, control descent.   Ambulation/Gait Ambulation/Gait assistance: Min guard Ambulation Distance (Feet): 500 Feet Assistive device: None (holding to IV pole) Gait Pattern/deviations: Decreased stride length Gait velocity: decreased   General Gait Details: close guard for safety. slow gait speed.    Stairs            Wheelchair Mobility    Modified Rankin (Stroke Patients Only)       Balance                                    Cognition                            Exercises      General Comments        Pertinent Vitals/Pain     Home Living                      Prior Function            PT Goals (current goals can now be found in the care plan section) Progress towards PT goals: Progressing toward goals    Frequency  Min 3X/week    PT Plan      Co-evaluation              End of Session Equipment Utilized During Treatment: Gait belt Activity Tolerance: Patient tolerated treatment well Patient left: in bed;with call bell/phone within reach;with family/visitor present     Time: 2355-7322 PT Time Calculation (min): 24 min  Charges:  $Gait Training: 23-37 mins                    G Codes:      Kristen Reyes  PTA [facility]  Acute  Rehab Pager      [phone]

## 2013-07-17 NOTE — Progress Notes (Signed)
Patient vomited  dark green emesis x 4 - Rockne Coons ,MD notified.

## 2013-07-17 NOTE — Progress Notes (Signed)
8 Days Post-Op  Subjective: Nausea and vomiting all night, she has an ostomy bag full of gastric drainage, she feels pretty bad.  Objective: Vital signs in last 24 hours: Temp:  [98.3 F (36.8 C)-98.8 F (37.1 C)] 98.7 F (37.1 C) (05/21 0601) Pulse Rate:  [84-102] 102 (05/21 0601) Resp:  [18] 18 (05/21 0601) BP: (111-119)/(70-74) 116/74 mmHg (05/21 0601) SpO2:  [92 %-95 %] 92 % (05/21 0601) Last BM Date: 07/16/13 360 PO 1900 Emeisis 800 stool Afebrile, VSS Creatinine stable, WBC still up Intake/Output from previous day: 05/20 0701 - 05/21 0700 In: 1155.3 [P.O.:360; I.V.:345.3; IV Piggyback:450] Out: 4300 [Urine:1600; Emesis/NG output:1900; Stool:800] Intake/Output this shift:    General appearance: alert, cooperative, no distress and tired. Resp: clear to auscultation bilaterally GI: distended still tender, more purulent drainage from the base of the open wound.  Few BS, drainage from ostomy looks like gastric drainage.  Lab Results:   Recent Labs  07/16/13 0445 07/17/13 0445  WBC 15.0* 15.3*  HGB 10.0* 10.1*  HCT 29.5* 30.1*  PLT 391 391    BMET  Recent Labs  07/16/13 0445 07/17/13 0445  NA 137 140  K 3.6* 3.8  CL 102 101  CO2 27 30  GLUCOSE 167* 148*  BUN 8 11  CREATININE 0.39* 0.39*  CALCIUM 7.8* 8.3*   PT/INR No results found for this basename: LABPROT, INR,  in the last 72 hours   Recent Labs Lab 07/16/13 0445 07/17/13 0445  AST 19 22  ALT 13 13  ALKPHOS 60 71  BILITOT 0.3 0.2*  PROT 4.4* 4.8*  ALBUMIN 1.6* 1.6*     Lipase     Component Value Date/Time   LIPASE 11 07/09/2013 1415     Studies/Results: No results found.  Medications: . antiseptic oral rinse  15 mL Mouth Rinse q12n4p  . chlorhexidine  15 mL Mouth Rinse BID  . enoxaparin (LOVENOX) injection  40 mg Subcutaneous Q24H  . ertapenem  1 g Intravenous Q24H  . famotidine (PEPCID) IV  20 mg Intravenous Q12H  . insulin aspart  0-9 Units Subcutaneous 4 times per day  .  lip balm  1 application Topical BID  . metoprolol  2.5 mg Intravenous 4 times per day    Assessment/Plan 1. Stercoral perforation, s/p EXPLORATORY LAPAROTOMY, COLOSTOMY, SIGMOID COLECTOMY, HARTMANS PROCEDURE, 07/09/2013, Edward Jolly, MD.  2. HTN  3. Hx of MVP and palpatations  4. Depression  5. POST OP leukocytosis, day 7 today of Invanz  6. Lovenox for DVT  7. Post op ileus/peritonitis   Plan:  Insert NG, CT scan of abdomen and pelvis, continue antibiotics.       LOS: 8 days    Earnstine Regal 07/17/2013

## 2013-07-17 NOTE — Progress Notes (Signed)
PARENTERAL NUTRITION CONSULT NOTE - Follow Up  Pharmacy Consult for TNA Indication: prolonged ileus  Allergies  Allergen Reactions  . Erythromycin Base Other (See Comments)    stomach pains   . Lidocaine   . Codeine Itching and Rash  . Penicillins Palpitations    Patient Measurements: Height: 5\' 3"  (160 cm) Weight: 110 lb 0.2 oz (49.9 kg) IBW/kg (Calculated) : 52.4  Vital Signs: Temp: 98.7 F (37.1 C) (05/21 0601) Temp src: Oral (05/21 0601) BP: 116/74 mmHg (05/21 0601) Pulse Rate: 102 (05/21 0601) Intake/Output from previous day: 05/20 0701 - 05/21 0700 In: 1155.3 [P.O.:360; I.V.:345.3; IV Piggyback:450] Out: 4300 [Urine:1600; Emesis/NG output:1900; Stool:800] Intake/Output from this shift:    Labs:  Recent Labs  07/15/13 0350 07/16/13 0445 07/17/13 0445  WBC 16.4* 15.0* 15.3*  HGB 10.4* 10.0* 10.1*  HCT 30.5* 29.5* 30.1*  PLT 383 391 391     Recent Labs  07/15/13 0350 07/15/13 0845 07/16/13 0445 07/17/13 0445  NA 137  --  137 140  K 4.1  --  3.6* 3.8  CL 102  --  102 101  CO2 23  --  27 30  GLUCOSE 92  --  167* 148*  BUN 8  --  8 11  CREATININE 0.40*  --  0.39* 0.39*  CALCIUM 7.6*  --  7.8* 8.3*  MG  --   --  1.9 2.0  PHOS  --   --  2.6 2.9  PROT  --   --  4.4* 4.8*  ALBUMIN  --   --  1.6* 1.6*  AST  --   --  19 22  ALT  --   --  13 13  ALKPHOS  --   --  60 71  BILITOT  --   --  0.3 0.2*  PREALBUMIN  --  5.6* 5.4*  --   TRIG  --   --  128  --   Corrected Calcium = 10.2 Estimated Creatinine Clearance: 56.7 ml/min (by C-G formula based on Cr of 0.39).    Recent Labs  07/16/13 1750 07/17/13 0003 07/17/13 0527  GLUCAP 156* 174* 151*    Medical History: Past Medical History  Diagnosis Date  . Hypertension   . Depression     Medications:  Scheduled:  . antiseptic oral rinse  15 mL Mouth Rinse q12n4p  . chlorhexidine  15 mL Mouth Rinse BID  . enoxaparin (LOVENOX) injection  40 mg Subcutaneous Q24H  . ertapenem  1 g Intravenous  Q24H  . famotidine (PEPCID) IV  20 mg Intravenous Q12H  . insulin aspart  0-9 Units Subcutaneous 4 times per day  . lip balm  1 application Topical BID  . metoprolol  2.5 mg Intravenous 4 times per day   Infusions:  . 0.9 % NaCl with KCl 20 mEq / L 20 mL/hr at 07/16/13 1752  . Marland KitchenTPN (CLINIMIX-E) Adult 50 mL/hr at 07/16/13 1700   And  . fat emulsion 240 mL (07/16/13 1700)   PRN: acetaminophen, alum & mag hydroxide-simeth, bisacodyl, diphenhydrAMINE, fentaNYL, magic mouthwash, metoCLOPramide (REGLAN) injection, ondansetron (ZOFRAN) IV, ondansetron (ZOFRAN) IV, promethazine, sodium chloride  Nutritional Goals:  RD Recommendations: Kcal 1500-1700, Protein 60-75g, Fluid 1.5-1.7L/day Clinimix-E 5/15 at a goal rate of 68ml/hr + 20% fat emulsion at 10 ml/hr to provide: 1502 kCal/day, 72 g/day protein  Current nutrition:  Diet: CL - pt reports vomiting last night Clinimix-E 5/15 at 50 mL/hr Fat Emulsion 20% at 10 mL/hr IVF:NS + KCl 20  mEq/L @ 20 ml/min   CBGs & Insulin requirements past 24 hours:  CBGs 151-174 Novolog 7 units sliding scale coverage (on sensitive scale q6h)  Assessment:  63 yo F s/p ex lap, colostomy, sigmoid colectomy, Hartmann's procedure on 5/13. Patient was unable to tolerate clear liquids and had not eaten since 5/10. TNA was started 5/19 with pharmacy to assist. Patient at risk for refeeding syndrome due to length of time without eating.   Not tolerating clear liquids.  5/21 TPN day# 2:  Electrolytes: WNL Renal function: SCr and BUN below ULN, stable. Hepatic function: all LFTs below ULN. Pre-Albumin: baseline 5.6 (5/19), 5.4 (5/20) TG: 128 (5/20) Glucose: CBGs < 180.  No hx of DM.   Plan:  1) At 1800 today, increase Clinimix E 5/15 to 60 ml/hr (goal rate)  TPN to contain standard multivitamins and trace elements daily. 2) Continue IV Lipids 20% at 10 ml/hr  3) Continue Novolog SSI sensitive scale q6h coverage 4) BMet, Mg, Phos tomorrow. 5) TNA labs on Monday  and Thursdays as per protocol   Clayburn Pert, PharmD, BCPS Pager: [phone] 07/17/2013  7:47 AM

## 2013-07-17 NOTE — Progress Notes (Signed)
ATTENDING ADDENDUM:  I personally reviewed patient's record, examined the patient, and formulated the following assessment and plan:  NG in place. ~868ml out.  Feels better.  Will get CT today to eval for abd abscess.  Cont TPN

## 2013-07-18 ENCOUNTER — Other Ambulatory Visit (HOSPITAL_COMMUNITY): Payer: Managed Care, Other (non HMO)

## 2013-07-18 ENCOUNTER — Inpatient Hospital Stay (HOSPITAL_COMMUNITY): Payer: Managed Care, Other (non HMO)

## 2013-07-18 ENCOUNTER — Encounter (HOSPITAL_COMMUNITY): Payer: Self-pay | Admitting: Radiology

## 2013-07-18 LAB — GLUCOSE, CAPILLARY
GLUCOSE-CAPILLARY: 124 mg/dL — AB (ref 70–99)
Glucose-Capillary: 136 mg/dL — ABNORMAL HIGH (ref 70–99)
Glucose-Capillary: 129 mg/dL — ABNORMAL HIGH (ref 70–99)

## 2013-07-18 LAB — CBC
HCT: 27.7 % — ABNORMAL LOW (ref 36.0–46.0)
Hemoglobin: 9.3 g/dL — ABNORMAL LOW (ref 12.0–15.0)
MCH: 29.2 pg (ref 26.0–34.0)
MCHC: 33.6 g/dL (ref 30.0–36.0)
MCV: 86.8 fL (ref 78.0–100.0)
PLATELETS: 379 10*3/uL (ref 150–400)
RBC: 3.19 MIL/uL — ABNORMAL LOW (ref 3.87–5.11)
RDW: 13.4 % (ref 11.5–15.5)
WBC: 11.7 10*3/uL — ABNORMAL HIGH (ref 4.0–10.5)

## 2013-07-18 LAB — PHOSPHORUS: Phosphorus: 3.6 mg/dL (ref 2.3–4.6)

## 2013-07-18 LAB — MAGNESIUM: Magnesium: 1.9 mg/dL (ref 1.5–2.5)

## 2013-07-18 LAB — BASIC METABOLIC PANEL
BUN: 10 mg/dL (ref 6–23)
CALCIUM: 8.2 mg/dL — AB (ref 8.4–10.5)
CO2: 28 mEq/L (ref 19–32)
Chloride: 102 mEq/L (ref 96–112)
Creatinine, Ser: 0.37 mg/dL — ABNORMAL LOW (ref 0.50–1.10)
GLUCOSE: 130 mg/dL — AB (ref 70–99)
POTASSIUM: 3.9 meq/L (ref 3.7–5.3)
SODIUM: 138 meq/L (ref 137–147)

## 2013-07-18 MED ORDER — MIDAZOLAM HCL 2 MG/2ML IJ SOLN
INTRAMUSCULAR | Status: AC | PRN
  Administered 2013-07-18 (×2): 1 mg via INTRAVENOUS

## 2013-07-18 MED ORDER — SODIUM CHLORIDE 0.9 % IV BOLUS (SEPSIS)
1000.0000 mL | Freq: Once | INTRAVENOUS | Status: AC
  Administered 2013-07-18: 1000 mL via INTRAVENOUS

## 2013-07-18 MED ORDER — MIDAZOLAM HCL 2 MG/2ML IJ SOLN
INTRAMUSCULAR | Status: AC
  Filled 2013-07-18: qty 2

## 2013-07-18 MED ORDER — FENTANYL CITRATE 0.05 MG/ML IJ SOLN
INTRAMUSCULAR | Status: AC
  Filled 2013-07-18: qty 2

## 2013-07-18 MED ORDER — FENTANYL CITRATE 0.05 MG/ML IJ SOLN
INTRAMUSCULAR | Status: AC | PRN
  Administered 2013-07-18: 100 ug via INTRAVENOUS

## 2013-07-18 MED ORDER — TRACE MINERALS CR-CU-F-FE-I-MN-MO-SE-ZN IV SOLN
INTRAVENOUS | Status: AC
  Administered 2013-07-18: 18:00:00 via INTRAVENOUS
  Filled 2013-07-18: qty 2000

## 2013-07-18 MED ORDER — FAT EMULSION 20 % IV EMUL
250.0000 mL | INTRAVENOUS | Status: AC
  Administered 2013-07-18: 250 mL via INTRAVENOUS
  Filled 2013-07-18: qty 250

## 2013-07-18 NOTE — Progress Notes (Signed)
ATTENDING ADDENDUM:  I personally reviewed patient's record, examined the patient, and formulated the following assessment and plan:  Reviewed CT.  Will ask IR to place pelvic drain.  Cont NG for now.  Cont Invanz.  Cont TPN for severe protein calorie malnutrition.

## 2013-07-18 NOTE — Procedures (Signed)
Interventional Radiology Procedure Note  Procedure: 1.) 72F drain placed in the deep cul-de-sac collection via right transgluteal approach.  Approximately 65 mL turbid serosanguinous fluid aspirated. 2.) 33F drain placed into right medial inguinal recess collection.  15 mL frankly purulent fluid aspirated.  Cavity lavaged with sterile saline. 3.) Samples sent for gram stain and cx Complications: None immediate Recommendations: - Keep both drains to JP bulb suction - Follow Cx results - Repeat CT with IV and PO contrast prior to drain removal   Signed,  Criselda Peaches, MD [facility]

## 2013-07-18 NOTE — Progress Notes (Signed)
PARENTERAL NUTRITION CONSULT NOTE - Follow Up  Pharmacy Consult for TNA Indication: prolonged ileus  Allergies  Allergen Reactions  . Erythromycin Base Other (See Comments)    stomach pains   . Lidocaine   . Codeine Itching and Rash  . Penicillins Palpitations    Patient Measurements: Height: 5\' 3"  (160 cm) Weight: 110 lb 0.2 oz (49.9 kg) IBW/kg (Calculated) : 52.4  Vital Signs: Temp: 98.1 F (36.7 C) (05/22 0602) Temp src: Oral (05/22 0602) BP: 115/73 mmHg (05/22 0602) Pulse Rate: 102 (05/22 0602) Intake/Output from previous day: 05/21 0701 - 05/22 0700 In: 1470.3 [I.V.:480.3; IV Piggyback:150; TPN:840] Out: 9325 [Urine:5150; Emesis/NG output:2800; OZHYQ:6578] Intake/Output from this shift:    Labs:  Recent Labs  07/16/13 0445 07/17/13 0445 07/18/13 0515  WBC 15.0* 15.3* 11.7*  HGB 10.0* 10.1* 9.3*  HCT 29.5* 30.1* 27.7*  PLT 391 391 379     Recent Labs  07/15/13 0845 07/16/13 0445 07/17/13 0445 07/18/13 0515  NA  --  137 140 138  K  --  3.6* 3.8 3.9  CL  --  102 101 102  CO2  --  27 30 28   GLUCOSE  --  167* 148* 130*  BUN  --  8 11 10   CREATININE  --  0.39* 0.39* 0.37*  CALCIUM  --  7.8* 8.3* 8.2*  MG  --  1.9 2.0 1.9  PHOS  --  2.6 2.9 3.6  PROT  --  4.4* 4.8*  --   ALBUMIN  --  1.6* 1.6*  --   AST  --  19 22  --   ALT  --  13 13  --   ALKPHOS  --  60 71  --   BILITOT  --  0.3 0.2*  --   PREALBUMIN 5.6* 5.4*  --   --   TRIG  --  128  --   --   Corrected Calcium = 10.2 Estimated Creatinine Clearance: 56.7 ml/min (by C-G formula based on Cr of 0.37).    Recent Labs  07/17/13 1745 07/17/13 2305 07/18/13 0619  GLUCAP 136* 127* 136*   Nutritional Goals:  RD Recommendations: Kcal 1500-1700, Protein 60-75g, Fluid 1.5-1.7L/day Clinimix-E 5/15 at a goal rate of 67ml/hr + 20% fat emulsion at 10 ml/hr to provide: 1502 kCal/day, 72 g/day protein  Current nutrition:  NPO - NG placed 5/21 Clinimix-E 5/15 at 60 mL/hr Fat Emulsion 20% at 10  mL/hr IVF:NS + KCl 20 mEq/L @ 20 ml/min   CBGs & Insulin requirements past 24 hours:  CBGs 127-136 Novolog 7 units sliding scale coverage (on sensitive scale q6h)  Assessment:  63 yo F s/p ex lap, colostomy, sigmoid colectomy, Hartmann's procedure on 5/13. Patient was unable to tolerate clear liquids and had not eaten since 5/10. TNA was started 5/19 with pharmacy to assist. Patient at risk for refeeding syndrome due to length of time without eating. Did not tolerate trial of clear liquids.  5/22 TPN day# 3:  Electrolytes: WNL Renal function: SCr and BUN below ULN, stable. Hepatic function: all LFTs below ULN. Pre-Albumin: baseline 5.6 (5/19), 5.4 (5/20) TG: 128 (5/20) Glucose: CBGs < 180. No hx of DM.   Plan:  1) Continue Clinimix E 5/15 at 60 ml/hr (goal rate)  TPN to contain standard multivitamins and trace elements daily. 2) Continue IV Lipids 20% at 10 ml/hr  3) Continue Novolog SSI sensitive scale q6h coverage 4) Bmet scheduled for tomorrow 5) TNA labs on Monday and Thursdays as  per protocol   Romeo Rabon, PharmD, pager [phone]. 07/18/2013,8:24 AM.

## 2013-07-18 NOTE — Progress Notes (Signed)
PT Cancellation Note  ___Treatment cancelled today due to medical issues with patient which prohibited therapy  _X_ Treatment cancelled today due to patient receiving procedure    ___ Treatment cancelled today due to patient's refusal to participate   ___ Treatment cancelled today due to   Rica Koyanagi  PTA [facility]  Acute  Rehab Pager      [phone]

## 2013-07-18 NOTE — Progress Notes (Signed)
Ok after drain placement 65 ml from transgluteal drain, serosanguinous fluid. 15 ml frankly purulent fluid from right drain.   She feels OK, still a bit sleepy from sedation, she doesn't complain much.   VSS

## 2013-07-18 NOTE — Progress Notes (Signed)
9 Days Post-Op  Subjective: She feels tired and worn out, didn't sleep well last PM.  Still rather bloated, abdomen is still distended.  Lots of drainage from NG, ostomy and urine output.   No SOB.  Objective: Vital signs in last 24 hours: Temp:  [97.6 F (36.4 C)-98.1 F (36.7 C)] 98.1 F (36.7 C) (05/22 0602) Pulse Rate:  [82-112] 102 (05/22 0602) Resp:  [18] 18 (05/22 0602) BP: (115-126)/(73-80) 115/73 mmHg (05/22 0602) SpO2:  [93 %-94 %] 93 % (05/22 0602) Last BM Date: 07/18/13  2300 from NG 1375 from her ostomy 5150 urine, she is negative balance BP is in the 115-120 range Labs stable WBC is down CT shows ascites pleural effusions bilaterally, 8.3 x 7.7 x 9.2 cm rectovaginal fluid collection, and 5.6 x 3.3 x 4.1 cm RLQ anterior collection. She remains somewhat tachycardic.  Intake/Output from previous day: 05/21 0701 - 05/22 0700 In: 1470.3 [I.V.:480.3; IV Piggyback:150; TPN:840] Out: 8825 [Urine:5150; Emesis/NG output:2300; LFYBO:1751] Intake/Output this shift:    General appearance: alert, cooperative, no distress and tired Resp: clear to auscultation bilaterally and BS down in bases, but otherwise clear.  No complaints of SOB GI: soft, distended +BS, clear green fluid in ostomy bag, and the NG Extremities: mild edema.  Lab Results:   Recent Labs  07/17/13 0445 07/18/13 0515  WBC 15.3* 11.7*  HGB 10.1* 9.3*  HCT 30.1* 27.7*  PLT 391 379    BMET  Recent Labs  07/17/13 0445 07/18/13 0515  NA 140 138  K 3.8 3.9  CL 101 102  CO2 30 28  GLUCOSE 148* 130*  BUN 11 10  CREATININE 0.39* 0.37*  CALCIUM 8.3* 8.2*   PT/INR No results found for this basename: LABPROT, INR,  in the last 72 hours   Recent Labs Lab 07/16/13 0445 07/17/13 0445  AST 19 22  ALT 13 13  ALKPHOS 60 71  BILITOT 0.3 0.2*  PROT 4.4* 4.8*  ALBUMIN 1.6* 1.6*     Lipase     Component Value Date/Time   LIPASE 11 07/09/2013 1415     Studies/Results: Ct Abdomen Pelvis W  Contrast  07/17/2013   CLINICAL DATA:  Abdominal pain and nausea. Status post exploratory laparoscopy with sigmoid colectomy and Hartman pouch procedure 07/09/2013.  EXAM: CT ABDOMEN AND PELVIS WITH CONTRAST  TECHNIQUE: Multidetector CT imaging of the abdomen and pelvis was performed using the standard protocol following bolus administration of intravenous contrast.  CONTRAST:  185mL OMNIPAQUE IOHEXOL 300 MG/ML  SOLN  COMPARISON:  Radiographs 07/09/2013.  CT 07/06/2013.  FINDINGS: There are new moderate size dependent pleural effusions bilaterally with adjacent lower lobe atelectasis. There is trace pericardial fluid. The heart size is normal.  Possible small subcapsular hemangioma inferiorly in the right hepatic lobe on image 26 is stable. The liver, gallbladder, spleen, pancreas, adrenal glands and kidneys otherwise appear normal. There is no hydronephrosis or evidence of ureteral injury.  Interval sigmoid colon resection. The rectum appears decompressed. Descending colostomy appears normal. There is moderate diffuse small bowel distension. Nasogastric tube is within the mid stomach which is partially decompressed. No focal transition point is demonstrated. There is generalized mesenteric and subcutaneous edema with a moderate amount of ascites. Within the pelvis, there are several more complex fluid collections with peripheral peritoneal enhancement. One in the rectovaginal pouch measures up to 8.3 x 7.7 x 9.2 cm. There is a smaller component in the right lower quadrant more anteriorly which measures 5.6 x 3.3 x 4.1 cm.  These are suspicious for developing abscesses. No extravasated enteric contrast identified. There is a small amount of fluid within the anterior abdominal wall at the midline incision.  There is no evidence of large vessel occlusion. The bladder and uterus appear normal.  IMPRESSION: 1. Ascites with prominent peritoneal enhancement in the pelvis suspicious for postoperative pelvic abscesses. The  dominant pelvic fluid collection may be amenable to transgluteal drainage. 2. Diffuse small bowel distention, most likely representing postoperative ileus. Mechanical small bowel obstruction less likely status post recent partial colectomy and descending colostomy. 3. Moderate-sized bilateral pleural effusions with bibasilar atelectasis. 4. Generalized subcutaneous and mesenteric edema consistent with anasarca.   Electronically Signed   By: Camie Patience M.D.   On: 07/17/2013 16:33    Medications: . antiseptic oral rinse  15 mL Mouth Rinse q12n4p  . chlorhexidine  15 mL Mouth Rinse BID  . enoxaparin (LOVENOX) injection  40 mg Subcutaneous Q24H  . ertapenem  1 g Intravenous Q24H  . famotidine (PEPCID) IV  20 mg Intravenous Q12H  . insulin aspart  0-9 Units Subcutaneous 4 times per day  . lip balm  1 application Topical BID  . metoprolol  2.5 mg Intravenous 4 times per day    Assessment/Plan 1. Stercoral perforation, s/p EXPLORATORY LAPAROTOMY, COLOSTOMY, SIGMOID COLECTOMY, HARTMANS PROCEDURE, 07/09/2013, Edward Jolly, MD.  2.  Post op intraabdominal fluid collections, ileus, ascites, bilateral pleural effusions. 3.  Post op  ileus/peritonitis                                                                  4.  POST OP leukocytosis, day 8 today of Invanz                5. Hx of MVP and palpatations 6. Hypertension 7. Depression  8. DVT prophylaxis with Lovenox   Plan:  Ask IR to see and evaluate for drain placement of intraabdominal abscesses.  I am going to give her some extra fluid, and if she has any pressure issues we may ask Medicine to help with fluids.   LOS: 9 days    Kristen Reyes 07/18/2013

## 2013-07-18 NOTE — Consult Note (Signed)
Reason for Consult:Abdominal and pelvic abscesses, request drainage Consulting Radiologist: Dr. Jacqulynn Cadet Referring Physician: Dr. Leighton Ruff   HPI: Kristen Reyes is an 63 y.o. female who underwent ex lap, Sigmoid colectomy and Hartman's procedure for stercoral perforation on 5/13. She has now developed post op ileus and CT finds evidence of abdominal and pelvic fluid collections concerning for abscess formation. IR is asked to place percutaneous drains if possible. Imaging reviewed by Dr. Laurence Ferrari PMHx, chart, meds, labs reviewed.  Past Medical History:  Past Medical History  Diagnosis Date  . Hypertension   . Depression     Surgical History:  Past Surgical History  Procedure Laterality Date  . Oophorectomy    . Cardiac catheterization    . Laparotomy N/A 07/09/2013    Procedure: EXPLORATORY LAPAROTOMY, COLOSTOMY, SIGMOID COLECTOMY, HARTMANS PROCEDURE;  Surgeon: Edward Jolly, MD;  Location: [facility];  Service: General;  Laterality: N/A;    Family History: History reviewed. No pertinent family history.  Social History:  reports that she has quit smoking. She does not have any smokeless tobacco history on file. She reports that she drinks alcohol. She reports that she does not use illicit drugs.  Allergies:  Allergies  Allergen Reactions  . Erythromycin Base Other (See Comments)    stomach pains   . Lidocaine   . Codeine Itching and Rash  . Penicillins Palpitations    Medications: Current facility-administered medications:0.9 % NaCl with KCl 20 mEq/ L  infusion, , Intravenous, Continuous, Julio Sicks, RPH, Last Rate: 20 mL/hr at 07/17/13 2130;  acetaminophen (TYLENOL) suppository 650 mg, 650 mg, Rectal, Q6H PRN, Adin Hector, MD;  alum & mag hydroxide-simeth (MAALOX/MYLANTA) 200-200-20 MG/5ML suspension 30 mL, 30 mL, Oral, Q6H PRN, Adin Hector, MD antiseptic oral rinse (BIOTENE) solution 15 mL, 15 mL, Mouth Rinse, q12n4p, Adin Hector, MD, 15 mL  at 07/17/13 1225;  bisacodyl (DULCOLAX) suppository 10 mg, 10 mg, Rectal, Q12H PRN, Adin Hector, MD;  chlorhexidine (PERIDEX) 0.12 % solution 15 mL, 15 mL, Mouth Rinse, BID, Adin Hector, MD, 15 mL at 07/17/13 0841;  diphenhydrAMINE (BENADRYL) injection 12.5-25 mg, 12.5-25 mg, Intravenous, Q6H PRN, Earnstine Regal, PA-C enoxaparin (LOVENOX) injection 40 mg, 40 mg, Subcutaneous, Q24H, Earnstine Regal, PA-C, 40 mg at 07/17/13 0841;  ertapenem [facility]) 1 g in sodium chloride 0.9 % 50 mL IVPB, 1 g, Intravenous, Q24H, Earnstine Regal, PA-C, 1 g at 07/17/13 1624;  famotidine (PEPCID) IVPB 20 mg, 20 mg, Intravenous, Q12H, Earnstine Regal, PA-C, 20 mg at 07/17/13 2129 fat emulsion 20 % infusion 250 mL, 250 mL, Intravenous, Continuous TPN, Randall K Absher, RPH, Last Rate: 10 mL/hr at 07/17/13 1720, 250 mL at 07/17/13 1720;  fat emulsion 20 % infusion 250 mL, 250 mL, Intravenous, Continuous TPN, Earnstine Regal, PA-C;  fentaNYL (SUBLIMAZE) injection 12.5 mcg, 12.5 mcg, Intravenous, Q2H PRN, Earnstine Regal, PA-C, 12.5 mcg at 07/17/13 1620 insulin aspart (novoLOG) injection 0-9 Units, 0-9 Units, Subcutaneous, 4 times per day, Julio Sicks, [facility], 1 Units at 07/17/13 1829;  lip balm (CARMEX) ointment 1 application, 1 application, Topical, BID, Adin Hector, MD, 1 application at 92/11/94 [phone];  magic mouthwash, 15 mL, Oral, QID PRN, Adin Hector, MD;  menthol-cetylpyridinium (CEPACOL) lozenge 3 mg, 1 lozenge, Oral, PRN, Earnstine Regal, PA-C metoCLOPramide (REGLAN) injection 5-10 mg, 5-10 mg, Intravenous, Q6H PRN, Adin Hector, MD, 10 mg at 07/17/13 0151;  metoprolol (LOPRESSOR) injection 2.5 mg, 2.5 mg, Intravenous, 4 times per day, Earnstine Regal,  PA-C, 2.5 mg at 07/18/13 0552;  ondansetron (ZOFRAN) 8 mg/NS 50 ml IVPB, 8 mg, Intravenous, Q6H PRN, Adin Hector, MD;  ondansetron [facility]) injection 4 mg, 4 mg, Intravenous, Q6H PRN, Adin Hector, MD, 4 mg at 07/16/13 1223 phenol (CHLORASEPTIC)  mouth spray 1 spray, 1 spray, Mouth/Throat, PRN, Earnstine Regal, PA-C;  promethazine (PHENERGAN) injection 6.25-25 mg, 6.25-25 mg, Intravenous, Q6H PRN, Adin Hector, MD, 25 mg at 07/17/13 0324;  sodium chloride 0.9 % bolus 1,000 mL, 1,000 mL, Intravenous, Once, Earnstine Regal, PA-C;  sodium chloride 0.9 % injection 10-40 mL, 10-40 mL, Intracatheter, PRN, Earnstine Regal, PA-C, 10 mL at 07/17/13 0435 TPN (CLINIMIX-E) Adult, , Intravenous, Continuous TPN, Randall K Absher, RPH, Last Rate: 60 mL/hr at 07/17/13 1720;  TPN (CLINIMIX-E) Adult, , Intravenous, Continuous TPN, Earnstine Regal, PA-C  ROS: See HPI for pertinent findings, otherwise complete 10 system review negative.  Physical Exam: Blood pressure 115/73, pulse 102, temperature 98.1 F (36.7 C), temperature source Oral, resp. rate 18, height _0  (1.6 m), weight 110 lb 0.2 oz (49.9 kg), SpO2 93.00%. ENT: NGT in right nare, unremarkable airway Lungs: CTA without w/r/r Heart: Regular Abdomen: soft but distended. Midline incision dressing c/d/i, (L)abd ostomy vible with some soft stool output.    Labs: CBC  Recent Labs  07/17/13 0445 07/18/13 0515  WBC 15.3* 11.7*  HGB 10.1* 9.3*  HCT 30.1* 27.7*  PLT 391 379   MET  Recent Labs  07/17/13 0445 07/18/13 0515  NA 140 138  K 3.8 3.9  CL 101 102  CO2 30 28  GLUCOSE 148* 130*  BUN 11 10  CREATININE 0.39* 0.37*  CALCIUM 8.3* 8.2*    Recent Labs  07/17/13 0445  PROT 4.8*  ALBUMIN 1.6*  AST 22  ALT 13  ALKPHOS 71  BILITOT 0.2*     Ct Abdomen Pelvis W Contrast  07/17/2013   CLINICAL DATA:  Abdominal pain and nausea. Status post exploratory laparoscopy with sigmoid colectomy and Hartman pouch procedure 07/09/2013.  EXAM: CT ABDOMEN AND PELVIS WITH CONTRAST  TECHNIQUE: Multidetector CT imaging of the abdomen and pelvis was performed using the standard protocol following bolus administration of intravenous contrast.  CONTRAST:  160m OMNIPAQUE IOHEXOL 300 MG/ML   SOLN  COMPARISON:  Radiographs 07/09/2013.  CT 07/06/2013.  FINDINGS: There are new moderate size dependent pleural effusions bilaterally with adjacent lower lobe atelectasis. There is trace pericardial fluid. The heart size is normal.  Possible small subcapsular hemangioma inferiorly in the right hepatic lobe on image 26 is stable. The liver, gallbladder, spleen, pancreas, adrenal glands and kidneys otherwise appear normal. There is no hydronephrosis or evidence of ureteral injury.  Interval sigmoid colon resection. The rectum appears decompressed. Descending colostomy appears normal. There is moderate diffuse small bowel distension. Nasogastric tube is within the mid stomach which is partially decompressed. No focal transition point is demonstrated. There is generalized mesenteric and subcutaneous edema with a moderate amount of ascites. Within the pelvis, there are several more complex fluid collections with peripheral peritoneal enhancement. One in the rectovaginal pouch measures up to 8.3 x 7.7 x 9.2 cm. There is a smaller component in the right lower quadrant more anteriorly which measures 5.6 x 3.3 x 4.1 cm. These are suspicious for developing abscesses. No extravasated enteric contrast identified. There is a small amount of fluid within the anterior abdominal wall at the midline incision.  There is no evidence of large vessel occlusion. The bladder and uterus appear normal.  IMPRESSION:  1. Ascites with prominent peritoneal enhancement in the pelvis suspicious for postoperative pelvic abscesses. The dominant pelvic fluid collection may be amenable to transgluteal drainage. 2. Diffuse small bowel distention, most likely representing postoperative ileus. Mechanical small bowel obstruction less likely status post recent partial colectomy and descending colostomy. 3. Moderate-sized bilateral pleural effusions with bibasilar atelectasis. 4. Generalized subcutaneous and mesenteric edema consistent with anasarca.    Electronically Signed   By: Camie Patience M.D.   On: 07/17/2013 16:33    Assessment/Plan: Abdominal and pelvic abscesses post major abdominal surgery Dr. Laurence Ferrari has reviewed imaging and feels pt to benefit from at least 2 perc drains. Discussed procedure, risks, complications, use of sedation with pt. Discussed expectations post drain placement. Labs reviewed Consent signed in chart  Ascencion Dike PA-C 07/18/2013, 9:16 AM

## 2013-07-19 LAB — CBC
HCT: 28.2 % — ABNORMAL LOW (ref 36.0–46.0)
Hemoglobin: 9.4 g/dL — ABNORMAL LOW (ref 12.0–15.0)
MCH: 29.1 pg (ref 26.0–34.0)
MCHC: 33.3 g/dL (ref 30.0–36.0)
MCV: 87.3 fL (ref 78.0–100.0)
PLATELETS: 392 10*3/uL (ref 150–400)
RBC: 3.23 MIL/uL — AB (ref 3.87–5.11)
RDW: 13.4 % (ref 11.5–15.5)
WBC: 10.8 10*3/uL — AB (ref 4.0–10.5)

## 2013-07-19 LAB — BASIC METABOLIC PANEL
BUN: 12 mg/dL (ref 6–23)
CHLORIDE: 101 meq/L (ref 96–112)
CO2: 28 meq/L (ref 19–32)
Calcium: 8.4 mg/dL (ref 8.4–10.5)
Creatinine, Ser: 0.37 mg/dL — ABNORMAL LOW (ref 0.50–1.10)
GFR calc non Af Amer: >90 mL/min (ref >90)
Glucose, Bld: 125 mg/dL — ABNORMAL HIGH (ref 70–99)
Potassium: 3.8 mEq/L (ref 3.7–5.3)
SODIUM: 138 meq/L (ref 137–147)

## 2013-07-19 LAB — GLUCOSE, CAPILLARY
GLUCOSE-CAPILLARY: 124 mg/dL — AB (ref 70–99)
GLUCOSE-CAPILLARY: 127 mg/dL — AB (ref 70–99)
GLUCOSE-CAPILLARY: 128 mg/dL — AB (ref 70–99)
Glucose-Capillary: 139 mg/dL — ABNORMAL HIGH (ref 70–99)

## 2013-07-19 MED ORDER — FAT EMULSION 20 % IV EMUL
250.0000 mL | INTRAVENOUS | Status: AC
  Administered 2013-07-19: 250 mL via INTRAVENOUS
  Filled 2013-07-19: qty 250

## 2013-07-19 MED ORDER — CLINIMIX E/DEXTROSE (5/15) 5 % IV SOLN
INTRAVENOUS | Status: AC
  Administered 2013-07-19: 17:00:00 via INTRAVENOUS
  Filled 2013-07-19: qty 2000

## 2013-07-19 NOTE — Progress Notes (Signed)
Subjective: Pt resting, feels a little better than yesterday. Tol CT guided drain placement procedure well yesterday.  Objective: Physical Exam: BP 122/81  Pulse 109  Temp(Src) 98.5 F (36.9 C) (Oral)  Resp 16  Ht 5\' 3"  (1.6 m)  Wt 110 lb 0.2 oz (49.9 kg)  BMI 19.49 kg/m2  SpO2 96% RLQ drain intact, site clean, mildly tender. Output mix of serosanguinous with some stranding and debris. Flushes easily (R)transgluteak drain intact, site clean, mildly tender. Output thin serosanguinous    Labs: CBC  Recent Labs  07/18/13 0515 07/19/13 0440  WBC 11.7* 10.8*  HGB 9.3* 9.4*  HCT 27.7* 28.2*  PLT 379 392   BMET  Recent Labs  07/18/13 0515 07/19/13 0440  NA 138 138  K 3.9 3.8  CL 102 101  CO2 28 28  GLUCOSE 130* 125*  BUN 10 12  CREATININE 0.37* 0.37*  CALCIUM 8.2* 8.4   LFT  Recent Labs  07/17/13 0445  PROT 4.8*  ALBUMIN 1.6*  AST 22  ALT 13  ALKPHOS 71  BILITOT 0.2*   PT/INR No results found for this basename: LABPROT, INR,  in the last 72 hours   Studies/Results: Ct Abdomen Pelvis W Contrast  07/17/2013   CLINICAL DATA:  Abdominal pain and nausea. Status post exploratory laparoscopy with sigmoid colectomy and Hartman pouch procedure 07/09/2013.  EXAM: CT ABDOMEN AND PELVIS WITH CONTRAST  TECHNIQUE: Multidetector CT imaging of the abdomen and pelvis was performed using the standard protocol following bolus administration of intravenous contrast.  CONTRAST:  14mL OMNIPAQUE IOHEXOL 300 MG/ML  SOLN  COMPARISON:  Radiographs 07/09/2013.  CT 07/06/2013.  FINDINGS: There are new moderate size dependent pleural effusions bilaterally with adjacent lower lobe atelectasis. There is trace pericardial fluid. The heart size is normal.  Possible small subcapsular hemangioma inferiorly in the right hepatic lobe on image 26 is stable. The liver, gallbladder, spleen, pancreas, adrenal glands and kidneys otherwise appear normal. There is no hydronephrosis or evidence of  ureteral injury.  Interval sigmoid colon resection. The rectum appears decompressed. Descending colostomy appears normal. There is moderate diffuse small bowel distension. Nasogastric tube is within the mid stomach which is partially decompressed. No focal transition point is demonstrated. There is generalized mesenteric and subcutaneous edema with a moderate amount of ascites. Within the pelvis, there are several more complex fluid collections with peripheral peritoneal enhancement. One in the rectovaginal pouch measures up to 8.3 x 7.7 x 9.2 cm. There is a smaller component in the right lower quadrant more anteriorly which measures 5.6 x 3.3 x 4.1 cm. These are suspicious for developing abscesses. No extravasated enteric contrast identified. There is a small amount of fluid within the anterior abdominal wall at the midline incision.  There is no evidence of large vessel occlusion. The bladder and uterus appear normal.  IMPRESSION: 1. Ascites with prominent peritoneal enhancement in the pelvis suspicious for postoperative pelvic abscesses. The dominant pelvic fluid collection may be amenable to transgluteal drainage. 2. Diffuse small bowel distention, most likely representing postoperative ileus. Mechanical small bowel obstruction less likely status post recent partial colectomy and descending colostomy. 3. Moderate-sized bilateral pleural effusions with bibasilar atelectasis. 4. Generalized subcutaneous and mesenteric edema consistent with anasarca.   Electronically Signed   By: Camie Patience M.D.   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 Followup CT scanning for postoperative ileus demonstrates peripherally enhancing fluid collections in the right medial inguinal recess and a  deep pelvic cul-de-sac concerning for abscesses. CT-guided drain placement is warranted.  EXAM: CT IMAGE GUIDED DRAINAGE BY PERCUTANEOUS CATHETER; CT IMAGE GUIDED DRAINAGE PERCUT CATH PERITONEAL RETROPERIT  Date: 07/18/2013  PROCEDURE: 1. CT-guided drain placement into deep cul-de-sac collection via right trans gluteal approach 2. CT-guided drain placement into right medial inguinal recess collection via right lower quadrant approach Interventional Radiologist:  Criselda Peaches, MD  ANESTHESIA/SEDATION: Moderate (conscious) sedation was used. Two mg Versed, 100 mcg Fentanyl were administered intravenously. The patient's vital signs were monitored continuously by radiology nursing throughout the procedure.  Sedation Time: 45 minutes  TECHNIQUE: Informed consent was obtained from the patient following explanation of the procedure, risks, benefits and alternatives. The patient understands, agrees and consents for the procedure. All questions were addressed. A time out was performed.  The patient was first position in the left lateral decubitus position. A planning axial CT scan was performed. The deep pelvic cul-de-sac collection was identified. A suitable skin entry site was selected and marked. Local anesthesia was attained by 1% lidocaine. A small dermatotomy was made. Under real-time fluoroscopic guidance, an 18 gauge trocar needle was carefully advanced into the fluid collection. A wire was then coiled within the fluid and the tract serially dilated to 12 [place]. A Cook 12 [place] all-purpose drainage catheter modified with several additional sideholes was then advanced over the wire and formed within the fluid collection. A total of approximately 75 mL of turbid serosanguineous peritoneal fluid was aspirated. The catheter was flushed and connected to JP bulb suction. The catheter was then secured to the skin using 0 Prolene suture and an adhesive fixation device. A sterile bandage was placed.  The patient was  then repositioned into the supine position. A second planning axial CT scan was performed. The right lower quadrant fluid and gas collection was localized and a suitable skin entry site selected and marked. Local anesthesia was again attained by infiltration with 1% lidocaine. Under intermittent CT fluoroscopic guidance, an 18 gauge trocar needle was carefully advanced and the fluid collection. A wire was coiled within the collection. The tract was dilated to 10 [place] and a [place] all-purpose drainage catheter was performed. Approximately 20 mL of frankly purulent fluid was aspirated. The cavity was lavaged with 20 cc of saline. The catheter was connected to JP bulb suction and secured to the skin with 0 Prolene suture and an adhesive fixation device.  There was no immediate complications; the patient tolerated the procedure well.  IMPRESSION: 1. Placement of a 12 French drain into the pelvic cul-de-sac fluid collection with aspiration of approximately 75 mL of turbid serosanguineous peritoneal fluid. A sample was sent for culture. 2. Placement of a 10 French drain in the right lower quadrant medial inguinal recess collection with aspiration of 15 mL of frankly purulent material. A sample was sent for culture. Drains were connected to JP bulb suction. Recommend repeat CT scan with oral and IV contrast prior to drain removal.  Signed,  Criselda Peaches, MD  [facility]  [facility] Radiology   Electronically Signed   By: Jacqulynn Cadet M.D.   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 Followup CT scanning for postoperative ileus demonstrates peripherally enhancing fluid collections in the right medial inguinal recess and  a deep pelvic cul-de-sac concerning for abscesses. CT-guided drain placement is warranted.  EXAM: CT IMAGE GUIDED DRAINAGE BY PERCUTANEOUS CATHETER; CT IMAGE GUIDED DRAINAGE PERCUT CATH PERITONEAL RETROPERIT  Date: 07/18/2013  PROCEDURE: 1. CT-guided drain placement into deep cul-de-sac collection via right trans gluteal approach 2. CT-guided drain placement into right medial inguinal recess collection via right lower quadrant approach Interventional Radiologist:  Criselda Peaches, MD  ANESTHESIA/SEDATION: Moderate (conscious) sedation was used. Two mg Versed, 100 mcg Fentanyl were administered intravenously. The patient's vital signs were monitored continuously by radiology nursing throughout the procedure.  Sedation Time: 45 minutes  TECHNIQUE: Informed consent was obtained from the patient following explanation of the procedure, risks, benefits and alternatives. The patient understands, agrees and consents for the procedure. All questions were addressed. A time out was performed.  The patient was first position in the left lateral decubitus position. A planning axial CT scan was performed. The deep pelvic cul-de-sac collection was identified. A suitable skin entry site was selected and marked. Local anesthesia was attained by 1% lidocaine. A small dermatotomy was made. Under real-time fluoroscopic guidance, an 18 gauge trocar needle was carefully advanced into the fluid collection. A wire was then coiled within the fluid and the tract serially dilated to 12 [place]. A Cook 12 [place] all-purpose drainage catheter modified with several additional sideholes was then advanced over the wire and formed within the fluid collection. A total of approximately 75 mL of turbid serosanguineous peritoneal fluid was aspirated. The catheter was flushed and connected to JP bulb suction. The catheter was then secured to the skin using 0 Prolene suture and an adhesive fixation device. A sterile bandage was placed.  The patient was  then repositioned into the supine position. A second planning axial CT scan was performed. The right lower quadrant fluid and gas collection was localized and a suitable skin entry site selected and marked. Local anesthesia was again attained by infiltration with 1% lidocaine. Under intermittent CT fluoroscopic guidance, an 18 gauge trocar needle was carefully advanced and the fluid collection. A wire was coiled within the collection. The tract was dilated to 10 [place] and a [place] all-purpose drainage catheter was performed. Approximately 20 mL of frankly purulent fluid was aspirated. The cavity was lavaged with 20 cc of saline. The catheter was connected to JP bulb suction and secured to the skin with 0 Prolene suture and an adhesive fixation device.  There was no immediate complications; the patient tolerated the procedure well.  IMPRESSION: 1. Placement of a 12 French drain into the pelvic cul-de-sac fluid collection with aspiration of approximately 75 mL of turbid serosanguineous peritoneal fluid. A sample was sent for culture. 2. Placement of a 10 French drain in the right lower quadrant medial inguinal recess collection with aspiration of 15 mL of frankly purulent material. A sample was sent for culture. Drains were connected to JP bulb suction. Recommend repeat CT scan with oral and IV contrast prior to drain removal.  Signed,  Criselda Peaches, MD  [facility]  [facility] Radiology   Electronically Signed   By: Jacqulynn Cadet M.D.   On: 07/18/2013 13:27    Assessment/Plan: Abdominal and pelvic abscesses post major abdominal surgery S/p CT guided perc drains on 5/22 WBC down some Cx pending Other plans per [facility]   LOS: 10  days    Ascencion Dike PA-C 07/19/2013 11:12 AM

## 2013-07-19 NOTE — Progress Notes (Signed)
10 Days Post-Op  Subjective: Complains of soreness but seems to be ok  Objective: Vital signs in last 24 hours: Temp:  [97.7 F (36.5 C)-98.5 F (36.9 C)] 98.5 F (36.9 C) (05/23 0500) Pulse Rate:  [101-124] 109 (05/23 0500) Resp:  [16-23] 16 (05/23 0500) BP: (99-134)/(64-81) 122/81 mmHg (05/23 0500) SpO2:  [93 %-100 %] 96 % (05/23 0500) Last BM Date: 07/18/13  Intake/Output from previous day: 05/22 0701 - 05/23 0700 In: 69 [IV Piggyback:50] Out: 4155 [Urine:3200; Emesis/NG output:575; Drains:80; Stool:300] Intake/Output this shift: Total I/O In: -  Out: 400 [Urine:300; Emesis/NG output:100]  Resp: clear to auscultation bilaterally Cardio: regular rate and rhythm GI: soft, appropriately tender. open wound clean. ostomy pink with small amount of fluid in bag  Lab Results:   Recent Labs  07/18/13 0515 07/19/13 0440  WBC 11.7* 10.8*  HGB 9.3* 9.4*  HCT 27.7* 28.2*  PLT 379 392   BMET  Recent Labs  07/18/13 0515 07/19/13 0440  NA 138 138  K 3.9 3.8  CL 102 101  CO2 28 28  GLUCOSE 130* 125*  BUN 10 12  CREATININE 0.37* 0.37*  CALCIUM 8.2* 8.4   PT/INR No results found for this basename: LABPROT, INR,  in the last 72 hours ABG No results found for this basename: PHART, PCO2, PO2, HCO3,  in the last 72 hours  Studies/Results: Ct Abdomen Pelvis W Contrast  07/17/2013   CLINICAL DATA:  Abdominal pain and nausea. Status post exploratory laparoscopy with sigmoid colectomy and Hartman pouch procedure 07/09/2013.  EXAM: CT ABDOMEN AND PELVIS WITH CONTRAST  TECHNIQUE: Multidetector CT imaging of the abdomen and pelvis was performed using the standard protocol following bolus administration of intravenous contrast.  CONTRAST:  148mL OMNIPAQUE IOHEXOL 300 MG/ML  SOLN  COMPARISON:  Radiographs 07/09/2013.  CT 07/06/2013.  FINDINGS: There are new moderate size dependent pleural effusions bilaterally with adjacent lower lobe atelectasis. There is trace pericardial fluid.  The heart size is normal.  Possible small subcapsular hemangioma inferiorly in the right hepatic lobe on image 26 is stable. The liver, gallbladder, spleen, pancreas, adrenal glands and kidneys otherwise appear normal. There is no hydronephrosis or evidence of ureteral injury.  Interval sigmoid colon resection. The rectum appears decompressed. Descending colostomy appears normal. There is moderate diffuse small bowel distension. Nasogastric tube is within the mid stomach which is partially decompressed. No focal transition point is demonstrated. There is generalized mesenteric and subcutaneous edema with a moderate amount of ascites. Within the pelvis, there are several more complex fluid collections with peripheral peritoneal enhancement. One in the rectovaginal pouch measures up to 8.3 x 7.7 x 9.2 cm. There is a smaller component in the right lower quadrant more anteriorly which measures 5.6 x 3.3 x 4.1 cm. These are suspicious for developing abscesses. No extravasated enteric contrast identified. There is a small amount of fluid within the anterior abdominal wall at the midline incision.  There is no evidence of large vessel occlusion. The bladder and uterus appear normal.  IMPRESSION: 1. Ascites with prominent peritoneal enhancement in the pelvis suspicious for postoperative pelvic abscesses. The dominant pelvic fluid collection may be amenable to transgluteal drainage. 2. Diffuse small bowel distention, most likely representing postoperative ileus. Mechanical small bowel obstruction less likely status post recent partial colectomy and descending colostomy. 3. Moderate-sized bilateral pleural effusions with bibasilar atelectasis. 4. Generalized subcutaneous and mesenteric edema consistent with anasarca.   Electronically Signed   By: Camie Patience M.D.   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 Followup CT scanning for postoperative ileus demonstrates peripherally enhancing fluid collections in the right medial inguinal recess and a deep pelvic cul-de-sac concerning for abscesses. CT-guided drain placement is warranted.  EXAM: CT IMAGE GUIDED DRAINAGE BY PERCUTANEOUS CATHETER; CT IMAGE GUIDED DRAINAGE PERCUT CATH PERITONEAL RETROPERIT  Date: 07/18/2013  PROCEDURE: 1. CT-guided drain placement into deep cul-de-sac collection via right trans gluteal approach 2. CT-guided drain placement into right medial inguinal recess collection via right lower quadrant approach Interventional Radiologist:  Criselda Peaches, MD  ANESTHESIA/SEDATION: Moderate (conscious) sedation was used. Two mg Versed, 100 mcg Fentanyl were administered intravenously. The patient's vital signs were monitored continuously by radiology nursing throughout the procedure.  Sedation Time: 45 minutes  TECHNIQUE: Informed consent was obtained from the patient following explanation of the procedure, risks, benefits and alternatives. The patient understands, agrees and consents for the procedure. All questions were addressed. A time out was performed.  The patient was first position in the left lateral decubitus position. A planning axial CT scan was performed. The deep pelvic cul-de-sac collection was identified. A suitable skin entry site was selected and marked. Local anesthesia was attained by 1% lidocaine. A small dermatotomy was made. Under real-time fluoroscopic guidance, an 18 gauge trocar needle was carefully advanced into the fluid collection. A wire was then coiled within the fluid and the tract serially dilated to 12 [place]. A Cook 12 [place] all-purpose drainage catheter modified with several additional sideholes was then advanced over the wire and formed within the fluid collection. A total of  approximately 75 mL of turbid serosanguineous peritoneal fluid was aspirated. The catheter was flushed and connected to JP bulb suction. The catheter was then secured to the skin using 0 Prolene suture and an adhesive fixation device. A sterile bandage was placed.  The patient was then repositioned into the supine position. A second planning axial CT scan was performed. The right lower quadrant fluid and gas collection was localized and a suitable skin entry site selected and marked. Local anesthesia was again attained by infiltration with 1% lidocaine. Under intermittent CT fluoroscopic guidance, an 18 gauge trocar needle was carefully advanced and the fluid collection. A wire was coiled within the collection. The tract was dilated to 10 [place] and a [place] all-purpose drainage catheter was performed. Approximately 20 mL of frankly purulent fluid was aspirated. The cavity was lavaged with 20 cc of saline. The catheter was connected to JP bulb suction and secured to the skin with 0 Prolene suture and an adhesive fixation device.  There was no immediate complications; the patient tolerated the procedure well.  IMPRESSION: 1. Placement of a 12 French drain into the pelvic cul-de-sac fluid collection with aspiration of approximately 75 mL of turbid serosanguineous peritoneal fluid. A sample was sent for culture. 2. Placement of a 10 French drain in the right lower quadrant medial inguinal recess collection with aspiration of 15 mL of frankly purulent material. A sample was sent for culture. Drains were connected to JP bulb suction. Recommend repeat CT scan with oral and IV contrast prior to drain removal.  Signed,  Criselda Peaches, MD  [facility]  [facility]iology   Electronically Signed   By: Jacqulynn Cadet M.D.   On: 07/18/2013 13:27   Ct  Image Guided Drainage Percut Cath  Peritoneal Retroperit  07/18/2013   CLINICAL DATA:  63 year old female with stercoral  colitis and perforation requiring exploratory laparotomy, sigmoid colectomy with Hartmann's pouch and left lower quadrant colostomy placement. Followup CT scanning for postoperative ileus demonstrates peripherally enhancing fluid collections in the right medial inguinal recess and a deep pelvic cul-de-sac concerning for abscesses. CT-guided drain placement is warranted.  EXAM: CT IMAGE GUIDED DRAINAGE BY PERCUTANEOUS CATHETER; CT IMAGE GUIDED DRAINAGE PERCUT CATH PERITONEAL RETROPERIT  Date: 07/18/2013  PROCEDURE: 1. CT-guided drain placement into deep cul-de-sac collection via right trans gluteal approach 2. CT-guided drain placement into right medial inguinal recess collection via right lower quadrant approach Interventional Radiologist:  Criselda Peaches, MD  ANESTHESIA/SEDATION: Moderate (conscious) sedation was used. Two mg Versed, 100 mcg Fentanyl were administered intravenously. The patient's vital signs were monitored continuously by radiology nursing throughout the procedure.  Sedation Time: 45 minutes  TECHNIQUE: Informed consent was obtained from the patient following explanation of the procedure, risks, benefits and alternatives. The patient understands, agrees and consents for the procedure. All questions were addressed. A time out was performed.  The patient was first position in the left lateral decubitus position. A planning axial CT scan was performed. The deep pelvic cul-de-sac collection was identified. A suitable skin entry site was selected and marked. Local anesthesia was attained by 1% lidocaine. A small dermatotomy was made. Under real-time fluoroscopic guidance, an 18 gauge trocar needle was carefully advanced into the fluid collection. A wire was then coiled within the fluid and the tract serially dilated to 12 [place]. A Cook 12 [place] all-purpose drainage catheter modified with several additional sideholes was then advanced over the wire and formed within the fluid collection. A total of  approximately 75 mL of turbid serosanguineous peritoneal fluid was aspirated. The catheter was flushed and connected to JP bulb suction. The catheter was then secured to the skin using 0 Prolene suture and an adhesive fixation device. A sterile bandage was placed.  The patient was then repositioned into the supine position. A second planning axial CT scan was performed. The right lower quadrant fluid and gas collection was localized and a suitable skin entry site selected and marked. Local anesthesia was again attained by infiltration with 1% lidocaine. Under intermittent CT fluoroscopic guidance, an 18 gauge trocar needle was carefully advanced and the fluid collection. A wire was coiled within the collection. The tract was dilated to 10 [place] and a [place] all-purpose drainage catheter was performed. Approximately 20 mL of frankly purulent fluid was aspirated. The cavity was lavaged with 20 cc of saline. The catheter was connected to JP bulb suction and secured to the skin with 0 Prolene suture and an adhesive fixation device.  There was no immediate complications; the patient tolerated the procedure well.  IMPRESSION: 1. Placement of a 12 French drain into the pelvic cul-de-sac fluid collection with aspiration of approximately 75 mL of turbid serosanguineous peritoneal fluid. A sample was sent for culture. 2. Placement of a 10 French drain in the right lower quadrant medial inguinal recess collection with aspiration of 15 mL of frankly purulent material. A sample was sent for culture. Drains were connected to JP bulb suction. Recommend repeat CT scan with oral and IV contrast prior to drain removal.  Signed,  Criselda Peaches, MD  [facility]  [facility] Radiology   Electronically Signed   By: Jacqulynn Cadet M.D.   On: 07/18/2013 13:27  Anti-infectives: Anti-infectives   Start     Dose/Rate Route Frequency Ordered Stop   07/10/13 1700  ertapenem (INVANZ)  1 g in sodium chloride 0.9 % 50 mL IVPB     1 g 100 mL/hr over 30 Minutes Intravenous Every 24 hours 07/09/13 1659     07/09/13 1645  [MAR Hold]  ertapenem (INVANZ) 1 g in sodium chloride 0.9 % 50 mL IVPB     (On MAR Hold since 07/09/13 1741)   1 g 100 mL/hr over 30 Minutes Intravenous  Once 07/09/13 1626 07/09/13 1751      Assessment/Plan: s/p Procedure(s): EXPLORATORY LAPAROTOMY, COLOSTOMY, SIGMOID COLECTOMY, HARTMANS PROCEDURE (N/A) Continue ng and bowel rest for ileus Continue Invanz and perc drains OOB Continue dressing changes  LOS: 10 days    Kristen Reyes 07/19/2013

## 2013-07-19 NOTE — Progress Notes (Signed)
Post procedural orders recently released dated 07/18/2013. Vitals obtained as appropriate on  previous day post procedure. Orders followed and resumed as entered post IR.

## 2013-07-19 NOTE — Progress Notes (Signed)
PARENTERAL NUTRITION CONSULT NOTE - Follow Up  Pharmacy Consult for TNA Indication: prolonged ileus  Allergies  Allergen Reactions  . Erythromycin Base Other (See Comments)    stomach pains   . Lidocaine   . Codeine Itching and Rash  . Penicillins Palpitations    Patient Measurements: Height: 5\' 3"  (160 cm) Weight: 110 lb 0.2 oz (49.9 kg) IBW/kg (Calculated) : 52.4  Vital Signs: Temp: 98.5 F (36.9 C) (05/23 0500) Temp src: Oral (05/23 0500) BP: 122/81 mmHg (05/23 0500) Pulse Rate: 109 (05/23 0500) Intake/Output from previous day: 05/22 0701 - 05/23 0700 In: 72 [IV Piggyback:50] Out: 9470 [Urine:3200; Emesis/NG output:575; Drains:80; Stool:300] Intake/Output from this shift: Total I/O In: -  Out: 400 [Urine:300; Emesis/NG output:100]  Labs:  Recent Labs  07/17/13 0445 07/18/13 0515 07/19/13 0440  WBC 15.3* 11.7* 10.8*  HGB 10.1* 9.3* 9.4*  HCT 30.1* 27.7* 28.2*  PLT 391 379 392     Recent Labs  07/17/13 0445 07/18/13 0515 07/19/13 0440  NA 140 138 138  K 3.8 3.9 3.8  CL 101 102 101  CO2 30 28 28   GLUCOSE 148* 130* 125*  BUN 11 10 12   CREATININE 0.39* 0.37* 0.37*  CALCIUM 8.3* 8.2* 8.4  MG 2.0 1.9  --   PHOS 2.9 3.6  --   PROT 4.8*  --   --   ALBUMIN 1.6*  --   --   AST 22  --   --   ALT 13  --   --   ALKPHOS 71  --   --   BILITOT 0.2*  --   --   Corrected Calcium = 10.2 Estimated Creatinine Clearance: 56.7 ml/min (by C-G formula based on Cr of 0.37).    Recent Labs  07/18/13 1658 07/18/13 2354 07/19/13 0631  GLUCAP 124* 128* 127*   Nutritional Goals:  RD Recommendations: Kcal 1500-1700, Protein 60-75g, Fluid 1.5-1.7L/day Clinimix-E 5/15 at a goal rate of 51ml/hr + 20% fat emulsion at 10 ml/hr to provide: 1502 kCal/day, 72 g/day protein  Current nutrition:  NPO - NG placed 5/21 Clinimix-E 5/15 at 60 mL/hr Fat Emulsion 20% at 10 mL/hr IVF:NS + KCl 20 mEq/L @ 20 ml/min   CBGs & Insulin requirements past 24 hours:  CBGs  127-128 Novolog 2 units sliding scale coverage (on sensitive scale q6h)  Assessment:  63 yo F s/p ex lap, colostomy, sigmoid colectomy, Hartmann's procedure on 5/13. Patient was unable to tolerate clear liquids and had not eaten since 5/10. TNA was started 5/19 with pharmacy to assist. Patient at risk for refeeding syndrome due to length of time without eating. Did not tolerate trial of clear liquids. 5/23: NGT placed 5/22: w ~6106ml output/24h. To continue with bowel rest per CCS   TPN day# 4:  Electrolytes: WNL Renal function: SCr and BUN below ULN, stable. Hepatic function: all LFTs below ULN. Pre-Albumin: baseline 5.6 (5/19), 5.4 (5/20) TG: 128 (5/20) Glucose: CBGs < 150. No hx of DM.   Plan:  1) Continue Clinimix E 5/15 at 60 ml/hr (goal rate)  TPN to contain standard multivitamins and trace elements daily. 2) Continue IV Lipids 20% at 10 ml/hr  3) Continue Novolog SSI sensitive scale q6h coverage 4) TNA labs on Monday and Thursdays as per protocol   Thank you for the consult.  Johny Drilling, PharmD, BCPS Pager: [phone] Pharmacy: [phone] 07/19/2013 11:32 AM

## 2013-07-20 LAB — GLUCOSE, CAPILLARY
Glucose-Capillary: 127 mg/dL — ABNORMAL HIGH (ref 70–99)
Glucose-Capillary: 129 mg/dL — ABNORMAL HIGH (ref 70–99)
Glucose-Capillary: 130 mg/dL — ABNORMAL HIGH (ref 70–99)
Glucose-Capillary: 135 mg/dL — ABNORMAL HIGH (ref 70–99)
Glucose-Capillary: 136 mg/dL — ABNORMAL HIGH (ref 70–99)

## 2013-07-20 MED ORDER — FAT EMULSION 20 % IV EMUL
250.0000 mL | INTRAVENOUS | Status: AC
  Administered 2013-07-20: 250 mL via INTRAVENOUS
  Filled 2013-07-20: qty 250

## 2013-07-20 MED ORDER — TRACE MINERALS CR-CU-F-FE-I-MN-MO-SE-ZN IV SOLN
INTRAVENOUS | Status: AC
  Administered 2013-07-20: 17:00:00 via INTRAVENOUS
  Filled 2013-07-20: qty 2000

## 2013-07-20 NOTE — Progress Notes (Signed)
PARENTERAL NUTRITION CONSULT NOTE - Follow Up  Pharmacy Consult for TNA Indication: prolonged ileus  Allergies  Allergen Reactions  . Erythromycin Base Other (See Comments)    stomach pains   . Lidocaine   . Codeine Itching and Rash  . Penicillins Palpitations    Patient Measurements: Height: 5\' 3"  (160 cm) Weight: 110 lb 0.2 oz (49.9 kg) IBW/kg (Calculated) : 52.4  Vital Signs: BP: 123/77 mmHg (05/23 2349) Pulse Rate: 97 (05/23 2349) Intake/Output from previous day: 05/23 0701 - 05/24 0700 In: 992 [I.V.:412; TPN:560] Out: 3785 [Urine:3500; Emesis/NG output:250; Drains:35] Intake/Output from this shift: Total I/O In: -  Out: 800 [Urine:800]  Labs:  Recent Labs  07/18/13 0515 07/19/13 0440  WBC 11.7* 10.8*  HGB 9.3* 9.4*  HCT 27.7* 28.2*  PLT 379 392     Recent Labs  07/18/13 0515 07/19/13 0440  NA 138 138  K 3.9 3.8  CL 102 101  CO2 28 28  GLUCOSE 130* 125*  BUN 10 12  CREATININE 0.37* 0.37*  CALCIUM 8.2* 8.4  MG 1.9  --   PHOS 3.6  --   Corrected Calcium = 10.2 Estimated Creatinine Clearance: 56.7 ml/min (by C-G formula based on Cr of 0.37).    Recent Labs  07/19/13 1754 07/19/13 2321 07/20/13 0612  GLUCAP 139* 127* 136*   Nutritional Goals:  RD Recommendations: Kcal 1500-1700, Protein 60-75g, Fluid 1.5-1.7L/day Clinimix-E 5/15 at a goal rate of 48ml/hr + 20% fat emulsion at 10 ml/hr to provide: 1502 kCal/day, 72 g/day protein  Current nutrition:  NPO - NG placed 5/21, clamped this AM Clinimix-E 5/15 at 60 mL/hr Fat Emulsion 20% at 10 mL/hr IVF: NS + KCl 20 mEq/L @ 0 ml/min   CBGs & Insulin requirements past 24 hours:  CBGs 127-136 Novolog 3 units sliding scale coverage (on sensitive scale q6h)  Assessment:  63 yo F s/p ex lap, colostomy, sigmoid colectomy, Hartmann's procedure on 5/13. Patient was unable to tolerate clear liquids and had not eaten since 5/10. TNA was started 5/19 with pharmacy to assist. Patient at risk for  refeeding syndrome due to length of time without eating. Did not tolerate trial of clear liquids. 5/24: NGT placed 5/22, to be clamped this AM. To continue with bowel rest per CCS   TPN day# 5:  Electrolytes: WNL Renal function: SCr and BUN below ULN, stable. Hepatic function: all LFTs below ULN. Pre-Albumin: baseline 5.6 (5/19), 5.4 (5/20) TG: 128 (5/20) Glucose: CBGs < 150. No hx of DM.   Plan:  1) Continue Clinimix E 5/15 at 60 ml/hr (goal rate)  TPN to contain standard multivitamins and trace elements daily. 2) Continue IV Lipids 20% at 10 ml/hr  3) Continue Novolog SSI sensitive scale q6h coverage 4) TNA labs on Monday and Thursdays as per protocol   Thank you for the consult.  Johny Drilling, PharmD, BCPS Pager: [phone] Pharmacy: [phone] 07/20/2013 10:09 AM

## 2013-07-20 NOTE — Progress Notes (Signed)
CRITICAL VALUE ALERT  Critical value received: +Gram cocci in peritoneal cavity from cx  Date of notification:  07/20/2013  Time of notification:  8916  Critical value read back:yes  Nurse who received alert:  Aleene Davidson  MD notified (1st page):  Dr. Marlou Starks   Time of first page:  1337  MD notified (2nd page):  Time of second page:  Responding MD:  Dr. Marlou Starks  Time MD responded:  1348  No new orders received at this time from responding physician.

## 2013-07-20 NOTE — Progress Notes (Signed)
11 Days Post-Op  Subjective: She feels a little better today  Objective: Vital signs in last 24 hours: Temp:  [98.4 F (36.9 C)] 98.4 F (36.9 C) (05/23 2050) Pulse Rate:  [84-106] 97 (05/23 2349) Resp:  [16-18] 18 (05/23 2050) BP: (111-133)/(72-83) 123/77 mmHg (05/23 2349) SpO2:  [95 %-97 %] 97 % (05/23 2050) Last BM Date: 07/19/13  Intake/Output from previous day: 05/23 0701 - 05/24 0700 In: 992 [I.V.:412; TPN:560] Out: 3785 [Urine:3500; Emesis/NG output:250; Drains:35] Intake/Output this shift:    Resp: clear to auscultation bilaterally Cardio: regular rate and rhythm GI: soft, nontender. open wound clean. 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 Followup CT scanning for postoperative ileus demonstrates peripherally enhancing fluid collections in the right medial inguinal recess and a deep pelvic cul-de-sac concerning for abscesses. CT-guided drain placement is warranted.  EXAM: CT IMAGE GUIDED DRAINAGE BY PERCUTANEOUS CATHETER; CT IMAGE GUIDED DRAINAGE PERCUT CATH PERITONEAL RETROPERIT  Date: 07/18/2013  PROCEDURE: 1. CT-guided drain placement into deep cul-de-sac collection via right trans gluteal approach 2. CT-guided drain placement into right medial inguinal recess  collection via right lower quadrant approach Interventional Radiologist:  Criselda Peaches, MD  ANESTHESIA/SEDATION: Moderate (conscious) sedation was used. Two mg Versed, 100 mcg Fentanyl were administered intravenously. The patient's vital signs were monitored continuously by radiology nursing throughout the procedure.  Sedation Time: 45 minutes  TECHNIQUE: Informed consent was obtained from the patient following explanation of the procedure, risks, benefits and alternatives. The patient understands, agrees and consents for the procedure. All questions were addressed. A time out was performed.  The patient was first position in the left lateral decubitus position. A planning axial CT scan was performed. The deep pelvic cul-de-sac collection was identified. A suitable skin entry site was selected and marked. Local anesthesia was attained by 1% lidocaine. A small dermatotomy was made. Under real-time fluoroscopic guidance, an 18 gauge trocar needle was carefully advanced into the fluid collection. A wire was then coiled within the fluid and the tract serially dilated to 12 [place]. A Cook 12 [place] all-purpose drainage catheter modified with several additional sideholes was then advanced over the wire and formed within the fluid collection. A total of approximately 75 mL of turbid serosanguineous peritoneal fluid was aspirated. The catheter was flushed and connected to JP bulb suction. The catheter was then secured to the skin using 0 Prolene suture and an adhesive fixation device. A sterile bandage was placed.  The patient was then repositioned into the supine position. A second planning axial CT scan was performed. The right lower quadrant fluid and gas collection was localized and a suitable skin entry site selected and marked. Local anesthesia was again attained by infiltration with  1% lidocaine. Under intermittent CT fluoroscopic guidance, an 18 gauge trocar needle was carefully advanced and the fluid  collection. A wire was coiled within the collection. The tract was dilated to 10 [place] and a [place] all-purpose drainage catheter was performed. Approximately 20 mL of frankly purulent fluid was aspirated. The cavity was lavaged with 20 cc of saline. The catheter was connected to JP bulb suction and secured to the skin with 0 Prolene suture and an adhesive fixation device.  There was no immediate complications; the patient tolerated the procedure well.  IMPRESSION: 1. Placement of a 12 French drain into the pelvic cul-de-sac fluid collection with aspiration of approximately 75 mL of turbid serosanguineous peritoneal fluid. A sample was sent for culture. 2. Placement of a 10 French drain in the right lower quadrant medial inguinal recess collection with aspiration of 15 mL of frankly purulent material. A sample was sent for culture. Drains were connected to JP bulb suction. Recommend repeat CT scan with oral and IV contrast prior to drain removal.  Signed,  Criselda Peaches, MD  [facility]  [facility]   Electronically Signed   By: Jacqulynn Cadet M.D.   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 Followup CT scanning for postoperative ileus demonstrates peripherally enhancing fluid collections in the right medial inguinal recess and a deep pelvic cul-de-sac concerning for abscesses. CT-guided drain placement is warranted.  EXAM: CT IMAGE GUIDED DRAINAGE BY PERCUTANEOUS CATHETER; CT IMAGE GUIDED DRAINAGE PERCUT CATH PERITONEAL RETROPERIT  Date: 07/18/2013  PROCEDURE: 1. CT-guided drain placement into deep cul-de-sac collection via right trans gluteal approach 2. CT-guided drain placement into right medial  inguinal recess collection via right lower quadrant approach Interventional Radiologist:  Criselda Peaches, MD  ANESTHESIA/SEDATION: Moderate (conscious) sedation was used. Two mg Versed, 100 mcg Fentanyl were administered intravenously. The patient's vital signs were monitored continuously by radiology nursing throughout the procedure.  Sedation Time: 45 minutes  TECHNIQUE: Informed consent was obtained from the patient following explanation of the procedure, risks, benefits and alternatives. The patient understands, agrees and consents for the procedure. All questions were addressed. A time out was performed.  The patient was first position in the left lateral decubitus position. A planning axial CT scan was performed. The deep pelvic cul-de-sac collection was identified. A suitable skin entry site was selected and marked. Local anesthesia was attained by 1% lidocaine. A small dermatotomy was made. Under real-time fluoroscopic guidance, an 18 gauge trocar needle was carefully advanced into the fluid collection. A wire was then coiled within the fluid and the tract serially dilated to 12 [place]. A Cook 12 [place] all-purpose drainage catheter modified with several additional sideholes was then advanced over the wire and formed within the fluid collection. A total of approximately 75 mL of turbid serosanguineous peritoneal fluid was aspirated. The catheter was flushed and connected to JP bulb suction. The catheter was then secured to the skin using 0 Prolene suture and an adhesive fixation device. A sterile bandage was placed.  The patient was then repositioned into the supine position. A second planning axial CT scan was performed. The right lower quadrant fluid and gas collection was localized and a suitable skin entry site selected and marked. Local anesthesia was again attained by infiltration  with 1% lidocaine. Under intermittent CT fluoroscopic guidance, an 18 gauge trocar needle was carefully advanced and  the fluid collection. A wire was coiled within the collection. The tract was dilated to 10 [place] and a [place] all-purpose drainage catheter was performed. Approximately 20 mL of frankly purulent fluid was aspirated. The cavity was lavaged with 20 cc of saline. The catheter was connected to JP bulb suction and secured to the skin with 0 Prolene suture and an adhesive fixation device.  There was no immediate complications; the patient tolerated the procedure well.  IMPRESSION: 1. Placement of a 12 French drain into the pelvic cul-de-sac fluid collection with aspiration of approximately 75 mL of turbid serosanguineous peritoneal fluid. A sample was sent for culture. 2. Placement of a 10 French drain in the right lower quadrant medial inguinal recess collection with aspiration of 15 mL of frankly purulent material. A sample was sent for culture. Drains were connected to JP bulb suction. Recommend repeat CT scan with oral and IV contrast prior to drain removal.  Signed,  Criselda Peaches, MD  [facility]  [facility]   Electronically Signed   By: Jacqulynn Cadet M.D.   On: 07/18/2013 13:27    Anti-infectives: Anti-infectives   Start     Dose/Rate Route Frequency Ordered Stop   07/10/13 1700  ertapenem (INVANZ) 1 g in sodium chloride 0.9 % 50 mL IVPB     1 g 100 mL/hr over 30 Minutes Intravenous Every 24 hours 07/09/13 1659     07/09/13 1645  [MAR Hold]  ertapenem (INVANZ) 1 g in sodium chloride 0.9 % 50 mL IVPB     (On MAR Hold since 07/09/13 1741)   1 g 100 mL/hr over 30 Minutes Intravenous  Once 07/09/13 1626 07/09/13 1751      Assessment/Plan: s/p Procedure(s): EXPLORATORY LAPAROTOMY, COLOSTOMY, SIGMOID COLECTOMY, HARTMANS PROCEDURE (N/A) Will clamp ng today and see how she does Continue dressing changes Continue invanz  LOS: 11 days    Kristen Reyes 07/20/2013

## 2013-07-21 LAB — COMPREHENSIVE METABOLIC PANEL
ALBUMIN: 1.6 g/dL — AB (ref 3.5–5.2)
ALK PHOS: 132 U/L — AB (ref 39–117)
ALT: 15 U/L (ref 0–35)
AST: 24 U/L (ref 0–37)
BILIRUBIN TOTAL: 0.3 mg/dL (ref 0.3–1.2)
BUN: 14 mg/dL (ref 6–23)
CHLORIDE: 104 meq/L (ref 96–112)
CO2: 27 mEq/L (ref 19–32)
Calcium: 8.4 mg/dL (ref 8.4–10.5)
Creatinine, Ser: 0.37 mg/dL — ABNORMAL LOW (ref 0.50–1.10)
GFR calc Af Amer: >90 mL/min (ref >90)
GFR calc non Af Amer: >90 mL/min (ref >90)
Glucose, Bld: 133 mg/dL — ABNORMAL HIGH (ref 70–99)
POTASSIUM: 3.8 meq/L (ref 3.7–5.3)
SODIUM: 141 meq/L (ref 137–147)
Total Protein: 5.3 g/dL — ABNORMAL LOW (ref 6.0–8.3)

## 2013-07-21 LAB — CULTURE, ROUTINE-ABSCESS
Culture: NO GROWTH
SPECIAL REQUESTS: NORMAL

## 2013-07-21 LAB — BODY FLUID CULTURE: SPECIAL REQUESTS: NORMAL

## 2013-07-21 LAB — GLUCOSE, CAPILLARY
GLUCOSE-CAPILLARY: 129 mg/dL — AB (ref 70–99)
GLUCOSE-CAPILLARY: 115 mg/dL — AB (ref 70–99)
Glucose-Capillary: 125 mg/dL — ABNORMAL HIGH (ref 70–99)
Glucose-Capillary: 125 mg/dL — ABNORMAL HIGH (ref 70–99)

## 2013-07-21 LAB — PHOSPHORUS: Phosphorus: 3.2 mg/dL (ref 2.3–4.6)

## 2013-07-21 LAB — CBC
HEMATOCRIT: 26.3 % — AB (ref 36.0–46.0)
HEMOGLOBIN: 8.9 g/dL — AB (ref 12.0–15.0)
MCH: 29.4 pg (ref 26.0–34.0)
MCHC: 33.8 g/dL (ref 30.0–36.0)
MCV: 86.8 fL (ref 78.0–100.0)
Platelets: 454 10*3/uL — ABNORMAL HIGH (ref 150–400)
RBC: 3.03 MIL/uL — ABNORMAL LOW (ref 3.87–5.11)
RDW: 13.3 % (ref 11.5–15.5)
WBC: 8.6 10*3/uL (ref 4.0–10.5)

## 2013-07-21 LAB — DIFFERENTIAL
Basophils Absolute: 0 10*3/uL (ref 0.0–0.1)
Basophils Relative: 0 % (ref 0–1)
EOS ABS: 0.2 10*3/uL (ref 0.0–0.7)
Eosinophils Relative: 2 % (ref 0–5)
LYMPHS ABS: 0.7 10*3/uL (ref 0.7–4.0)
Lymphocytes Relative: 8 % — ABNORMAL LOW (ref 12–46)
MONO ABS: 0.9 10*3/uL (ref 0.1–1.0)
MONOS PCT: 10 % (ref 3–12)
Neutro Abs: 6.9 10*3/uL (ref 1.7–7.7)
Neutrophils Relative %: 80 % — ABNORMAL HIGH (ref 43–77)

## 2013-07-21 LAB — MAGNESIUM: MAGNESIUM: 2.1 mg/dL (ref 1.5–2.5)

## 2013-07-21 LAB — PREALBUMIN: Prealbumin: 8 mg/dL — ABNORMAL LOW (ref 17.0–34.0)

## 2013-07-21 LAB — TRIGLYCERIDES: Triglycerides: 116 mg/dL (ref <150)

## 2013-07-21 MED ORDER — TRACE MINERALS CR-CU-F-FE-I-MN-MO-SE-ZN IV SOLN
INTRAVENOUS | Status: AC
  Administered 2013-07-21: 18:00:00 via INTRAVENOUS
  Filled 2013-07-21: qty 2000

## 2013-07-21 MED ORDER — POTASSIUM CHLORIDE IN NACL 20-0.9 MEQ/L-% IV SOLN
INTRAVENOUS | Status: DC
  Administered 2013-07-22: via INTRAVENOUS
  Filled 2013-07-21 (×3): qty 1000

## 2013-07-21 MED ORDER — FAT EMULSION 20 % IV EMUL
250.0000 mL | INTRAVENOUS | Status: AC
  Administered 2013-07-21: 250 mL via INTRAVENOUS
  Filled 2013-07-21: qty 250

## 2013-07-21 NOTE — Progress Notes (Signed)
Subjective: Pt resting, feels a little better each day NG has been removed and she is sipping some clears.  Objective: Physical Exam: BP 113/75  Pulse 98  Temp(Src) 97.9 F (36.6 C) (Oral)  Resp 18  Ht 5\' 3"  (1.6 m)  Wt 110 lb 0.2 oz (49.9 kg)  BMI 19.49 kg/m2  SpO2 97% Drains intact, sites clean. Decreased output from both, mostly serosanguinous, small amt debris, no purulence.   Labs: CBC  Recent Labs  07/19/13 0440 07/21/13 0355  WBC 10.8* 8.6  HGB 9.4* 8.9*  HCT 28.2* 26.3*  PLT 392 454*   BMET  Recent Labs  07/19/13 0440 07/21/13 0355  NA 138 141  K 3.8 3.8  CL 101 104  CO2 28 27  GLUCOSE 125* 133*  BUN 12 14  CREATININE 0.37* 0.37*  CALCIUM 8.4 8.4   LFT  Recent Labs  07/21/13 0355  PROT 5.3*  ALBUMIN 1.6*  AST 24  ALT 15  ALKPHOS 132*  BILITOT 0.3   Assessment/Plan: Abdominal and pelvic abscesses post major abdominal surgery S/p CT guided perc drains on 5/22 WBC has normalized Ileus improving as well Cx pending Other plans per CCS   LOS: 12 days    Kristen Dike PA-C 07/21/2013 11:17 AM

## 2013-07-21 NOTE — Progress Notes (Signed)
PARENTERAL NUTRITION CONSULT NOTE - Follow Up  Pharmacy Consult for TNA Indication: prolonged ileus  Allergies  Allergen Reactions  . Erythromycin Base Other (See Comments)    stomach pains   . Lidocaine   . Codeine Itching and Rash  . Penicillins Palpitations    Patient Measurements: Height: 5' 3" (160 cm) Weight: 110 lb 0.2 oz (49.9 kg) IBW/kg (Calculated) : 52.4  Vital Signs: Temp: 97.9 F (36.6 C) (05/25 0606) Temp src: Oral (05/25 0606) BP: 113/75 mmHg (05/25 0606) Pulse Rate: 98 (05/25 0606) Intake/Output from previous day: 05/24 0701 - 05/25 0700 In: 1340 [I.V.:480; TPN:840] Out: 3960 [Urine:3750; Emesis/NG output:100; Drains:35; Stool:75] Intake/Output from this shift:    Labs:  Recent Labs  07/19/13 0440 07/21/13 0355  WBC 10.8* 8.6  HGB 9.4* 8.9*  HCT 28.2* 26.3*  PLT 392 454*     Recent Labs  07/19/13 0440 07/21/13 0355  NA 138 141  K 3.8 3.8  CL 101 104  CO2 28 27  GLUCOSE 125* 133*  BUN 12 14  CREATININE 0.37* 0.37*  CALCIUM 8.4 8.4  MG  --  2.1  PHOS  --  3.2  PROT  --  5.3*  ALBUMIN  --  1.6*  AST  --  24  ALT  --  15  ALKPHOS  --  132*  BILITOT  --  0.3  Corrected Calcium = 10.2 Estimated Creatinine Clearance: 56.7 ml/min (by C-G formula based on Cr of 0.37).    Recent Labs  07/20/13 1757 07/20/13 2340 07/21/13 0603  GLUCAP 135* 130* 129*   Nutritional Goals:  RD Recommendations: Kcal 1500-1700, Protein 60-75g, Fluid 1.5-1.7L/day Clinimix-E 5/15 at a goal rate of 13m/hr + 20% fat emulsion at 10 ml/hr to provide: 1502 kCal/day, 72 g/day protein  Current nutrition:  NPO - NG placed 5/21, clamped 5/24 Clinimix-E 5/15 at 60 mL/hr Fat Emulsion 20% at 10 mL/hr IVF: NS + KCl 20 mEq/L @ 0 ml/min   CBGs & Insulin requirements past 24 hours:  CBGs 129-136 Novolog 5 units sliding scale coverage (on sensitive scale q6h)  Assessment:  63yo F s/p ex lap, colostomy, sigmoid colectomy, Hartmann's procedure on 5/13. Patient  was unable to tolerate clear liquids and had not eaten since 5/10. TNA was started 5/19 with pharmacy to assist. Patient at risk for refeeding syndrome due to length of time without eating. Did not tolerate trial of clear liquids.  5/25: POD12. NGT placed 5/22, clamped 5/24. To continue with bowel rest per CCS   TPN day# 6:  Electrolytes: WNL Renal function: SCr and BUN below ULN, stable. Hepatic function: AST/ALT wnl, Alk Phos slightly increased from 71 > 132 Pre-Albumin: baseline 5.6 (5/19), 5.4 (5/20), pending today TG: 128 (5/20), pending today Glucose: CBGs < 150. No hx of DM.   Plan:   Continue Clinimix E 5/15 at 60 ml/hr (goal rate)  TPN to contain standard multivitamins and trace elements daily.  Continue IV Lipids 20% at 10 ml/hr   Continue Novolog SSI sensitive scale q6h coverage  TNA labs on Monday and Thursdays as per protocol     EPeggyann Juba PharmD, BCPS Pager: [phone]: [phone]/25/2015 7:32 AM

## 2013-07-21 NOTE — Progress Notes (Addendum)
Physical Therapy Treatment Patient Details Name: KRISHA BEEGLE MRN: [id number] DOB: [birth year] Today's Date: 07-25-13    History of Present Illness 63 yo female s/p exp lap, colostomy, Hartmann's procedure 5/13.     PT Comments    Pt progressing, may want to try cane next visit;  Follow Up Recommendations  No PT follow up;Supervision - Intermittent     Equipment Recommendations  None recommended by PT    Recommendations for Other Services       Precautions / Restrictions Precautions Precautions: Other (comment) Precaution Comments: colostomy, abd surgery; 2 JP drains     Mobility  Bed Mobility Overal bed mobility: Needs Assistance Bed Mobility: Supine to Sit     Supine to sit: Supervision     General bed mobility comments: for lines and safety  Transfers Overall transfer level: Needs assistance Equipment used: None Transfers: Sit to/from Stand;Stand Pivot Transfers Sit to Stand: Supervision         General transfer comment: for safety and line management  Ambulation/Gait Ambulation/Gait assistance: Min guard;Supervision Ambulation Distance (Feet): 500 Feet Assistive device: None (IV pole, wants to touch rail in hallway) Gait Pattern/deviations: Step-through pattern Gait velocity: decreased   General Gait Details: min/guard for safety, balance without  AD; supervision for safety with IV pole   Stairs            Wheelchair Mobility    Modified Rankin (Stroke Patients Only)       Balance                                    Cognition Arousal/Alertness: Awake/alert Behavior During Therapy: WFL for tasks assessed/performed Overall Cognitive Status: Within Functional Limits for tasks assessed                      Exercises General Exercises - Lower Extremity Heel Raises: 15 reps;Strengthening;Both;Standing Mini-Sqauts: Strengthening;Both;15 reps;Standing    General Comments        Pertinent Vitals/Pain  No c/o pain    Home Living                      Prior Function            PT Goals (current goals can now be found in the care plan section) Acute Rehab PT Goals Patient Stated Goal: home soon. regain independence Time For Goal Achievement: 07/29/13 Potential to Achieve Goals: Good Progress towards PT goals: Progressing toward goals    Frequency  Min 3X/week    PT Plan Current plan remains appropriate    Co-evaluation             End of Session Equipment Utilized During Treatment: Gait belt Activity Tolerance: Patient tolerated treatment well Patient left: in chair;with call bell/phone within reach     Time: 1100-1114 PT Time Calculation (min): 14 min  Charges:  $Gait Training: 8-22 mins                    G Codes:      Neil Crouch Jul 25, 2013, 1:23 PM

## 2013-07-21 NOTE — Progress Notes (Signed)
12 Days Post-Op  Subjective: She feels a little better everyday. No nausea with ng clamped  Objective: Vital signs in last 24 hours: Temp:  [97.9 F (36.6 C)-98.3 F (36.8 C)] 97.9 F (36.6 C) (05/25 0606) Pulse Rate:  [98-114] 98 (05/25 0606) Resp:  [18] 18 (05/25 0606) BP: (112-123)/(75-80) 113/75 mmHg (05/25 0606) SpO2:  [97 %-98 %] 97 % (05/25 0606) Last BM Date: 07/20/13  Intake/Output from previous day: 05/24 0701 - 05/25 0700 In: 1340 [I.V.:480; TPN:840] Out: 3960 [Urine:3750; Emesis/NG output:100; Drains:35; Stool:75] Intake/Output this shift:    Resp: clear to auscultation bilaterally Cardio: regular rate and rhythm GI: soft, mild tenderness. good bs. wound clean. ostomy with some output  Lab Results:   Recent Labs  07/19/13 0440 07/21/13 0355  WBC 10.8* 8.6  HGB 9.4* 8.9*  HCT 28.2* 26.3*  PLT 392 454*   BMET  Recent Labs  07/19/13 0440 07/21/13 0355  NA 138 141  K 3.8 3.8  CL 101 104  CO2 28 27  GLUCOSE 125* 133*  BUN 12 14  CREATININE 0.37* 0.37*  CALCIUM 8.4 8.4   PT/INR No results found for this basename: LABPROT, INR,  in the last 72 hours ABG No results found for this basename: PHART, PCO2, PO2, HCO3,  in the last 72 hours  Studies/Results: No results found.  Anti-infectives: Anti-infectives   Start     Dose/Rate Route Frequency Ordered Stop   07/10/13 1700  ertapenem (INVANZ) 1 g in sodium chloride 0.9 % 50 mL IVPB     1 g 100 mL/hr over 30 Minutes Intravenous Every 24 hours 07/09/13 1659     07/09/13 1645  [MAR Hold]  ertapenem (INVANZ) 1 g in sodium chloride 0.9 % 50 mL IVPB     (On MAR Hold since 07/09/13 1741)   1 g 100 mL/hr over 30 Minutes Intravenous  Once 07/09/13 1626 07/09/13 1751      Assessment/Plan: s/p Procedure(s): EXPLORATORY LAPAROTOMY, COLOSTOMY, SIGMOID COLECTOMY, HARTMANS PROCEDURE (N/A) Advance diet. Start clears today D/c ng Ambulate   LOS: 12 days    Kristen Reyes 07/21/2013

## 2013-07-22 LAB — COMPREHENSIVE METABOLIC PANEL
ALT: 30 U/L (ref 0–35)
AST: 55 U/L — ABNORMAL HIGH (ref 0–37)
Albumin: 1.6 g/dL — ABNORMAL LOW (ref 3.5–5.2)
Alkaline Phosphatase: 199 U/L — ABNORMAL HIGH (ref 39–117)
BILIRUBIN TOTAL: 0.3 mg/dL (ref 0.3–1.2)
BUN: 14 mg/dL (ref 6–23)
CHLORIDE: 105 meq/L (ref 96–112)
CO2: 27 mEq/L (ref 19–32)
Calcium: 8.4 mg/dL (ref 8.4–10.5)
Creatinine, Ser: 0.38 mg/dL — ABNORMAL LOW (ref 0.50–1.10)
GLUCOSE: 126 mg/dL — AB (ref 70–99)
POTASSIUM: 4 meq/L (ref 3.7–5.3)
Sodium: 140 mEq/L (ref 137–147)
TOTAL PROTEIN: 5.3 g/dL — AB (ref 6.0–8.3)

## 2013-07-22 LAB — GLUCOSE, CAPILLARY
GLUCOSE-CAPILLARY: 117 mg/dL — AB (ref 70–99)
Glucose-Capillary: 125 mg/dL — ABNORMAL HIGH (ref 70–99)
Glucose-Capillary: 128 mg/dL — ABNORMAL HIGH (ref 70–99)
Glucose-Capillary: 117 mg/dL — ABNORMAL HIGH (ref 70–99)

## 2013-07-22 MED ORDER — FAT EMULSION 20 % IV EMUL
250.0000 mL | INTRAVENOUS | Status: DC
  Administered 2013-07-22: 250 mL via INTRAVENOUS
  Filled 2013-07-22: qty 250

## 2013-07-22 MED ORDER — ENSURE COMPLETE PO LIQD
237.0000 mL | Freq: Two times a day (BID) | ORAL | Status: DC
  Administered 2013-07-22 – 2013-07-25 (×5): 237 mL via ORAL

## 2013-07-22 MED ORDER — INSULIN ASPART 100 UNIT/ML ~~LOC~~ SOLN: 0.0000 [IU] | Freq: Every day | SUBCUTANEOUS | Status: DC

## 2013-07-22 MED ORDER — CLINIMIX E/DEXTROSE (5/15) 5 % IV SOLN
INTRAVENOUS | Status: DC
  Administered 2013-07-22: 18:00:00 via INTRAVENOUS
  Filled 2013-07-22: qty 1000

## 2013-07-22 MED ORDER — INSULIN ASPART 100 UNIT/ML ~~LOC~~ SOLN
0.0000 [IU] | Freq: Three times a day (TID) | SUBCUTANEOUS | Status: DC
  Administered 2013-07-22: 1 [IU] via SUBCUTANEOUS
  Administered 2013-07-23: 2 [IU] via SUBCUTANEOUS

## 2013-07-22 NOTE — Progress Notes (Signed)
PARENTERAL NUTRITION CONSULT NOTE - Follow Up  Pharmacy Consult for TNA Indication: prolonged ileus  Allergies  Allergen Reactions  . Erythromycin Base Other (See Comments)    stomach pains   . Lidocaine   . Codeine Itching and Rash  . Penicillins Palpitations    Patient Measurements: Height: '5\' 3"'  (160 cm) Weight: 110 lb 0.2 oz (49.9 kg) IBW/kg (Calculated) : 52.4  Vital Signs: Temp: 98.2 F (36.8 C) (05/26 8016) Temp src: Oral (05/26 0608) BP: 97/64 mmHg (05/26 5537) Pulse Rate: 99 (05/26 0608) Intake/Output from previous day: 05/25 0701 - 05/26 0700 In: 1839.3 [P.O.:600; I.V.:399.3; IV Piggyback:250; TPN:560] Out: 4827 [Urine:3750; Drains:55; Stool:25] Intake/Output from this shift: Total I/O In: -  Out: 200 [Urine:200]  Labs:  Recent Labs  07/21/13 0355  WBC 8.6  HGB 8.9*  HCT 26.3*  PLT 454*     Recent Labs  07/21/13 0355 07/22/13 0425  NA 141 140  K 3.8 4.0  CL 104 105  CO2 27 27  GLUCOSE 133* 126*  BUN 14 14  CREATININE 0.37* 0.38*  CALCIUM 8.4 8.4  MG 2.1  --   PHOS 3.2  --   PROT 5.3* 5.3*  ALBUMIN 1.6* 1.6*  AST 24 55*  ALT 15 30  ALKPHOS 132* 199*  BILITOT 0.3 0.3  PREALBUMIN 8.0*  --   TRIG 116  --   Corrected Calcium = 10.32 Estimated Creatinine Clearance: 56.7 ml/min (by C-G formula based on Cr of 0.38).    Recent Labs  07/21/13 1704 07/21/13 2325 07/22/13 0602  GLUCAP 115* 125* 125*   Nutritional Goals:  RD Recommendations: Kcal 1500-1700, Protein 60-75g, Fluid 1.5-1.7L/day Clinimix-E 5/15 at a goal rate of 65m/hr + 20% fat emulsion at 10 ml/hr to provide: 1502 kCal/day, 72 g/day protein  Current nutrition:  Advance to FLD 5/26 Clinimix-E 5/15 at 60 mL/hr Fat Emulsion 20% at 10 mL/hr IVF: NS + KCl 20 mEq/L @ 20 ml/min   CBGs & Insulin requirements past 24 hours:  Novolog 4 units sliding scale coverage (on sensitive scale q6h)  Assessment:  63yo F s/p ex lap, colostomy, sigmoid colectomy, Hartmann's procedure  on 5/13. Patient was unable to tolerate clear liquids and had not eaten since 5/10. TNA was started 5/19 with pharmacy to assist. Patient at risk for refeeding syndrome due to length of time without eating. Did not tolerate trial of clear liquids.  5/26: POD13. NGT removed 5/25. Tolerating CLD, advancing FLD today and begin weaning TNA.  TPN day# 7:  Electrolytes: WNL Renal function: SCr and BUN below ULN, stable. Hepatic function: AST and Alk Phos slightly increased - possibly TNA related  Pre-Albumin: baseline 5.6 (5/19), 5.4 (5/20), improved 8.0 (5/15) TG: 128 (5/20), 116 (5/25) Glucose: CBGs at goal < 150. No hx of DM.   Plan:   Decrease Clinimix E 5/15 to 40 ml/hr  TPN to contain standard multivitamins and trace elements daily.  Decrease IV Lipids 20% to 5 ml/hr   Continue Novolog SSI sensitive scale, change to ACHS  TNA labs on Monday and Thursdays as per protocol   Follow ability to tolerate diet and dc TNA   EPeggyann Juba PharmD, BCPS Pager: [phone]: [phone]/26/2015 9:05 AM

## 2013-07-22 NOTE — Progress Notes (Signed)
NUTRITION FOLLOW UP  Intervention:   - TPN per pharmacist - Ensure Complete BID - Educated pt and husband low fiber diet therapy for new colostomy, handouts provided - Diet advancement per MD - Will continue to monitor   Nutrition Dx:   Inadequate oral intake related to inability to eat as evidenced by NPO - ongoing but now related to poor appetite, eating small amounts slowly as evidenced by pt report.    Goal:   TPN to meet >90% of estimated nutritional needs - met currently but plans are to start weaning later on today  New goal: Pt to consume >50% of meals/supplements   Monitor:   Weights, labs, diet advancement, colostomy output, intake   Assessment:   Pt is a relatively healthy woman who started having pain and bloating about 12 days PTA. She saw her PCP on 5/4 and was treated for constipation. She had several doses of laxatives with some good results, but she continued to have pain and bloating. Fever on 5/10 up to 102.2 degrees Farenheit. Per MD, plain film showed: Free extraluminal gas is consistent with a perforated viscus. This may be related to the suspected acute colitis. Had exploratory laparotomy with colostomy, sigmoid colectomy, and Hartman's procedure. TPN ordered to start today for anticipated prolonged ileus.   5/19: -Met with pt who reports being NPO since 5/10  -Before then she had a good appetite and was eating 3 well balanced meals/day  -States her weight has been stable  -Reports 3 episodes of vomiting yesterday after starting clear liquid diet  -Denies any nausea or vomiting today  -Reports having some output from colostomy today  5/26: -Had 4 episodes of dark green emesis 5/21, NGT placed 5/21 -Had 2 drains placed 5/22 - one in deep cul-de-sac collection and one in right medial inguinal recess collection -NGT d/c 5/25, diet advanced to full liquids this morning -Plan is to start weaning TPN today -Met with pt and husband who report pt did well with  clear liquids yesterday and was working on full liquid tray during visit. Pt denied any nausea. Reports she is taking it slow with the food since it's been so long since she has eaten.  -Husband had questions about diet at home, educated pt and wife on low fiber diet for new colostomy, with gradual addition of fiber, and provided handouts with RD contact information, teach back method used, expect good compliance.  TPN: Clinimix-E 5/15 at a goal rate of 51m/hr + 20% fat emulsion at 10 ml/hr to provide: 1502 kCal/day, 72 g/day protein - meeting 100% of estimated nutritional needs   Alk phos elevated and trending up AST elevated PALB low but improving Triglycerides WNL CBGs < 1559mdL    Height: Ht Readings from Last 1 Encounters:  07/09/13 '5\' 3"'  (1.6 m)    Weight Status:   Wt Readings from Last 1 Encounters:  07/09/13 110 lb 0.2 oz (49.9 kg)    Re-estimated needs:  Kcal: 1500-1700  Protein: 60-75g  Fluid: 1.5-1.7L/day   Skin: Abdominal incision    Diet Order: Full Liquid   Intake/Output Summary (Last 24 hours) at 07/22/13 1024 Last data filed at 07/22/13 1010  Gross per 24 hour  Intake 1739.33 ml  Output   3850 ml  Net -2110.67 ml    Last BM: 5/26   Labs:   Recent Labs Lab 07/17/13 0445 07/18/13 0515 07/19/13 0440 07/21/13 0355 07/22/13 0425  NA 140 138 138 141 140  K 3.8 3.9 3.8 3.8  4.0  CL 101 102 101 104 105  CO2 '30 28 28 27 27  ' BUN '11 10 12 14 14  ' CREATININE 0.39* 0.37* 0.37* 0.37* 0.38*  CALCIUM 8.3* 8.2* 8.4 8.4 8.4  MG 2.0 1.9  --  2.1  --   PHOS 2.9 3.6  --  3.2  --   GLUCOSE 148* 130* 125* 133* 126*    CBG (last 3)   Recent Labs  07/21/13 1704 07/21/13 2325 07/22/13 0602  GLUCAP 115* 125* 125*    Scheduled Meds: . antiseptic oral rinse  15 mL Mouth Rinse q12n4p  . chlorhexidine  15 mL Mouth Rinse BID  . enoxaparin (LOVENOX) injection  40 mg Subcutaneous Q24H  . ertapenem  1 g Intravenous Q24H  . famotidine (PEPCID) IV  20 mg  Intravenous Q12H  . insulin aspart  0-5 Units Subcutaneous QHS  . insulin aspart  0-9 Units Subcutaneous TID WC  . lip balm  1 application Topical BID  . metoprolol  2.5 mg Intravenous 4 times per day    Continuous Infusions: . 0.9 % NaCl with KCl 20 mEq / L 20 mL/hr at 07/22/13 0600  . Marland KitchenTPN (CLINIMIX-E) Adult 60 mL/hr at 07/21/13 1739   And  . fat emulsion 250 mL (07/21/13 1739)  . Marland KitchenTPN (CLINIMIX-E) Adult     And  . fat emulsion      Mikey College MS, RD, LDN [phone] Pager [phone] After Hours Pager

## 2013-07-22 NOTE — Progress Notes (Signed)
Agree.  Likely can remove drains later this week.

## 2013-07-22 NOTE — Progress Notes (Signed)
13 Days Post-Op  Subjective: Pt doing well today, no N/V, tolerating clear liquids, hungry and thirsty, but appetite low.  Ambulating.  On TPN.  Having BM's and flatus in bag.  [place] nursing working with the patient.  WBC normal yesterday, afebrile.  Objective: Vital signs in last 24 hours: Temp:  [97.9 F (36.6 C)-98.5 F (36.9 C)] 98.2 F (36.8 C) (05/26 1610) Pulse Rate:  [99-109] 99 (05/26 0608) Resp:  [16-18] 16 (05/26 0608) BP: (97-111)/(59-71) 97/64 mmHg (05/26 0608) SpO2:  [99 %-100 %] 99 % (05/26 0608) Last BM Date: 07/21/13  Intake/Output from previous day: 05/25 0701 - 05/26 0700 In: 1839.3 [P.O.:600; I.V.:399.3; IV Piggyback:250; TPN:560] Out: 9604 [Urine:3750; Drains:55; Stool:25] Intake/Output this shift:    PE: Gen:  Alert, NAD, pleasant Abd: Soft, NT/ND, +BS, no HSM, midline wound clean with good beefy red granulation tissue, gluteal drain with minimal serosanguinous drainage (33mL), RLQ drain with mostly serosanguinous with some tissue/mucousy fluid as well (72mL)   Lab Results:   Recent Labs  07/21/13 0355  WBC 8.6  HGB 8.9*  HCT 26.3*  PLT 454*   BMET  Recent Labs  07/21/13 0355 07/22/13 0425  NA 141 140  K 3.8 4.0  CL 104 105  CO2 27 27  GLUCOSE 133* 126*  BUN 14 14  CREATININE 0.37* 0.38*  CALCIUM 8.4 8.4   PT/INR No results found for this basename: LABPROT, INR,  in the last 72 hours CMP     Component Value Date/Time   NA 140 07/22/2013 0425   K 4.0 07/22/2013 0425   CL 105 07/22/2013 0425   CO2 27 07/22/2013 0425   GLUCOSE 126* 07/22/2013 0425   BUN 14 07/22/2013 0425   CREATININE 0.38* 07/22/2013 0425   CALCIUM 8.4 07/22/2013 0425   PROT 5.3* 07/22/2013 0425   ALBUMIN 1.6* 07/22/2013 0425   AST 55* 07/22/2013 0425   ALT 30 07/22/2013 0425   ALKPHOS 199* 07/22/2013 0425   BILITOT 0.3 07/22/2013 0425   GFRNONAA >90 07/22/2013 0425   GFRAA >90 07/22/2013 0425   Lipase     Component Value Date/Time   LIPASE 11 07/09/2013 1415        Studies/Results: No results found.  Anti-infectives: Anti-infectives   Start     Dose/Rate Route Frequency Ordered Stop   07/10/13 1700  ertapenem (INVANZ) 1 g in sodium chloride 0.9 % 50 mL IVPB     1 g 100 mL/hr over 30 Minutes Intravenous Every 24 hours 07/09/13 1659     07/09/13 1645  [MAR Hold]  ertapenem (INVANZ) 1 g in sodium chloride 0.9 % 50 mL IVPB     (On MAR Hold since 07/09/13 1741)   1 g 100 mL/hr over 30 Minutes Intravenous  Once 07/09/13 1626 07/09/13 1751       Assessment/Plan Perforated colon POD #11 s/p EXPLORATORY LAPAROTOMY, COLOSTOMY, SIGMOID COLECTOMY, HARTMANS PROCEDURE   Plan: Advance diet. Start fulls today, soft tomorrow am if tolerating Ambulate & IS SCD's and lovenox Continue WD dressing changes - home health? Wean TPN Will probably need repeat imaging prior to discontinuing JP drains Last CT was on 07/18/13  May be able to d/c home in the next few days with drains     LOS: 13 days    Coralie Keens 07/22/2013, 8:11 AM Pager: [phone]

## 2013-07-22 NOTE — Progress Notes (Signed)
13 Days Post-Op  Subjective: Pt doing well; no new c/o  Objective: Vital signs in last 24 hours: Temp:  [97.9 F (36.6 C)-98.5 F (36.9 C)] 98.1 F (36.7 C) (05/26 1057) Pulse Rate:  [99-109] 100 (05/26 1057) Resp:  [16-18] 17 (05/26 1057) BP: (97-111)/(59-71) 105/70 mmHg (05/26 1057) SpO2:  [99 %-100 %] 100 % (05/26 1057) Last BM Date: 07/22/13  Intake/Output from previous day: 05/25 0701 - 05/26 0700 In: 1839.3 [P.O.:600; I.V.:399.3; IV Piggyback:250; TPN:560] Out: 1740 [Urine:3750; Drains:55; Stool:25] Intake/Output this shift: Total I/O In: -  Out: 31 [Urine:400; Stool:20]  RT TG/RLQ drains intact, insertion sites ok, mildly tender at TG site; outputs 40/15 cc's respectively; cx's- rare enterococcus  Lab Results:   Recent Labs  07/21/13 0355  WBC 8.6  HGB 8.9*  HCT 26.3*  PLT 454*   BMET  Recent Labs  07/21/13 0355 07/22/13 0425  NA 141 140  K 3.8 4.0  CL 104 105  CO2 27 27  GLUCOSE 133* 126*  BUN 14 14  CREATININE 0.37* 0.38*  CALCIUM 8.4 8.4   PT/INR No results found for this basename: LABPROT, INR,  in the last 72 hours ABG No results found for this basename: PHART, PCO2, PO2, HCO3,  in the last 72 hours  Studies/Results: No results found.  Anti-infectives: Anti-infectives   Start     Dose/Rate Route Frequency Ordered Stop   07/10/13 1700  ertapenem (INVANZ) 1 g in sodium chloride 0.9 % 50 mL IVPB     1 g 100 mL/hr over 30 Minutes Intravenous Every 24 hours 07/09/13 1659     07/09/13 1645  [MAR Hold]  ertapenem (INVANZ) 1 g in sodium chloride 0.9 % 50 mL IVPB     (On MAR Hold since 07/09/13 1741)   1 g 100 mL/hr over 30 Minutes Intravenous  Once 07/09/13 1626 07/09/13 1751      Assessment/Plan: s/p RLQ/PELVIC CUL DE SAC abscess drainages 5/22; cont current tx; check f/u CT later this week to assess adequacy of drainage   LOS: 13 days    D Rowe Robert 07/22/2013

## 2013-07-23 LAB — GLUCOSE, CAPILLARY
GLUCOSE-CAPILLARY: 118 mg/dL — AB (ref 70–99)
Glucose-Capillary: 131 mg/dL — ABNORMAL HIGH (ref 70–99)
Glucose-Capillary: 106 mg/dL — ABNORMAL HIGH (ref 70–99)

## 2013-07-23 MED ORDER — ADULT MULTIVITAMIN W/MINERALS CH
1.0000 | ORAL_TABLET | Freq: Every day | ORAL | Status: DC
  Administered 2013-07-25: 1 via ORAL
  Filled 2013-07-23 (×2): qty 1

## 2013-07-23 MED ORDER — FAT EMULSION 20 % IV EMUL
250.0000 mL | INTRAVENOUS | Status: AC
  Filled 2013-07-23: qty 250

## 2013-07-23 MED ORDER — TRACE MINERALS CR-CU-F-FE-I-MN-MO-SE-ZN IV SOLN
INTRAVENOUS | Status: AC
  Filled 2013-07-23: qty 1000

## 2013-07-23 NOTE — Consult Note (Signed)
WOC ostomy follow up Stoma type/location: LLQ Colostomy Stomal assessment/size: 1 and 5/8 inches Peristomal assessment: intact, clear Treatment options for stomal/peristomal skin: skin barrier ring Output brown stool Ostomy pouching: 2pc., 2 and 1/4 inches with skin barrier ring Education provided: patient and husband participating in pouch change, resizing, placement of new pouch. Teaching session for pouch emptying and patient and spouse are confident.  Supplies provided for discharge and [facility] to follow. Enrolled patient in [place] Start Discharge program: Yes [place] nursing team will remain available to this patient as needed in the post acute phase of her recovery.  Ready for discharge from a [place] standpoint.  Will continue to practice emptying while here. Thanks, Maudie Flakes, MSN, RN, [place], [place], [place] [phone])

## 2013-07-23 NOTE — Progress Notes (Signed)
14 Days Post-Op  Subjective: Pt doing well.  Tolerating full liquids well, appetite picking up.  Drinking ensures.  Ambulating through the halls.  Ostomy working well.    Objective: Vital signs in last 24 hours: Temp:  [98.1 F (36.7 C)-98.6 F (37 C)] 98.5 F (36.9 C) (05/27 0515) Pulse Rate:  [100-110] 100 (05/27 0515) Resp:  [17-18] 18 (05/27 0515) BP: (100-109)/(60-72) 100/60 mmHg (05/27 0515) SpO2:  [98 %-100 %] 98 % (05/27 0515) Last BM Date: 07/22/13  Intake/Output from previous day: 05/26 0701 - 05/27 0700 In: 1998.7 [P.O.:840; I.V.:478.7; IV Piggyback:50; TPN:600] Out: 4300 [Urine:4250; Drains:30; Stool:20] Intake/Output this shift:    PE: Gen:  Alert, NAD, pleasant Abd: Soft, NT/ND, +BS, no HSM, midline wound clean with good beefy red granulation tissue, gluteal drain with minimal serosanguinous drainage (25mL), RLQ drain with mostly serosanguinous with some tissue/mucousy fluid as well (35mL)   Lab Results:   Recent Labs  07/21/13 0355  WBC 8.6  HGB 8.9*  HCT 26.3*  PLT 454*   BMET  Recent Labs  07/21/13 0355 07/22/13 0425  NA 141 140  K 3.8 4.0  CL 104 105  CO2 27 27  GLUCOSE 133* 126*  BUN 14 14  CREATININE 0.37* 0.38*  CALCIUM 8.4 8.4   PT/INR No results found for this basename: LABPROT, INR,  in the last 72 hours CMP     Component Value Date/Time   NA 140 07/22/2013 0425   K 4.0 07/22/2013 0425   CL 105 07/22/2013 0425   CO2 27 07/22/2013 0425   GLUCOSE 126* 07/22/2013 0425   BUN 14 07/22/2013 0425   CREATININE 0.38* 07/22/2013 0425   CALCIUM 8.4 07/22/2013 0425   PROT 5.3* 07/22/2013 0425   ALBUMIN 1.6* 07/22/2013 0425   AST 55* 07/22/2013 0425   ALT 30 07/22/2013 0425   ALKPHOS 199* 07/22/2013 0425   BILITOT 0.3 07/22/2013 0425   GFRNONAA >90 07/22/2013 0425   GFRAA >90 07/22/2013 0425   Lipase     Component Value Date/Time   LIPASE 11 07/09/2013 1415       Studies/Results: No results found.  Anti-infectives: Anti-infectives   Start     Dose/Rate Route Frequency Ordered Stop   07/10/13 1700  ertapenem (INVANZ) 1 g in sodium chloride 0.9 % 50 mL IVPB     1 g 100 mL/hr over 30 Minutes Intravenous Every 24 hours 07/09/13 1659     07/09/13 1645  [MAR Hold]  ertapenem (INVANZ) 1 g in sodium chloride 0.9 % 50 mL IVPB     (On MAR Hold since 07/09/13 1741)   1 g 100 mL/hr over 30 Minutes Intravenous  Once 07/09/13 1626 07/09/13 1751       Assessment/Plan Perforated colon POD #14 s/p EXPLORATORY LAPAROTOMY, COLOSTOMY, SIGMOID COLECTOMY, HARTMANS PROCEDURE   Plan:  Advance diet. Soft diet this am Ambulate & IS  SCD's and lovenox  Continue WD dressing changes - home health ordered Wean TPN to off today Last CT was on 07/18/13 - Plan for repeat CT/drain study tomorrow with IR to see if drains could potentially come out prior to d/c tomorrow  Hopefully WOC can come by today to do some additional teaching prior to going home   Disp: hopefully tomorrow if all goes well    LOS: 14 days    Coralie Keens 07/23/2013, 7:27 AM Pager: [phone]

## 2013-07-23 NOTE — Progress Notes (Addendum)
Physical Therapy Discharge  Patient Details Name: Kristen Reyes MRN: [id number] DOB: 20-Nov-1950  Pt up ambulating around unit several times a day per PTA.  Pt appears to have met goals and no longer appropriate for skilled PT services.  No PT f/u and intermittent supervision recommended per last PT treatment note. PT to sign off.  Junius Argyle 07/23/2013, 4:52 PM

## 2013-07-23 NOTE — Progress Notes (Signed)
PARENTERAL NUTRITION CONSULT NOTE - Follow Up  Pharmacy Consult for TNA Indication: prolonged ileus  Allergies  Allergen Reactions  . Erythromycin Base Other (See Comments)    stomach pains   . Lidocaine   . Codeine Itching and Rash  . Penicillins Palpitations    Patient Measurements: Height: 5' 3" (160 cm) Weight: 110 lb 0.2 oz (49.9 kg) IBW/kg (Calculated) : 52.4  Vital Signs: Temp: 98.5 F (36.9 C) (05/27 0515) Temp src: Oral (05/27 0515) BP: 100/60 mmHg (05/27 0515) Pulse Rate: 100 (05/27 0515) Intake/Output from previous day: 05/26 0701 - 05/27 0700 In: 1998.7 [P.O.:840; I.V.:478.7; IV Piggyback:50; TPN:600] Out: 4300 [Urine:4250; Drains:30; Stool:20] Intake/Output from this shift:    Labs:  Recent Labs  07/21/13 0355  WBC 8.6  HGB 8.9*  HCT 26.3*  PLT 454*     Recent Labs  07/21/13 0355 07/22/13 0425  NA 141 140  K 3.8 4.0  CL 104 105  CO2 27 27  GLUCOSE 133* 126*  BUN 14 14  CREATININE 0.37* 0.38*  CALCIUM 8.4 8.4  MG 2.1  --   PHOS 3.2  --   PROT 5.3* 5.3*  ALBUMIN 1.6* 1.6*  AST 24 55*  ALT 15 30  ALKPHOS 132* 199*  BILITOT 0.3 0.3  PREALBUMIN 8.0*  --   TRIG 116  --   Corrected Calcium = 10.32 Estimated Creatinine Clearance: 56.7 ml/min (by C-G formula based on Cr of 0.38).    Recent Labs  07/22/13 1654 07/22/13 2138 07/23/13 0737  GLUCAP 117* 117* 131*   Nutritional Goals:  RD Recommendations: Kcal 1500-1700, Protein 60-75g, Fluid 1.5-1.7L/day Clinimix-E 5/15 at a goal rate of 60ml/hr + 20% fat emulsion at 10 ml/hr to provide: 1502 kCal/day, 72 g/day protein  Current nutrition:  Soft Diet Clinimix-E 5/15 at 40 mL/hr Fat Emulsion 20% at 5 mL/hr IVF: NS + KCl 20 mEq/L @ 20 ml/min   CBGs & Insulin requirements past 24 hours:  Novolog 3 units sliding scale coverage (on sensitive scale q6h)  Assessment:  63 yo F s/p ex lap, colostomy, sigmoid colectomy, Hartmann's procedure on 5/13. Patient was unable to tolerate clear  liquids and had not eaten since 5/10. TNA was started 5/19 with pharmacy to assist.  5/27: POD14. NGT removed 5/25. Tolerating FL diet, advanced to soft diet and began weaning TNA.  Plans noted per surgery to wean off TNA today in hopes of possible discharge tomorrow.  TPN day# 8:  Electrolytes: WNL on 5/26 Renal function: SCr and BUN below ULN on 5/26, stable. Hepatic function: AST and Alk Phos slightly increased on 5/26 - possibly TN- related  Pre-Albumin: baseline 5.6 (5/19), 5.4 (5/20), improved 8.0 (5/15) TG: 128 (5/20), 116 (5/25) Glucose: CBGs at goal < 150. No hx of DM.   Plan:  1. Reduce Clinimix-E 5/15 to 20 mL/hr at 4pm, discontinue at 6pm. 2. Discontinue Fat Emulsion at 6pm. 3. Discontinue CBGs, sliding scale insulin, TPN labs. 4. Follow tolerance of PO feedings.  Above plan was reviewed with patient, husband, and RN.  Randy Absher, PharmD, BCPS Pager: [phone] 07/23/2013  10:00 AM   .  

## 2013-07-23 NOTE — Progress Notes (Signed)
Subjective: Patient doing great, she hopes to get drain removed and be discharged home soon.  Objective: Physical Exam: BP 99/64  Pulse 106  Temp(Src) 98 F (36.7 C) (Oral)  Resp 18  Ht 5\' 3"  (1.6 m)  Wt 110 lb 0.2 oz (49.9 kg)  BMI 19.49 kg/m2  SpO2 99%  General: A&Ox3, NAD, sitting in bed Abd: Soft, NT, ND, Right TG drain intact, output 24 hours 5cc, minimal to no fluid in JP bulb, RLQ drain intact, output 24 hours 10cc, minimal to no output in JP bulb now.   Labs: CBC  Recent Labs  07/21/13 0355  WBC 8.6  HGB 8.9*  HCT 26.3*  PLT 454*   BMET  Recent Labs  07/21/13 0355 07/22/13 0425  NA 141 140  K 3.8 4.0  CL 104 105  CO2 27 27  GLUCOSE 133* 126*  BUN 14 14  CREATININE 0.37* 0.38*  CALCIUM 8.4 8.4   LFT  Recent Labs  07/22/13 0425  PROT 5.3*  ALBUMIN 1.6*  AST 55*  ALT 30  ALKPHOS 199*  BILITOT 0.3   PT/INR No results found for this basename: LABPROT, INR,  in the last 72 hours   Studies/Results: No results found.  Assessment/Plan: Abdominal and pelvic abscesses post abdominal surgery  S/p CT guided perc drains on 5/22, Cx enterococcus  Drains with minimal to no output, afebrile, WBC wnl on IV antibiotics Dr. Annamaria Boots has reviewed case and recommends repeat CT tomorrow with IV and oral contrast prior to drain removal.   LOS: 14 days    Hedy Jacob PA-C 07/23/2013 3:00 PM

## 2013-07-24 ENCOUNTER — Encounter (HOSPITAL_COMMUNITY): Payer: Self-pay | Admitting: Radiology

## 2013-07-24 ENCOUNTER — Inpatient Hospital Stay (HOSPITAL_COMMUNITY): Payer: Managed Care, Other (non HMO)

## 2013-07-24 MED ORDER — IOHEXOL 300 MG/ML  SOLN
80.0000 mL | Freq: Once | INTRAMUSCULAR | Status: AC | PRN
  Administered 2013-07-24: 80 mL via INTRAVENOUS

## 2013-07-24 MED ORDER — AMOXICILLIN-POT CLAVULANATE 875-125 MG PO TABS
1.0000 | ORAL_TABLET | Freq: Two times a day (BID) | ORAL | Status: DC
  Administered 2013-07-24 – 2013-07-25 (×3): 1 via ORAL
  Filled 2013-07-24 (×4): qty 1

## 2013-07-24 MED ORDER — IOHEXOL 300 MG/ML  SOLN
25.0000 mL | INTRAMUSCULAR | Status: AC
  Administered 2013-07-24 (×2): 25 mL via ORAL

## 2013-07-24 NOTE — Progress Notes (Signed)
Subjective: Pt feeling okay. Minimal pain Had repeat CT this am  Objective: Physical Exam: BP 110/64  Pulse 106  Temp(Src) 98.9 F (37.2 C) (Oral)  Resp 18  Ht 5\' 3"  (1.6 m)  Wt 110 lb 0.2 oz (49.9 kg)  BMI 19.49 kg/m2  SpO2 97% RLQ drain intact, site clean. Scant output. Drain removed at bedside. TG drain intact, scant output, drain flushed with 5cc NS, minimal return    Labs: CBC No results found for this basename: WBC, HGB, HCT, PLT,  in the last 72 hours BMET  Recent Labs  07/22/13 0425  NA 140  K 4.0  CL 105  CO2 27  GLUCOSE 126*  BUN 14  CREATININE 0.38*  CALCIUM 8.4   LFT  Recent Labs  07/22/13 0425  PROT 5.3*  ALBUMIN 1.6*  AST 55*  ALT 30  ALKPHOS 199*  BILITOT 0.3   PT/INR No results found for this basename: LABPROT, INR,  in the last 72 hours   Studies/Results: Ct Abdomen Pelvis W Contrast  07/24/2013   CLINICAL DATA:  Evaluate pelvic fluid collection. Status post drain placement.  EXAM: CT ABDOMEN AND PELVIS WITH CONTRAST  TECHNIQUE: Multidetector CT imaging of the abdomen and pelvis was performed using the standard protocol following bolus administration of intravenous contrast.  CONTRAST:  59mL OMNIPAQUE IOHEXOL 300 MG/ML  SOLN  COMPARISON:  07/17/2013  FINDINGS: Since the prior study, a pigtail catheter has been placed to the ischiorectal fossa, mostly evacuating the posterior pelvic abscess. Another pigtail catheter has been reduced from an anterior approach in the right lower quadrant, decompressing right anterior pelvic collection. Small residual loculated collections are noted mostly in the posterior pelvis. There is surrounding inflammatory stranding. Edema surrounds the rectum and residual distal sigmoid colon.  There is no new abscess.  Left lower quadrant colostomy is stable. No evidence of bowel obstruction.  There are loops of small bowel that show evidence of wall thickening. This is most evident in the left mid to lower abdomen  posteriorly. There are small bowel o'clock air-fluid levels with a mild diffuse adynamic ileus.  A small amount of ascites noted, decreased from the prior exam.  Small pleural effusions are noted associated with dependent atelectasis. Mild stable scarring in the right middle lobe. Lung bases are otherwise clear.  Liver, spleen, gallbladder, pancreas, adrenal glands, kidneys, ureters, bladder: Unremarkable.  And intermediate attenuation soft tissue mass projects within the endometrial canal, not evident on the prior exam. This may reflect a submucosal fibroid. It could be a polyp.  There are mildly prominent retroperitoneal lymph nodes. There are no pathologically enlarged lymph nodes.  No significant bony abnormality.  IMPRESSION: 1. There has been significant improvement following placement of drainage catheters in to loculated pelvic abscesses. Abscess collections are significantly smaller. There is no new abscess. 2. Small amount of ascites, decreased from the prior study. 3. There are loops of thick-walled small bowel in the left mid to lower abdomen. This is nonspecific. It could be reactive to adjacent inflammation, favored. It could reflect ischemia. 4. Small bowel and colonic air-fluid levels consistent with mild diffuse adynamic ileus. No obstruction. 5. Small mass in the uterus projects into the endometrium. Measures 14 mm in size. It may reflect a submucosal fibroid. An endometrial polyp is possible. Followup hysteroscopy or hysterosonography is recommended on a nonemergent basis 6. Small pleural effusions with associated dependent atelectasis. Pleural effusions have also decreased from the prior study.   Electronically Signed   By: Shanon Brow  Ormond M.D.   On: 07/24/2013 11:03    Assessment/Plan: Abdominal and pelvic abscesses post abdominal surgery  S/p CT guided perc drains on 5/22, Cx enterococcus  Follow up CT today shows near resolution of lower pelvic fluid collections. RLQ drain removed. There  is small collection around TG drain. D/W Coralie Keens PA-C, they are planning trial of Augmentin and observing for another 94RD due to uncertain allergy history. Have flushed TG drain today with 5cc NS(minimal return) Will leave TG drain in overnight and if no output by tomorrow, will remove drain prior to discharge.     LOS: 15 days    Ascencion Dike PA-C 07/24/2013 12:35 PM

## 2013-07-24 NOTE — Progress Notes (Signed)
15 Days Post-Op  Subjective: Pt doing well.  Tolerating diet.  Ambulating well.  Dressing changes going well.  [place] set up.  Anxious to go home and have drains out.  Using IS and improving.  Objective: Vital signs in last 24 hours: Temp:  [98 F (36.7 C)-98.9 F (37.2 C)] 98.9 F (37.2 C) (05/28 0553) Pulse Rate:  [102-106] 102 (05/28 0553) Resp:  [18] 18 (05/28 0553) BP: (99-103)/(64-69) 103/69 mmHg (05/28 0553) SpO2:  [97 %-99 %] 97 % (05/28 0553) Last BM Date: 07/22/13  Intake/Output from previous day: 05/27 0701 - 05/28 0700 In: 1081.3 [P.O.:840; I.V.:161.3; IV Piggyback:50] Out: 2435 [Urine:2400; Drains:15; Stool:20] Intake/Output this shift:    PE: Gen:  Alert, NAD, pleasant Pulm:  IS improving Abd: Soft, minimally tender at midline wound, ND, +BS, no HSM, midline wound clean with good beefy red granulation tissue, gluteal drain with minimal serosanguinous drainage (41mL), RLQ drain with mostly serosanguinous with minimal tissue/mucousy fluid as well (33mL)    Lab Results:  No results found for this basename: WBC, HGB, HCT, PLT,  in the last 72 hours BMET  Recent Labs  07/22/13 0425  NA 140  K 4.0  CL 105  CO2 27  GLUCOSE 126*  BUN 14  CREATININE 0.38*  CALCIUM 8.4   PT/INR No results found for this basename: LABPROT, INR,  in the last 72 hours CMP     Component Value Date/Time   NA 140 07/22/2013 0425   K 4.0 07/22/2013 0425   CL 105 07/22/2013 0425   CO2 27 07/22/2013 0425   GLUCOSE 126* 07/22/2013 0425   BUN 14 07/22/2013 0425   CREATININE 0.38* 07/22/2013 0425   CALCIUM 8.4 07/22/2013 0425   PROT 5.3* 07/22/2013 0425   ALBUMIN 1.6* 07/22/2013 0425   AST 55* 07/22/2013 0425   ALT 30 07/22/2013 0425   ALKPHOS 199* 07/22/2013 0425   BILITOT 0.3 07/22/2013 0425   GFRNONAA >90 07/22/2013 0425   GFRAA >90 07/22/2013 0425   Lipase     Component Value Date/Time   LIPASE 11 07/09/2013 1415       Studies/Results: No results  found.  Anti-infectives: Anti-infectives   Start     Dose/Rate Route Frequency Ordered Stop   07/10/13 1700  ertapenem (INVANZ) 1 g in sodium chloride 0.9 % 50 mL IVPB     1 g 100 mL/hr over 30 Minutes Intravenous Every 24 hours 07/09/13 1659     07/09/13 1645  [MAR Hold]  ertapenem (INVANZ) 1 g in sodium chloride 0.9 % 50 mL IVPB     (On MAR Hold since 07/09/13 1741)   1 g 100 mL/hr over 30 Minutes Intravenous  Once 07/09/13 1626 07/09/13 1751       Assessment/Plan Perforated colon POD #15 s/p EXPLORATORY LAPAROTOMY, COLOSTOMY, SIGMOID COLECTOMY, HARTMANS PROCEDURE   Plan:  -Tolerating soft diet -Ambulate & IS, try stairs today -SCD's and lovenox  -Continue WD dressing changes - home health ordered  -TPN off -Last CT was on 07/18/13 - Plan for repeat CT today with IR to see if drains could potentially come out prior to d/c today -WOC notes ready for d/c -Talked with infectious disease about enterococcus.  They recommended Augmentin at discharge for 2 weeks.  However, the patient supposedly is allergic to th is.  When questioned she says she got hypotensive and lightheaded after penicillin injection when she was 13-13 y/o.  She was sick/weak/lightheaded that day before shot was given, and later found to  have mono.  She developed a rash and thus it was inconclusive whether she really had a true allergy, but has not taken penicillin meds since.  Talked with pharmacy and will administer Augmentin here and monitor closely for signs of reaction.  The patient is agreeable to this attempt.  Due to this possible allergic reaction we recommend we keep her here for another day to ensure she does not have an allergic reaction at home while trailing Augmentin.    Disp: hopefully tomorrow if tolerates augmentin and CT is okay with [facility] services for dressing changes and ostomy care     LOS: 15 days    Coralie Keens 07/24/2013, 7:51 AM Pager: [phone]

## 2013-07-24 NOTE — Progress Notes (Signed)
Seen and agree  

## 2013-07-25 ENCOUNTER — Encounter (HOSPITAL_COMMUNITY): Payer: Self-pay | Admitting: General Surgery

## 2013-07-25 DIAGNOSIS — K5732 Diverticulitis of large intestine without perforation or abscess without bleeding: Secondary | ICD-10-CM

## 2013-07-25 DIAGNOSIS — K631 Perforation of intestine (nontraumatic): Secondary | ICD-10-CM | POA: Insufficient documentation

## 2013-07-25 DIAGNOSIS — K9189 Other postprocedural complications and disorders of digestive system: Secondary | ICD-10-CM

## 2013-07-25 DIAGNOSIS — K651 Peritoneal abscess: Secondary | ICD-10-CM | POA: Diagnosis not present

## 2013-07-25 DIAGNOSIS — K567 Ileus, unspecified: Secondary | ICD-10-CM | POA: Diagnosis not present

## 2013-07-25 HISTORY — DX: Diverticulitis of large intestine without perforation or abscess without bleeding: K57.32

## 2013-07-25 HISTORY — DX: Perforation of intestine (nontraumatic): K63.1

## 2013-07-25 MED ORDER — DOCUSATE SODIUM 100 MG PO CAPS: 100.0000 mg | ORAL_CAPSULE | Freq: Two times a day (BID) | ORAL | Status: DC | PRN

## 2013-07-25 MED ORDER — HYDROCODONE-ACETAMINOPHEN 5-325 MG PO TABS: 1.0000 | ORAL_TABLET | Freq: Four times a day (QID) | ORAL | Status: DC | PRN

## 2013-07-25 MED ORDER — AMOXICILLIN-POT CLAVULANATE 875-125 MG PO TABS: 1.0000 | ORAL_TABLET | Freq: Two times a day (BID) | ORAL | Status: DC

## 2013-07-25 MED ORDER — POLYETHYLENE GLYCOL 3350 17 GM/SCOOP PO POWD: 8.5000 g | Freq: Two times a day (BID) | ORAL | Status: DC

## 2013-07-25 NOTE — Discharge Summary (Signed)
Physician Discharge Summary  Patient ID: Kristen Reyes MRN: [id number] DOB/AGE: 1951/01/08 63 y.o.  Admit date: 07/09/2013 Discharge date: 07/25/2013  Admitting Diagnosis: Perforated colon Free air  Discharge Diagnosis Patient Active Problem List   Diagnosis Date Noted  . Perforated sigmoid colon 07/25/2013  . Intra-abdominal abscess 07/25/2013  . Ileus, postoperative 07/25/2013  . Sigmoid diverticulitis 07/25/2013    Consultants Interventional Radiology (Dr. Laurence Ferrari)  Imaging: Ct Abdomen Pelvis W Contrast  07/24/2013   CLINICAL DATA:  Evaluate pelvic fluid collection. Status post drain placement.  EXAM: CT ABDOMEN AND PELVIS WITH CONTRAST  TECHNIQUE: Multidetector CT imaging of the abdomen and pelvis was performed using the standard protocol following bolus administration of intravenous contrast.  CONTRAST:  51mL OMNIPAQUE IOHEXOL 300 MG/ML  SOLN  COMPARISON:  07/17/2013  FINDINGS: Since the prior study, a pigtail catheter has been placed to the ischiorectal fossa, mostly evacuating the posterior pelvic abscess. Another pigtail catheter has been reduced from an anterior approach in the right lower quadrant, decompressing right anterior pelvic collection. Small residual loculated collections are noted mostly in the posterior pelvis. There is surrounding inflammatory stranding. Edema surrounds the rectum and residual distal sigmoid colon.  There is no new abscess.  Left lower quadrant colostomy is stable. No evidence of bowel obstruction.  There are loops of small bowel that show evidence of wall thickening. This is most evident in the left mid to lower abdomen posteriorly. There are small bowel o'clock air-fluid levels with a mild diffuse adynamic ileus.  A small amount of ascites noted, decreased from the prior exam.  Small pleural effusions are noted associated with dependent atelectasis. Mild stable scarring in the right middle lobe. Lung bases are otherwise clear.  Liver, spleen,  gallbladder, pancreas, adrenal glands, kidneys, ureters, bladder: Unremarkable.  And intermediate attenuation soft tissue mass projects within the endometrial canal, not evident on the prior exam. This may reflect a submucosal fibroid. It could be a polyp.  There are mildly prominent retroperitoneal lymph nodes. There are no pathologically enlarged lymph nodes.  No significant bony abnormality.  IMPRESSION: 1. There has been significant improvement following placement of drainage catheters in to loculated pelvic abscesses. Abscess collections are significantly smaller. There is no new abscess. 2. Small amount of ascites, decreased from the prior study. 3. There are loops of thick-walled small bowel in the left mid to lower abdomen. This is nonspecific. It could be reactive to adjacent inflammation, favored. It could reflect ischemia. 4. Small bowel and colonic air-fluid levels consistent with mild diffuse adynamic ileus. No obstruction. 5. Small mass in the uterus projects into the endometrium. Measures 14 mm in size. It may reflect a submucosal fibroid. An endometrial polyp is possible. Followup hysteroscopy or hysterosonography is recommended on a nonemergent basis 6. Small pleural effusions with associated dependent atelectasis. Pleural effusions have also decreased from the prior study.   Electronically Signed   By: Lajean Manes M.D.   On: 07/24/2013 11:03    Procedures Dr. Excell Seltzer (07/09/13) - EXPLORATORY LAPAROTOMY, COLOSTOMY, SIGMOID COLECTOMY, HARTMANS PROCEDURE   Hospital Course:  Healthy 63 y/o white female who describes developing pain and bloating about 12 days ago. She saw her PCP ON 5/4 and was treated for constipation. She had several doses of laxatives with some good results, but she continued to have pain and bloating. Fever on 5/10 up to 102.2. She was seen in [facility] and CT scan showed: Moderate to severe fecal retention throughout the colon. Mild wall thickening with minimal adjacent  inflammatory change involving the rectosigmoid colon. Mild free fluid in the pelvis and pericolic gutters. Findings are likely due to an acute colitis which may be of infectious or inflammatory etiology (later found out to be diverticulitis).   She was treated with Flagyl and Cipro, along with some hydrocodone for pain and sent home from the ED. She said she would feel better with the pain medicine, but never really improved. She noted increased bloating, she has had some nausea and vomiting today. She missed a dose of pain medicine and developed fever again. She returned to the [facility] as instructed. Plain film shows: Free extraluminal gas is consistent with a perforated viscus. This may be related to the suspected acute colitis. WBC is 13.2 with left shift, lactic acid is 2.83.  Patient was admitted and underwent procedure listed above.  Tolerated procedure well and was transferred to the floor.  She experienced a post-operative ileus.  Was started on clears, and slowly diet was advanced as tolerated.  She continued to have leukocytosis thus a CT was obtained which showed intra-abdominal abscesses.  IR was asked to place 2 drains which was done successfully.  A PICC was placed and TNA started.  NG was placed and made NPO.  She was maintained on Invanz for her intraabdominal abscess.  Her ileus remained for a few days.  Clamping trials were re-attempted on 07/20/13 and her NG was discontinued on 07/21/13.  Luckily her TNA was able to be weaned and she was started on a soft diet prior to discharge.  A repeat CT showed significant improvement in fluid collections and thus her drains were discontinued.  Her cultures grew enterococcus which was sensitive to ampicillin.  She had a questionable allergy to Penicillins, thus she was kept for an additional 24 hours to ensure she did not have a serious allergic reaction once started on PO Augmentin.  She received 3 doses prior to discharge and had no sensitivities or  reactions.  On POD#16, the patient was voiding well, tolerating diet, ambulating well, pain well controlled, vital signs stable, incisions c/d/i and felt stable for discharge home with her husband.  Patient will follow up in our office in 2-3 weeks and knows to call with questions or concerns.  She will have [facility] for ostomy, and wound management.  Physical Exam: General:  Alert, NAD, pleasant, comfortable Abd: Soft, minimally tender at midline wound, ND, +BS, no HSM, midline wound clean with good beefy red granulation tissue, drains removed  Surgical Pathology: Diagnosis Colon, segmental resection, sigmoid - DIVERTICULITIS WITH PERFORATION, PERICOLONIC ABSCESS AND ACUTE SEROSITIS. - NO EVIDENCE OF MALIGNANCY     Medication List    STOP taking these medications       ciprofloxacin 500 MG tablet  Commonly known as:  CIPRO     metroNIDAZOLE 500 MG tablet  Commonly known as:  FLAGYL      TAKE these medications       acetaminophen 500 MG tablet  Commonly known as:  TYLENOL  Take 1,000 mg by mouth every 6 (six) hours as needed for mild pain.     ALPRAZolam 0.5 MG tablet  Commonly known as:  XANAX  Take 0.125-0.25 mg by mouth at bedtime as needed for anxiety.     amoxicillin-clavulanate 875-125 MG per tablet  Commonly known as:  AUGMENTIN  Take 1 tablet by mouth every 12 (twelve) hours.     aspirin EC 81 MG tablet  Take 81 mg by mouth daily.  citalopram 10 MG tablet  Commonly known as:  CELEXA  Take 5 mg by mouth every other day.     docusate sodium 100 MG capsule  Commonly known as:  COLACE  Take 1 capsule (100 mg total) by mouth 2 (two) times daily as needed for mild constipation.     HYDROcodone-acetaminophen 5-325 MG per tablet  Commonly known as:  NORCO  Take 1-2 tablets by mouth every 6 (six) hours as needed for moderate pain or severe pain.     ibuprofen 200 MG tablet  Commonly known as:  ADVIL,MOTRIN  Take 400 mg by mouth every 6 (six) hours as needed for  fever or moderate pain.     metoprolol succinate 25 MG 24 hr tablet  Commonly known as:  TOPROL-XL  Take 37.5 mg by mouth daily.     multivitamin with minerals Tabs tablet  Take 1 tablet by mouth daily.     polyethylene glycol powder powder  Commonly known as:  MIRALAX  Take 8.5-34 g by mouth 2 (two) times daily. To correct constipation.  Adjust dose over 1-2 months.  Goal = ~1 bowel movement / day     zolpidem 10 MG tablet  Commonly known as:  AMBIEN  Take 3.5 mg by mouth at bedtime as needed for sleep.         Follow-up Information   Follow up with HOXWORTH,BENJAMIN T, MD. Schedule an appointment as soon as possible for a visit in 2 weeks. (For post-operation check)    Specialty:  General Surgery   Contact information:   [place] [address] [phone]       Follow up with Pcp Not In System. Schedule an appointment as soon as possible for a visit in 2 weeks. (For post-hospital follow up with a primary care provider ASAP)       Signed: Coralie Keens [facility] Surgery [phone]  07/25/2013, 2:48 PM

## 2013-07-25 NOTE — Progress Notes (Signed)
Agree.  Drain removal uncomplicated.

## 2013-07-25 NOTE — Progress Notes (Signed)
16 Days Post-Op  Subjective: Pt doing ok; still has some intermittent pelvic discomfort; denies N/V  Objective: Vital signs in last 24 hours: Temp:  [98.1 F (36.7 C)-98.7 F (37.1 C)] 98.6 F (37 C) (05/29 0527) Pulse Rate:  [102-126] 102 (05/29 0527) Resp:  [16-19] 16 (05/29 0527) BP: (89-110)/(58-69) 108/69 mmHg (05/29 0527) SpO2:  [96 %-98 %] 97 % (05/29 0527) Last BM Date: 07/23/13  Intake/Output from previous day: 05/28 0701 - 05/29 0700 In: 1140 [P.O.:270; I.V.:810; IV Piggyback:50] Out: 2000 [Urine:1850; Stool:150] Intake/Output this shift:   TG drain with minimal output; insertion site ok, mildly tender  Lab Results:  No results found for this basename: WBC, HGB, HCT, PLT,  in the last 72 hours BMET No results found for this basename: NA, K, CL, CO2, GLUCOSE, BUN, CREATININE, CALCIUM,  in the last 72 hours PT/INR No results found for this basename: LABPROT, INR,  in the last 72 hours ABG No results found for this basename: PHART, PCO2, PO2, HCO3,  in the last 72 hours  Studies/Results: Ct Abdomen Pelvis W Contrast  07/24/2013   CLINICAL DATA:  Evaluate pelvic fluid collection. Status post drain placement.  EXAM: CT ABDOMEN AND PELVIS WITH CONTRAST  TECHNIQUE: Multidetector CT imaging of the abdomen and pelvis was performed using the standard protocol following bolus administration of intravenous contrast.  CONTRAST:  16mL OMNIPAQUE IOHEXOL 300 MG/ML  SOLN  COMPARISON:  07/17/2013  FINDINGS: Since the prior study, a pigtail catheter has been placed to the ischiorectal fossa, mostly evacuating the posterior pelvic abscess. Another pigtail catheter has been reduced from an anterior approach in the right lower quadrant, decompressing right anterior pelvic collection. Small residual loculated collections are noted mostly in the posterior pelvis. There is surrounding inflammatory stranding. Edema surrounds the rectum and residual distal sigmoid colon.  There is no new abscess.   Left lower quadrant colostomy is stable. No evidence of bowel obstruction.  There are loops of small bowel that show evidence of wall thickening. This is most evident in the left mid to lower abdomen posteriorly. There are small bowel o'clock air-fluid levels with a mild diffuse adynamic ileus.  A small amount of ascites noted, decreased from the prior exam.  Small pleural effusions are noted associated with dependent atelectasis. Mild stable scarring in the right middle lobe. Lung bases are otherwise clear.  Liver, spleen, gallbladder, pancreas, adrenal glands, kidneys, ureters, bladder: Unremarkable.  And intermediate attenuation soft tissue mass projects within the endometrial canal, not evident on the prior exam. This may reflect a submucosal fibroid. It could be a polyp.  There are mildly prominent retroperitoneal lymph nodes. There are no pathologically enlarged lymph nodes.  No significant bony abnormality.  IMPRESSION: 1. There has been significant improvement following placement of drainage catheters in to loculated pelvic abscesses. Abscess collections are significantly smaller. There is no new abscess. 2. Small amount of ascites, decreased from the prior study. 3. There are loops of thick-walled small bowel in the left mid to lower abdomen. This is nonspecific. It could be reactive to adjacent inflammation, favored. It could reflect ischemia. 4. Small bowel and colonic air-fluid levels consistent with mild diffuse adynamic ileus. No obstruction. 5. Small mass in the uterus projects into the endometrium. Measures 14 mm in size. It may reflect a submucosal fibroid. An endometrial polyp is possible. Followup hysteroscopy or hysterosonography is recommended on a nonemergent basis 6. Small pleural effusions with associated dependent atelectasis. Pleural effusions have also decreased from the prior study.  Electronically Signed   By: Lajean Manes M.D.   On: 07/24/2013 11:03     Anti-infectives: Anti-infectives   Start     Dose/Rate Route Frequency Ordered Stop   07/24/13 1100  amoxicillin-clavulanate (AUGMENTIN) 875-125 MG per tablet 1 tablet     1 tablet Oral Every 12 hours 07/24/13 1046 08/03/13 0959   07/10/13 1700  ertapenem (INVANZ) 1 g in sodium chloride 0.9 % 50 mL IVPB  Status:  Discontinued     1 g 100 mL/hr over 30 Minutes Intravenous Every 24 hours 07/09/13 1659 07/24/13 1046   07/09/13 1645  [MAR Hold]  ertapenem (INVANZ) 1 g in sodium chloride 0.9 % 50 mL IVPB     (On MAR Hold since 07/09/13 1741)   1 g 100 mL/hr over 30 Minutes Intravenous  Once 07/09/13 1626 07/09/13 1751      Assessment/Plan: s/p pelvic abscess drainage 5/22; recent CT reviewed and showed near resolution of lower pelvic abscesses. Right TG drain with minimal output. Following d/w CCS decision made to remove TG drain- drain removed in its entirety without immediate complications. Gauze dressing applied to site. Further plans per CCS.  LOS: 16 days    [name] 07/25/2013

## 2013-07-25 NOTE — Discharge Instructions (Signed)
[facility]      [facility] °[phone] ° °OPEN ABDOMINAL SURGERY: POST OP INSTRUCTIONS ° °Always review your discharge instruction sheet given to you by the facility where your surgery was performed. ° °IF YOU HAVE DISABILITY OR FAMILY LEAVE FORMS, YOU MUST BRING THEM TO THE OFFICE FOR PROCESSING.  PLEASE DO NOT GIVE THEM TO YOUR DOCTOR. ° °1. A prescription for pain medication may be given to you upon discharge.  Take your pain medication as prescribed, if needed.  If narcotic pain medicine is not needed, then you may take acetaminophen (Tylenol) or ibuprofen (Advil) as needed. °2. Take your usually prescribed medications unless otherwise directed. °3. If you need a refill on your pain medication, please contact your pharmacy. They will contact our office to request authorization.  Prescriptions will not be filled after 5pm or on week-ends. °4. You should follow a light diet the first few days after arrival home, such as soup and crackers, pudding, etc.unless your doctor has advised otherwise. A high-fiber, low fat diet can be resumed as tolerated.   Be sure to include lots of fluids daily. Most patients will experience some swelling and bruising on the chest and neck area.  Ice packs will help.  Swelling and bruising can take several days to resolve °5. Most patients will experience some swelling and bruising in the area of the incision. Ice pack will help. Swelling and bruising can take several days to resolve..  °6. It is common to experience some constipation if taking pain medication after surgery.  Increasing fluid intake and taking a stool softener will usually help or prevent this problem from occurring.  A mild laxative (Milk of Magnesia or Miralax) should be taken according to package directions if there are no bowel movements after 48 hours. °7.  You may have steri-strips (small skin tapes) in place directly over the incision.  These strips should be left on the skin for 7-10 days.  If your  surgeon used skin glue on the incision, you may shower in 24 hours.  The glue will flake off over the next 2-3 weeks.  Any sutures or staples will be removed at the office during your follow-up visit. You may find that a light gauze bandage over your incision may keep your staples from being rubbed or pulled. You may shower and replace the bandage daily. °8. ACTIVITIES:  You may resume regular (light) daily activities beginning the next day--such as daily self-care, walking, climbing stairs--gradually increasing activities as tolerated.  You may have sexual intercourse when it is comfortable.  Refrain from any heavy lifting or straining until approved by your doctor. °a. You may drive when you no longer are taking prescription pain medication, you can comfortably wear a seatbelt, and you can safely maneuver your car and apply brakes °b. Return to Work: ___________________________________ °9. You should see your doctor in the office for a follow-up appointment approximately two weeks after your surgery.  Make sure that you call for this appointment within a day or two after you arrive home to insure a convenient appointment time. °OTHER INSTRUCTIONS:  °_____________________________________________________________ °_____________________________________________________________ ° °WHEN TO CALL YOUR DOCTOR: °1. Fever over 101.0 °2. Inability to urinate °3. Nausea and/or vomiting °4. Extreme swelling or bruising °5. Continued bleeding from incision. °6. Increased pain, redness, or drainage from the incision. °7. Difficulty swallowing or breathing °8. Muscle cramping or spasms. °9. Numbness or tingling in hands or feet or around lips. ° °The clinic staff is available to   answer your questions during regular business hours.  Please don’t hesitate to call and ask to speak to one of the nurses if you have concerns. ° °For further questions, please visit [email or web address] ° °Colostomy Home Guide °A colostomy is an  opening for stool to leave your body when a medical condition prevents it from leaving through the usual opening (rectum). During a surgery, a piece of large intestine (colon) is brought through a hole in the abdominal wall. The new opening is called a stoma or ostomy. A bag or pouch fits over the stoma to catch stool and gas. Your stool may be liquid, somewhat pasty, or formed. °CARING FOR YOUR STOMA  °Normally, the stoma looks a lot like the inside of your cheek: pink, red, and moist. At first it may be swollen, but this swelling will decrease within 6 weeks. °Keep the skin around your stoma clean and dry. You can gently wash your stoma and the skin around your stoma in the shower with a clean, soft washcloth. If you develop any skin irritation, your caregiver may give you a stoma powder or ointment to help heal the area. Do not use any products other than those specifically given to you by your caregiver.  °Your stoma should not be uncomfortable. If you notice any stinging or burning, your pouch may be leaking, and the skin around your stoma may be coming into contact with stool. This can cause skin irritation. If you notice stinging, replace your pouch with a new one and discard the old one. °OSTOMY POUCHES  °The pouch that fits over the ostomy can be made up of either 1 or 2 pieces. A one-piece pouch has a skin barrier piece and the pouch itself in one unit. A two-piece pouch has a skin barrier with a separate pouch that snaps on and off of the skin barrier. Either way, you should empty the pouch when it is only  to ½ full. Do not let more stool or gas build up. This could cause the pouch to leak. °Some ostomy bags have a built-in gas release valve. Ostomy deodorizer (5 drops) can be put into the pouch to prevent odor. Some people use ostomy lubricant drops inside the pouch to help the stool slide out of the bag more easily and completely.  °EMPTYING YOUR OSTOMY POUCH  °You may get lessons on how to empty your  pouch from a wound-ostomy nurse before you leave the hospital. Here are the basic steps: °· Wash your hands with soap and water. °· Sit far back on the toilet. °· Put several pieces of toilet paper into the toilet water. This will prevent splashing as you empty the stool into the toilet bowl. °· Unclip or unvelcro the tail end of the pouch. °· Unroll the tail and empty stool into the toilet. °· Clean the tail with toilet paper. °· Reroll the tail, and clip or velcro it closed. °· Wash your hands again. °CHANGING YOUR OSTOMY POUCH  °Change your ostomy pouch about every 3 to 4 days for the first 6 weeks, then every 5 to7 days. Always change the bag sooner if there is any leakage or you begin to notice any discomfort or irritation of the skin around the stoma. When possible, plan to change your ostomy pouch before eating or drinking as this will lessen the chance of stool coming out during the pouch change. A wound-ostomy nurse may teach you how to change your pouch before you leave the hospital. Here are   the basic steps:  [place] out your supplies.  Wash your hands with soap and water.  Carefully remove the old pouch.  Wash the stoma and allow it to dry. Men may be advised to shave any hair around the stoma very carefully. This will make the adhesive stick better.  Use the stoma measuring guide that comes with your pouch set to decide what size hole you will need to cut in the skin barrier piece. Choose the smallest possible size that will hold the stoma but will not touch it.  Use the guide to trace the circle on the back of the skin barrier piece. Cut out the hole.  Hold the skin barrier piece over the stoma to make sure the hole is the correct size.  Remove the adhesive paper backing from the skin barrier piece.  Squeeze stoma paste around the opening of the skin barrier piece.  Clean and dry the skin around the stoma again.  Carefully fit the skin barrier piece over your stoma.  If you are  using a two-piece pouch, snap the pouch onto the skin barrier piece.  Close the tail of the pouch.  Put your hand over the top of the skin barrier piece to help warm it for about 5 minutes, so that it conforms to your body better.  Wash your hands again. DIET TIPS   Continue to follow your usual diet.  Drink about eight 8 oz glasses of water each day.  You can prevent gas by eating slowly and chewing your food thoroughly.  If you feel concerned that you have too much gas, you can cut back on gas-producing foods, such as:  Spicy foods.  Onions and garlic.  Cruciferous vegetables (cabbage, broccoli, cauliflower, Brussels sprouts).  Beans and legumes.  Some cheeses.  Eggs.  Fish.  Bubbly (carbonated) drinks.  Chewing gum. GENERAL TIPS   You can shower with or without the bag in place.  Always keep the bag on if you are bathing or swimming.  If your bag gets wet, you can dry it with a blow-dryer set to cool.  Avoid wearing tight clothing directly over your stoma so that it does not become irritated or bleed. Tight clothing can also prevent stool from draining into the pouch.  It is helpful to always have an extra skin barrier and pouch with you when traveling. Do not leave them anywhere too warm, as parts of them can melt.  Do not let your seat belt rest on your stoma. Try to keep the seat belt either above or below your stoma, or use a tiny pillow to cushion it.  You can still participate in sports, but you should avoid activities in which there is a risk of getting hit in the abdomen.  You can still have sex. It is a good idea to empty your pouch prior to sex. Some people and their partners feel very comfortable seeing the pouch during sex. Others choose to wear lingerie or a T-shirt that covers the device. SEEK IMMEDIATE MEDICAL CARE IF:  You notice a change in the size or color of the stoma, especially if it becomes very red, purple, black, or pale white.  You  have bloody stools or bleeding from the stoma.  You have abdominal pain, nausea, vomiting, or bloating.  There is anything unusual protruding from the stoma.  You have irritation or red skin around the stoma.  No stool is passing from the stoma.  You have diarrhea (requiring more frequent  than normal pouch emptying). Document Released: 02/16/2003 Document Revised: 05/08/2011 Document Reviewed: 07/13/2010 [facility] Patient Information 2014 [place], [place].

## 2013-08-07 ENCOUNTER — Ambulatory Visit (INDEPENDENT_AMBULATORY_CARE_PROVIDER_SITE_OTHER): Payer: Managed Care, Other (non HMO) | Admitting: General Surgery

## 2013-08-07 ENCOUNTER — Encounter (INDEPENDENT_AMBULATORY_CARE_PROVIDER_SITE_OTHER): Payer: Self-pay | Admitting: General Surgery

## 2013-08-07 VITALS — BP 100/70 | HR 63 | Temp 97.9°F | Resp 12 | Ht 63.25 in | Wt 97.2 lb

## 2013-08-07 DIAGNOSIS — Z09 Encounter for follow-up examination after completed treatment for conditions other than malignant neoplasm: Secondary | ICD-10-CM

## 2013-08-07 NOTE — Progress Notes (Signed)
History: Patient returns to the office for her first postoperative visit approximately one month following emergency Hartmann colectomy for perforated diverticulitis with fecal peritonitis. Her postoperative course was complicated by a couple of intra-abdominal fluid collections requiring percutaneous drainage which resolved while she was in the hospital and her drains were removed prior to discharge. Since being home she feels she is making some slow steady progress. She has some expected amount of incisional lower abdominal discomfort that is improving. Colostomy functioning well. She is eating without difficulty. No fever. Her husband is caring for the wound.  Exam: BP 100/70  Pulse 63  Temp(Src) 97.9 F (36.6 C)  Resp 12  Ht 5' 3.25" (1.607 m)  Wt 97 lb 3.2 oz (44.09 kg)  BMI 17.07 kg/m2 General: Very thin but in no distress Abdomen: Soft and nontender. Midline wound with healthy granulation tissue. The stoma healthy.  Physical plan: Doing well following above procedure. Continue to increase activity and diet as tolerated. Continue daily wound dressing changes. Return in one month.

## 2013-08-13 ENCOUNTER — Telehealth (INDEPENDENT_AMBULATORY_CARE_PROVIDER_SITE_OTHER): Payer: Self-pay

## 2013-08-13 NOTE — Telephone Encounter (Signed)
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 [facility] nurse calling requesting authorization for one more visit for teaching purposes.  Pt is doing well.  Authorized one more home visit. Dr. Excell Seltzer to be made aware.

## 2013-08-20 ENCOUNTER — Telehealth (INDEPENDENT_AMBULATORY_CARE_PROVIDER_SITE_OTHER): Payer: Self-pay

## 2013-08-20 NOTE — Telephone Encounter (Signed)
Message copied by Ivor Costa on Wed Aug 20, 2013  1:01 PM ------      Message from: [place], [place]: Fri Aug 15, 2013  3:22 PM      Contact: [phone]       She needs a po appt for around July 9 but he dont have anything so please call her ------

## 2013-08-20 NOTE — Telephone Encounter (Signed)
Called patient with post op appointment for 09/04/13 @ 11:15 am w/Dr. Excell Seltzer

## 2013-09-04 ENCOUNTER — Encounter (INDEPENDENT_AMBULATORY_CARE_PROVIDER_SITE_OTHER): Payer: Self-pay | Admitting: General Surgery

## 2013-09-04 ENCOUNTER — Ambulatory Visit (INDEPENDENT_AMBULATORY_CARE_PROVIDER_SITE_OTHER): Payer: Managed Care, Other (non HMO) | Admitting: General Surgery

## 2013-09-04 VITALS — BP 115/61 | HR 61 | Temp 98.3°F | Resp 18 | Ht 63.0 in | Wt 96.8 lb

## 2013-09-04 DIAGNOSIS — Z09 Encounter for follow-up examination after completed treatment for conditions other than malignant neoplasm: Secondary | ICD-10-CM

## 2013-09-04 NOTE — Progress Notes (Signed)
History: Patient returns to the office now 2 months following emergency Hartmann colectomy for perforated diverticulitis with fecal peritonitis. She had postoperative fluid collections percutaneously drained. She continues to improve. No significant abdominal pain. Gaining weight and energy improving. She is ready to back to work next week.  Exam: BP 115/61  Pulse 61  Temp(Src) 98.3 F (36.8 C) (Temporal)  Resp 18  Ht 5\' 3"  (1.6 m)  Wt 96 lb 12.8 oz (43.908 kg)  BMI 17.15 kg/m2 General: Thin but well appearing Abdomen: Soft and nontender. Stoma healthy. Midline wound essentially completely healed. A couple of Novafil sutures that were poking through the skin were removed.  Assessment plan: Doing well following above procedure. Okay to return to work with half days for 2 weeks. Return in 3 months.

## 2013-12-03 ENCOUNTER — Encounter (INDEPENDENT_AMBULATORY_CARE_PROVIDER_SITE_OTHER): Payer: Managed Care, Other (non HMO) | Admitting: General Surgery

## 2013-12-03 ENCOUNTER — Other Ambulatory Visit (INDEPENDENT_AMBULATORY_CARE_PROVIDER_SITE_OTHER): Payer: Self-pay

## 2013-12-09 ENCOUNTER — Other Ambulatory Visit (INDEPENDENT_AMBULATORY_CARE_PROVIDER_SITE_OTHER): Payer: Self-pay

## 2013-12-09 DIAGNOSIS — IMO0002 Reserved for concepts with insufficient information to code with codable children: Secondary | ICD-10-CM

## 2013-12-16 ENCOUNTER — Ambulatory Visit
Admission: RE | Admit: 2013-12-16 | Discharge: 2013-12-16 | Disposition: A | Discharge status: Home / Self Care | Payer: Managed Care, Other (non HMO) | Source: Ambulatory Visit | Attending: General Surgery | Admitting: General Surgery

## 2013-12-16 DIAGNOSIS — IMO0002 Reserved for concepts with insufficient information to code with codable children: Secondary | ICD-10-CM

## 2014-02-26 ENCOUNTER — Ambulatory Visit
Admission: RE | Admit: 2014-02-26 | Discharge: 2014-02-26 | Disposition: A | Discharge status: Home / Self Care | Payer: Managed Care, Other (non HMO) | Source: Ambulatory Visit | Attending: Family Medicine | Admitting: Family Medicine

## 2014-02-26 ENCOUNTER — Other Ambulatory Visit: Payer: Self-pay | Admitting: Family Medicine

## 2014-02-26 DIAGNOSIS — M533 Sacrococcygeal disorders, not elsewhere classified: Secondary | ICD-10-CM

## 2014-04-23 ENCOUNTER — Other Ambulatory Visit (INDEPENDENT_AMBULATORY_CARE_PROVIDER_SITE_OTHER): Payer: Self-pay | Admitting: General Surgery

## 2014-05-13 NOTE — Progress Notes (Signed)
OFFICE NOTE 12/15 -EKG 2014 - ECHO 2011 - ON CHART

## 2014-05-13 NOTE — Patient Instructions (Addendum)
KATTLEYA KUHNERT  05/13/2014   Your procedure is scheduled on: 05/19/14   Report to [facility] Main  Entrance and follow signs to               [place] at 8:30 AM.   Call this number if you have problems the morning of surgery [phone]   Remember:  Do not eat food or drink liquids :After Midnight.     Take these medicines the morning of surgery with A SIP OF WATER: METOPROLOL              STOP ASPIRIN / VITAMINS / HERBAL MEDS 7 DAYS PREOP                                You may not have any metal on your body including hair pins and              piercings  Do not wear jewelry, make-up, lotions, powders or perfumes.             Do not wear nail polish.  Do not shave  48 hours prior to surgery.              Men may shave face and neck.   Do not bring valuables to the hospital. [place].  Contacts, dentures or bridgework may not be worn into surgery.  Leave suitcase in the car. After surgery it may be brought to your room.     Patients discharged the day of surgery will not be allowed to drive home.  Name and phone number of your driver:  Special Instructions: N/A              Please read over the following fact sheets you were given: _____________________________________________________________________                                                     [place]  Before surgery, you can play an important role.  Because skin is not sterile, your skin needs to be as free of germs as possible.  You can reduce the number of germs on your skin by washing with CHG (chlorahexidine gluconate) soap before surgery.  CHG is an antiseptic cleaner which kills germs and bonds with the skin to continue killing germs even after washing. Please DO NOT use if you have an allergy to CHG or antibacterial soaps.  If your skin becomes reddened/irritated stop using the CHG and inform  your nurse when you arrive at Short Stay. Do not shave (including legs and underarms) for at least 48 hours prior to the first CHG shower.  You may shave your face. Please follow these instructions carefully:   1.  Shower with CHG Soap the night before surgery and the  morning of Surgery.   2.  If you choose to wash your hair, wash your hair first as usual with your  normal  Shampoo.   3.  After you shampoo, rinse your hair and body thoroughly to remove the  shampoo.  4.  Use CHG as you would any other liquid soap.  You can apply chg directly  to the skin and wash . Gently wash with scrungie or clean wascloth    5.  Apply the CHG Soap to your body ONLY FROM THE NECK DOWN.   Do not use on open                           Wound or open sores. Avoid contact with eyes, ears mouth and genitals (private parts).                        Genitals (private parts) with your normal soap.              6.  Wash thoroughly, paying special attention to the area where your surgery  will be performed.   7.  Thoroughly rinse your body with warm water from the neck down.   8.  DO NOT shower/wash with your normal soap after using and rinsing off  the CHG Soap .                9.  Pat yourself dry with a clean towel.             10.  Wear clean pajamas.             11.  Place clean sheets on your bed the night of your first shower and do not  sleep with pets.  Day of Surgery : Do not apply any lotions/deodorants the morning of surgery.  Please wear clean clothes to the hospital/surgery center.  FAILURE TO FOLLOW THESE INSTRUCTIONS MAY RESULT IN THE CANCELLATION OF YOUR SURGERY    PATIENT SIGNATURE_________________________________  ______________________________________________________________________

## 2014-05-14 ENCOUNTER — Encounter (HOSPITAL_COMMUNITY): Payer: Self-pay

## 2014-05-14 ENCOUNTER — Encounter (HOSPITAL_COMMUNITY)
Admission: RE | Admit: 2014-05-14 | Discharge: 2014-05-14 | Disposition: A | Discharge status: Home / Self Care | Payer: Managed Care, Other (non HMO) | Source: Ambulatory Visit | Attending: General Surgery | Admitting: General Surgery

## 2014-05-14 DIAGNOSIS — Z01812 Encounter for preprocedural laboratory examination: Secondary | ICD-10-CM | POA: Diagnosis present

## 2014-05-14 DIAGNOSIS — Z01810 Encounter for preprocedural cardiovascular examination: Secondary | ICD-10-CM | POA: Diagnosis not present

## 2014-05-14 HISTORY — DX: Nausea with vomiting, unspecified: R11.2

## 2014-05-14 HISTORY — DX: Diverticulitis of intestine, part unspecified, without perforation or abscess without bleeding: K57.92

## 2014-05-14 HISTORY — DX: Adverse effect of unspecified anesthetic, initial encounter: T41.45XA

## 2014-05-14 HISTORY — DX: Other specified postprocedural states: Z98.890

## 2014-05-14 HISTORY — DX: Personal history of other malignant neoplasm of skin: Z85.828

## 2014-05-14 HISTORY — DX: Colostomy status: Z93.3

## 2014-05-14 HISTORY — DX: Other complications of anesthesia, initial encounter: T88.59XA

## 2014-05-14 HISTORY — DX: Sleep disorder, unspecified: G47.9

## 2014-05-14 HISTORY — DX: Age-related osteoporosis without current pathological fracture: M81.0

## 2014-05-14 HISTORY — DX: Malignant (primary) neoplasm, unspecified: C80.1

## 2014-05-14 LAB — CBC
HEMATOCRIT: 39.6 % (ref 36.0–46.0)
Hemoglobin: 13.1 g/dL (ref 12.0–15.0)
MCH: 29.3 pg (ref 26.0–34.0)
MCHC: 33.1 g/dL (ref 30.0–36.0)
MCV: 88.6 fL (ref 78.0–100.0)
Platelets: 216 10*3/uL (ref 150–400)
RBC: 4.47 MIL/uL (ref 3.87–5.11)
RDW: 12.5 % (ref 11.5–15.5)
WBC: 2.8 10*3/uL — ABNORMAL LOW (ref 4.0–10.5)

## 2014-05-14 LAB — BASIC METABOLIC PANEL
Anion gap: 8 (ref 5–15)
BUN: 16 mg/dL (ref 6–23)
CHLORIDE: 106 mmol/L (ref 96–112)
CO2: 29 mmol/L (ref 19–32)
CREATININE: 0.63 mg/dL (ref 0.50–1.10)
Calcium: 9.6 mg/dL (ref 8.4–10.5)
GFR calc Af Amer: >90 mL/min (ref >90)
GLUCOSE: 102 mg/dL — AB (ref 70–99)
Potassium: 4.3 mmol/L (ref 3.5–5.1)
SODIUM: 143 mmol/L (ref 135–145)

## 2014-05-19 ENCOUNTER — Encounter (HOSPITAL_COMMUNITY): Payer: Self-pay | Admitting: *Deleted

## 2014-05-19 ENCOUNTER — Encounter (HOSPITAL_COMMUNITY)
Admission: RE | Disposition: A | Discharge status: Home Health | Payer: Self-pay | Source: Ambulatory Visit | Attending: General Surgery

## 2014-05-19 ENCOUNTER — Inpatient Hospital Stay (HOSPITAL_COMMUNITY): Payer: Managed Care, Other (non HMO) | Admitting: Anesthesiology

## 2014-05-19 ENCOUNTER — Inpatient Hospital Stay (HOSPITAL_COMMUNITY)
Admission: RE | Admit: 2014-05-19 | Discharge: 2014-05-24 | DRG: 343 | Disposition: A | Discharge status: Home Health | Payer: Managed Care, Other (non HMO) | Source: Ambulatory Visit | Attending: General Surgery | Admitting: General Surgery

## 2014-05-19 DIAGNOSIS — Z7982 Long term (current) use of aspirin: Secondary | ICD-10-CM

## 2014-05-19 DIAGNOSIS — I10 Essential (primary) hypertension: Secondary | ICD-10-CM | POA: Diagnosis present

## 2014-05-19 DIAGNOSIS — Z800 Family history of malignant neoplasm of digestive organs: Secondary | ICD-10-CM

## 2014-05-19 DIAGNOSIS — Z79899 Other long term (current) drug therapy: Secondary | ICD-10-CM

## 2014-05-19 DIAGNOSIS — Z01812 Encounter for preprocedural laboratory examination: Secondary | ICD-10-CM | POA: Diagnosis not present

## 2014-05-19 DIAGNOSIS — Z9049 Acquired absence of other specified parts of digestive tract: Secondary | ICD-10-CM | POA: Diagnosis present

## 2014-05-19 DIAGNOSIS — Z433 Encounter for attention to colostomy: Secondary | ICD-10-CM | POA: Diagnosis present

## 2014-05-19 DIAGNOSIS — K660 Peritoneal adhesions (postprocedural) (postinfection): Secondary | ICD-10-CM | POA: Diagnosis present

## 2014-05-19 DIAGNOSIS — K5732 Diverticulitis of large intestine without perforation or abscess without bleeding: Secondary | ICD-10-CM | POA: Diagnosis present

## 2014-05-19 DIAGNOSIS — F419 Anxiety disorder, unspecified: Secondary | ICD-10-CM | POA: Diagnosis present

## 2014-05-19 DIAGNOSIS — Z933 Colostomy status: Secondary | ICD-10-CM

## 2014-05-19 HISTORY — PX: COLOSTOMY TAKEDOWN: SHX5258

## 2014-05-19 HISTORY — DX: Perforation of intestine (nontraumatic): K63.1

## 2014-05-19 LAB — TYPE AND SCREEN
ABO/RH(D): A POS
Antibody Screen: NEGATIVE

## 2014-05-19 SURGERY — CLOSURE, COLOSTOMY, LAPAROSCOPIC
Anesthesia: General

## 2014-05-19 MED ORDER — LACTATED RINGERS IV SOLN: INTRAVENOUS | Status: DC

## 2014-05-19 MED ORDER — PROPOFOL 10 MG/ML IV BOLUS
INTRAVENOUS | Status: DC | PRN
  Administered 2014-05-19: 90 mg via INTRAVENOUS

## 2014-05-19 MED ORDER — PEG 3350-KCL-NA BICARB-NACL 420 G PO SOLR: 4000.0000 mL | Freq: Once | ORAL | Status: DC

## 2014-05-19 MED ORDER — ONDANSETRON HCL 4 MG/2ML IJ SOLN
INTRAMUSCULAR | Status: DC | PRN
  Administered 2014-05-19: 4 mg via INTRAVENOUS

## 2014-05-19 MED ORDER — MIDAZOLAM HCL 2 MG/2ML IJ SOLN
INTRAMUSCULAR | Status: AC
  Filled 2014-05-19: qty 2

## 2014-05-19 MED ORDER — ALVIMOPAN 12 MG PO CAPS
12.0000 mg | ORAL_CAPSULE | Freq: Once | ORAL | Status: AC
  Administered 2014-05-19: 12 mg via ORAL
  Filled 2014-05-19: qty 1

## 2014-05-19 MED ORDER — 0.9 % SODIUM CHLORIDE (POUR BTL) OPTIME
TOPICAL | Status: DC | PRN
  Administered 2014-05-19: 4000 mL

## 2014-05-19 MED ORDER — HYDROMORPHONE HCL 2 MG/ML IJ SOLN
INTRAMUSCULAR | Status: AC
  Filled 2014-05-19: qty 1

## 2014-05-19 MED ORDER — DEXTROSE 5 % IV SOLN
2.0000 g | INTRAVENOUS | Status: AC
  Administered 2014-05-19: 2 g via INTRAVENOUS
  Filled 2014-05-19: qty 2

## 2014-05-19 MED ORDER — PROMETHAZINE HCL 25 MG/ML IJ SOLN: 6.2500 mg | INTRAMUSCULAR | Status: DC | PRN

## 2014-05-19 MED ORDER — TISSEEL VH 10 ML EX KIT
PACK | CUTANEOUS | Status: AC
  Filled 2014-05-19: qty 1

## 2014-05-19 MED ORDER — ONDANSETRON HCL 4 MG/2ML IJ SOLN
4.0000 mg | Freq: Four times a day (QID) | INTRAMUSCULAR | Status: DC | PRN
  Administered 2014-05-20: 4 mg via INTRAVENOUS
  Filled 2014-05-19: qty 2

## 2014-05-19 MED ORDER — NEOSTIGMINE METHYLSULFATE 10 MG/10ML IV SOLN
INTRAVENOUS | Status: DC | PRN
  Administered 2014-05-19: 3 mg via INTRAVENOUS

## 2014-05-19 MED ORDER — HYDROMORPHONE HCL 1 MG/ML IJ SOLN
INTRAMUSCULAR | Status: AC
Start: 2014-05-19 — End: 2014-05-20
  Filled 2014-05-19: qty 1

## 2014-05-19 MED ORDER — KCL IN DEXTROSE-NACL 20-5-0.9 MEQ/L-%-% IV SOLN
INTRAVENOUS | Status: DC
Start: 2014-05-19 — End: 2014-05-24
  Administered 2014-05-19: 16:00:00 via INTRAVENOUS
  Administered 2014-05-20: 75 mL/h via INTRAVENOUS
  Administered 2014-05-20: 1000 mL via INTRAVENOUS
  Administered 2014-05-21 (×2): via INTRAVENOUS
  Administered 2014-05-23: 50 mL/h via INTRAVENOUS
  Filled 2014-05-19 (×8): qty 1000

## 2014-05-19 MED ORDER — ONDANSETRON HCL 4 MG/2ML IJ SOLN
INTRAMUSCULAR | Status: AC
  Filled 2014-05-19: qty 2

## 2014-05-19 MED ORDER — ROCURONIUM BROMIDE 100 MG/10ML IV SOLN
INTRAVENOUS | Status: DC | PRN
  Administered 2014-05-19: 40 mg via INTRAVENOUS
  Administered 2014-05-19 (×5): 10 mg via INTRAVENOUS

## 2014-05-19 MED ORDER — HEPARIN SODIUM (PORCINE) 5000 UNIT/ML IJ SOLN
5000.0000 [IU] | Freq: Three times a day (TID) | INTRAMUSCULAR | Status: DC
Start: 2014-05-19 — End: 2014-05-24
  Administered 2014-05-19 – 2014-05-24 (×14): 5000 [IU] via SUBCUTANEOUS
  Filled 2014-05-19 (×18): qty 1

## 2014-05-19 MED ORDER — ROCURONIUM BROMIDE 100 MG/10ML IV SOLN
INTRAVENOUS | Status: AC
  Filled 2014-05-19: qty 1

## 2014-05-19 MED ORDER — MEPERIDINE HCL 50 MG/ML IJ SOLN
6.2500 mg | INTRAMUSCULAR | Status: DC | PRN
  Administered 2014-05-19 (×2): 12.5 mg via INTRAVENOUS

## 2014-05-19 MED ORDER — FENTANYL CITRATE 0.05 MG/ML IJ SOLN
INTRAMUSCULAR | Status: DC | PRN
  Administered 2014-05-19 (×2): 50 ug via INTRAVENOUS
  Administered 2014-05-19: 100 ug via INTRAVENOUS
  Administered 2014-05-19: 50 ug via INTRAVENOUS

## 2014-05-19 MED ORDER — PROPOFOL 10 MG/ML IV BOLUS
INTRAVENOUS | Status: AC
  Filled 2014-05-19: qty 20

## 2014-05-19 MED ORDER — PHENYLEPHRINE HCL 10 MG/ML IJ SOLN
INTRAMUSCULAR | Status: DC | PRN
  Administered 2014-05-19: 40 ug via INTRAVENOUS

## 2014-05-19 MED ORDER — ONDANSETRON HCL 4 MG PO TABS: 4.0000 mg | ORAL_TABLET | Freq: Four times a day (QID) | ORAL | Status: DC | PRN

## 2014-05-19 MED ORDER — ALPRAZOLAM 0.25 MG PO TABS: 0.1250 mg | ORAL_TABLET | Freq: Every evening | ORAL | Status: DC | PRN

## 2014-05-19 MED ORDER — MEPERIDINE HCL 50 MG/ML IJ SOLN
INTRAMUSCULAR | Status: AC
  Filled 2014-05-19: qty 1

## 2014-05-19 MED ORDER — HYDROMORPHONE HCL 1 MG/ML IJ SOLN
INTRAMUSCULAR | Status: AC
  Filled 2014-05-19: qty 1

## 2014-05-19 MED ORDER — BUPIVACAINE HCL (PF) 0.5 % IJ SOLN
INTRAMUSCULAR | Status: AC
  Filled 2014-05-19: qty 30

## 2014-05-19 MED ORDER — ALVIMOPAN 12 MG PO CAPS
12.0000 mg | ORAL_CAPSULE | Freq: Two times a day (BID) | ORAL | Status: DC
  Administered 2014-05-20 – 2014-05-21 (×3): 12 mg via ORAL
  Filled 2014-05-19 (×4): qty 1

## 2014-05-19 MED ORDER — CHLORHEXIDINE GLUCONATE CLOTH 2 % EX PADS
6.0000 | MEDICATED_PAD | Freq: Once | CUTANEOUS | Status: DC
Start: 2014-05-19 — End: 2014-05-19

## 2014-05-19 MED ORDER — HYDROMORPHONE HCL 1 MG/ML IJ SOLN
INTRAMUSCULAR | Status: DC | PRN
  Administered 2014-05-19 (×4): 0.5 mg via INTRAVENOUS

## 2014-05-19 MED ORDER — OXYCODONE-ACETAMINOPHEN 5-325 MG PO TABS
1.0000 | ORAL_TABLET | ORAL | Status: DC | PRN
  Administered 2014-05-21: 2 via ORAL
  Administered 2014-05-21 – 2014-05-22 (×5): 1 via ORAL
  Administered 2014-05-23 – 2014-05-24 (×2): 2 via ORAL
  Filled 2014-05-19: qty 1
  Filled 2014-05-19: qty 2
  Filled 2014-05-19 (×3): qty 1
  Filled 2014-05-19 (×2): qty 2
  Filled 2014-05-19: qty 1

## 2014-05-19 MED ORDER — METOPROLOL SUCCINATE 12.5 MG HALF TABLET
37.5000 mg | ORAL_TABLET | Freq: Every morning | ORAL | Status: DC
  Administered 2014-05-20 – 2014-05-24 (×5): 37.5 mg via ORAL
  Filled 2014-05-19 (×5): qty 1

## 2014-05-19 MED ORDER — PHENYLEPHRINE 40 MCG/ML (10ML) SYRINGE FOR IV PUSH (FOR BLOOD PRESSURE SUPPORT)
PREFILLED_SYRINGE | INTRAVENOUS | Status: AC
  Filled 2014-05-19: qty 10

## 2014-05-19 MED ORDER — GLYCOPYRROLATE 0.2 MG/ML IJ SOLN
INTRAMUSCULAR | Status: DC | PRN
  Administered 2014-05-19: .6 mg via INTRAVENOUS

## 2014-05-19 MED ORDER — FENTANYL CITRATE 0.05 MG/ML IJ SOLN
INTRAMUSCULAR | Status: AC
  Filled 2014-05-19: qty 5

## 2014-05-19 MED ORDER — MORPHINE SULFATE 2 MG/ML IJ SOLN
2.0000 mg | INTRAMUSCULAR | Status: DC | PRN
  Administered 2014-05-19: 2 mg via INTRAVENOUS
  Administered 2014-05-20: 4 mg via INTRAVENOUS
  Administered 2014-05-20: 2 mg via INTRAVENOUS
  Administered 2014-05-20: 4 mg via INTRAVENOUS
  Administered 2014-05-20: 2 mg via INTRAVENOUS
  Filled 2014-05-19: qty 1
  Filled 2014-05-19: qty 2
  Filled 2014-05-19 (×2): qty 1
  Filled 2014-05-19: qty 2

## 2014-05-19 MED ORDER — CHLORHEXIDINE GLUCONATE CLOTH 2 % EX PADS: 6.0000 | MEDICATED_PAD | Freq: Once | CUTANEOUS | Status: DC

## 2014-05-19 MED ORDER — MIDAZOLAM HCL 5 MG/5ML IJ SOLN
INTRAMUSCULAR | Status: DC | PRN
  Administered 2014-05-19: 2 mg via INTRAVENOUS

## 2014-05-19 MED ORDER — HYDROMORPHONE HCL 1 MG/ML IJ SOLN
0.2500 mg | INTRAMUSCULAR | Status: DC | PRN
  Administered 2014-05-19 (×4): 0.5 mg via INTRAVENOUS

## 2014-05-19 MED ORDER — LACTATED RINGERS IV SOLN
INTRAVENOUS | Status: DC | PRN
  Administered 2014-05-19: 10:00:00 via INTRAVENOUS

## 2014-05-19 SURGICAL SUPPLY — 59 items
APPLIER CLIP ROT 10 11.4 M/L (STAPLE)
APR CLP MED LRG 11.4X10 (STAPLE)
BLADE HEX COATED 2.75 (ELECTRODE) ×3 IMPLANT
CLIP APPLIE ROT 10 11.4 M/L (STAPLE) IMPLANT
COVER MAYO STAND STRL (DRAPES) ×3 IMPLANT
DRAPE LAPAROSCOPIC ABDOMINAL (DRAPES) ×3 IMPLANT
DRAPE WARM FLUID 44X44 (DRAPE) ×3 IMPLANT
DRSG OPSITE POSTOP 4X8 (GAUZE/BANDAGES/DRESSINGS) ×2 IMPLANT
ELECT REM PT RETURN 9FT ADLT (ELECTROSURGICAL) ×3
ELECTRODE REM PT RTRN 9FT ADLT (ELECTROSURGICAL) ×1 IMPLANT
ENDOLOOP SUT PDS II  0 18 (SUTURE)
ENDOLOOP SUT PDS II 0 18 (SUTURE) IMPLANT
GAUZE SPONGE 4X4 12PLY STRL (GAUZE/BANDAGES/DRESSINGS) ×3 IMPLANT
GLOVE BIOGEL PI IND STRL 7.0 (GLOVE) ×1 IMPLANT
GLOVE BIOGEL PI INDICATOR 7.0 (GLOVE) ×2
GOWN STRL REUS W/TWL LRG LVL3 (GOWN DISPOSABLE) ×3 IMPLANT
GOWN STRL REUS W/TWL XL LVL3 (GOWN DISPOSABLE) ×6 IMPLANT
KIT BASIN OR (CUSTOM PROCEDURE TRAY) ×3 IMPLANT
LEGGING LITHOTOMY PAIR STRL (DRAPES) ×3 IMPLANT
LIGASURE IMPACT 36 18CM CVD LR (INSTRUMENTS) IMPLANT
LIQUID BAND (GAUZE/BANDAGES/DRESSINGS) IMPLANT
MARKER SKIN DUAL TIP RULER LAB (MISCELLANEOUS) ×3 IMPLANT
NS IRRIG 1000ML POUR BTL (IV SOLUTION) ×6 IMPLANT
PENCIL BUTTON HOLSTER BLD 10FT (ELECTRODE) ×3 IMPLANT
RELOAD STAPLER LINEAR PROX 30 (STAPLE) ×1 IMPLANT
RETRACTOR WND ALEXIS 25 LRG (MISCELLANEOUS) IMPLANT
RTRCTR WOUND ALEXIS 25CM LRG (MISCELLANEOUS) ×3
SCALPEL HARMONIC ACE (MISCELLANEOUS) ×2 IMPLANT
SCISSORS LAP 5X35 DISP (ENDOMECHANICALS) ×1 IMPLANT
SEALER TISSUE G2 CVD JAW 35 (ENDOMECHANICALS) ×1 IMPLANT
SEALER TISSUE G2 CVD JAW 45CM (ENDOMECHANICALS) ×2
SET IRRIG TUBING LAPAROSCOPIC (IRRIGATION / IRRIGATOR) IMPLANT
SHEARS HARMONIC ACE PLUS 36CM (ENDOMECHANICALS) ×3 IMPLANT
SPONGE LAP 18X18 X RAY DECT (DISPOSABLE) ×6 IMPLANT
STAPLER RELOAD LINEAR PROX 30 (STAPLE) ×3
STAPLER RELOADABLE 30 BLU REG (STAPLE) IMPLANT
STAPLER VISISTAT 35W (STAPLE) IMPLANT
SUT MNCRL AB 4-0 PS2 18 (SUTURE) IMPLANT
SUT PDS AB 0 CT1 36 (SUTURE) ×4 IMPLANT
SUT PDS AB 1 CT1 27 (SUTURE) ×3 IMPLANT
SUT PDS AB 1 TP1 96 (SUTURE) ×4 IMPLANT
SUT SILK 2 0 (SUTURE) ×3
SUT SILK 2 0 SH CR/8 (SUTURE) ×7 IMPLANT
SUT SILK 2-0 18XBRD TIE 12 (SUTURE) ×1 IMPLANT
SUT SILK 3 0 (SUTURE) ×3
SUT SILK 3 0 SH CR/8 (SUTURE) ×3 IMPLANT
SUT SILK 3-0 18XBRD TIE 12 (SUTURE) ×1 IMPLANT
SUT VIC AB 3-0 54XBRD REEL (SUTURE) IMPLANT
SUT VIC AB 3-0 BRD 54 (SUTURE) ×6
SUT VIC AB 3-0 SH 18 (SUTURE) ×2 IMPLANT
TOWEL OR 17X26 10 PK STRL BLUE (TOWEL DISPOSABLE) ×6 IMPLANT
TRAY FOLEY CATH 14FRSI W/METER (CATHETERS) ×3 IMPLANT
TRAY LAPAROSCOPIC (CUSTOM PROCEDURE TRAY) ×3 IMPLANT
TROCAR BLADELESS OPT 5 75 (ENDOMECHANICALS) ×9 IMPLANT
TROCAR XCEL BLUNT TIP 100MML (ENDOMECHANICALS) ×3 IMPLANT
TROCAR XCEL NON-BLD 11X100MML (ENDOMECHANICALS) IMPLANT
TROCAR XCEL UNIV SLVE 11M 100M (ENDOMECHANICALS) IMPLANT
TUBING INSUFFLATION 10FT LAP (TUBING) ×1 IMPLANT
YANKAUER SUCT BULB TIP 10FT TU (MISCELLANEOUS) ×3 IMPLANT

## 2014-05-19 NOTE — Transfer of Care (Signed)
Immediate Anesthesia Transfer of Care Note  Patient: Kristen Reyes  Procedure(s) Performed: Procedure(s) (LRB): TAKEDOWN HARTMAN COLOSTOMY (N/A)  Patient Location: PACU  Anesthesia Type: General  Level of Consciousness: sedated, patient cooperative and responds to stimulation  Airway & Oxygen Therapy: Patient Spontanous Breathing and Patient connected to face mask oxgen  Post-op Assessment: Report given to PACU RN and Post -op Vital signs reviewed and stable  Post vital signs: Reviewed and stable  Complications: No apparent anesthesia complications

## 2014-05-19 NOTE — Anesthesia Postprocedure Evaluation (Signed)
  Anesthesia Post-op Note  Patient: Kristen Reyes  Procedure(s) Performed: Procedure(s) (LRB): TAKEDOWN HARTMAN COLOSTOMY (N/A)  Patient Location: PACU  Anesthesia Type: General  Level of Consciousness: awake and alert   Airway and Oxygen Therapy: Patient Spontanous Breathing  Post-op Pain: mild  Post-op Assessment: Post-op Vital signs reviewed, Patient's Cardiovascular Status Stable, Respiratory Function Stable, Patent Airway and No signs of Nausea or vomiting  Last Vitals:  Filed Vitals:   05/19/14 1600  BP: 133/73  Pulse: 76  Temp: 36.8 C  Resp: 18    Post-op Vital Signs: stable   Complications: No apparent anesthesia complications

## 2014-05-19 NOTE — H&P (Signed)
History of Present Illness Marland Kitchen T. Shanekqua Schaper MD; 04/23/2014 10:20 AM) Patient words: colostomy reversal?.  The patient is a 64 year old female presenting for a post-operative visit. She returns for follow-up status post emergency Hartmann colectomy for perforated diverticulitis now over 6 months postop. She is doing well without any abdominal or GI complaints. I obtained a preoperative barium enema showing no residual significant diverticulosis or abnormalities in the rectal stump or colon. She does have a history of colon cancer in her family and has not had a colonoscopy in over 10 years. She is interested in colostomy reversal.   Allergies Mammie Lorenzo, LPN; 04/08/3126 1:18 AM) Erythromycin *DERMATOLOGICALS* Codeine Phosphate *ANALGESICS - OPIOID* Lidocaine (5%)/Dextrose 7.5% *LOCAL ANESTHETICS-Parenteral*  Medication History Mammie Lorenzo, LPN; 8/67/7373 6:68 AM) Metoprolol Succinate ER (25MG  Tablet ER 24HR, Oral) Active. Aspirin EC (81MG  Tablet DR, Oral) Active. Probiotic (Oral) Active. Multi-Vitamin (Oral) Active. Medications Reconciled  Vitals Claiborne Billings Dockery LPN; 1/59/4707 6:15 AM) 04/23/2014 9:50 AM Weight: 102 lb Height: 63in Body Surface Area: 1.43 m Body Mass Index: 18.07 kg/m Temp.: 30F  Resp.: 18 (Unlabored)  BP: 106/68 (Sitting, Left Arm, Standard)    Physical Exam Marland Kitchen T. Vonda Harth MD; 04/23/2014 10:21 AM) The physical exam findings are as follows: Note:General: Alert, well-developed, thin Caucasian female, in no distress Skin: Warm and dry without rash or infection. HEENT: No palpable masses or thyromegaly. Sclera nonicteric. Pupils equal round and reactive. Lymph nodes: No cervical, supraclavicular, or inguinal nodes palpable. Lungs: Breath sounds clear and equal. No wheezing or increased work of breathing. Cardiovascular: Regular rate and rhythm without murmer. No JVD or edema. Abdomen: Nondistended. Soft and nontender. No masses  palpable. No organomegaly. No palpable hernias. Well-healed low midline incision and healthy colostomy in the left lower quadrant Extremities: No edema or joint swelling or deformity. No chronic venous stasis changes. Neurologic: Alert and fully oriented. Gait normal. No focal weakness. Psychiatric: Normal mood and affect. Thought content appropriate with normal judgement and insight    Assessment & Plan Marland Kitchen T. Nessie Nong MD; 04/23/2014 10:22 AM) COLOSTOMY IN PLACE (V44.3  Z93.3) Impression: Status post Hartmann colectomy. Doing well without complications. No complicating factors seen on her barium study. We discussed proceeding with scheduling for takedown of her [facility] colostomy. We discussed the nature of the surgery and recovery as well as risks of anesthetic complications, bleeding, infection, excessive scar tissue that could prevent the reversal and risk of anastomotic leakage and infection. I'm going to refer her for a preoperative colonoscopy due to her family history of colon cancer and has not had a colonoscopy in over 10 years. Possibly we could do this under a single bowel prep. All her questions were answered. Current Plans  Schedule for Surgery Takedown of Hartmann colostomy Refer for preoperative colonoscopy.

## 2014-05-19 NOTE — Anesthesia Procedure Notes (Signed)
Procedure Name: Intubation Date/Time: 05/19/2014 10:33 AM Performed by: Anne Fu Pre-anesthesia Checklist: Patient identified, Emergency Drugs available, Suction available, Patient being monitored and Timeout performed Patient Re-evaluated:Patient Re-evaluated prior to inductionOxygen Delivery Method: Circle system utilized Preoxygenation: Pre-oxygenation with 100% oxygen Intubation Type: IV induction Ventilation: Mask ventilation without difficulty Laryngoscope Size: Mac and 4 Grade View: Grade I Tube type: Oral Tube size: 7.5 mm Number of attempts: 1 Airway Equipment and Method: Stylet Placement Confirmation: ETT inserted through vocal cords under direct vision,  positive ETCO2,  CO2 detector and breath sounds checked- equal and bilateral Secured at: 19 cm Tube secured with: Tape Dental Injury: Teeth and Oropharynx as per pre-operative assessment

## 2014-05-19 NOTE — Progress Notes (Signed)
Patient states antibiotics taken with bowel prep made her sick  And she could not keep down the last of her antibiotics  She states she feels better now

## 2014-05-19 NOTE — Interval H&P Note (Signed)
History and Physical Interval Note:  05/19/2014 10:12 AM  Kristen Reyes  has presented today for surgery, with the diagnosis of s/p hartman colostomy  The various methods of treatment have been discussed with the patient and family. After consideration of risks, benefits and other options for treatment, the patient has consented to  Procedure(s): LAPAROSCOPIC TAKEDOWN HARTMANY COLOSTOMY POSSIBLE OPEN (N/A) as a surgical intervention .  The patient's history has been reviewed, patient examined, no change in status, stable for surgery.  I have reviewed the patient's chart and labs.  Questions were answered to the patient's satisfaction.     [name]

## 2014-05-19 NOTE — Anesthesia Preprocedure Evaluation (Addendum)
Anesthesia Evaluation  Patient identified by MRN, date of birth, ID band Patient awake    Reviewed: Allergy & Precautions, H&P , NPO status , Patient's Chart, lab work & pertinent test results  History of Anesthesia Complications (+) PONV and history of anesthetic complications  Airway Mallampati: II  TM Distance: >3 FB Neck ROM: Full    Dental no notable dental hx.    Pulmonary neg pulmonary ROS, former smoker,  breath sounds clear to auscultation  Pulmonary exam normal       Cardiovascular hypertension, Pt. on medications and Pt. on home beta blockers Rhythm:Regular Rate:Normal     Neuro/Psych PSYCHIATRIC DISORDERS Anxiety negative neurological ROS     GI/Hepatic negative GI ROS, Neg liver ROS,   Endo/Other  negative endocrine ROS  Renal/GU negative Renal ROS     Musculoskeletal negative musculoskeletal ROS (+)   Abdominal (+)  Abdomen: rigid.    Peds  Hematology negative hematology ROS (+)   Anesthesia Other Findings   Reproductive/Obstetrics negative OB ROS                            Anesthesia Physical  Anesthesia Plan  ASA: II  Anesthesia Plan: General   Post-op Pain Management:    Induction: Intravenous  Airway Management Planned: Oral ETT  Additional Equipment: None  Intra-op Plan:   Post-operative Plan: Extubation in OR  Informed Consent: I have reviewed the patients History and Physical, chart, labs and discussed the procedure including the risks, benefits and alternatives for the proposed anesthesia with the patient or authorized representative who has indicated his/her understanding and acceptance.   Dental advisory given  Plan Discussed with: CRNA and Surgeon  Anesthesia Plan Comments:         Anesthesia Quick Evaluation

## 2014-05-19 NOTE — Op Note (Signed)
Preoperative Diagnosis: sp hartman colostomy  Postoprative Diagnosis: sp hartman colostomy  Procedure: Procedure(s): TAKEDOWN HARTMAN COLOSTOMY   Surgeon: Excell Seltzer T   Assistants: Alphonsa Overall  Anesthesia:  General endotracheal anesthesia  Indications: Patient is approximately 9 months following Hartmann colectomy for perforated diverticulitis with significant peritonitis. She has done well and desires colostomy takedown. Preoperative barium enema showed no problems with her colon or rectosigmoid. We discussed the procedure in detail and risks documented extensively elsewhere. Following a mechanical and antibiotic bowel prep at home she presents for surgery.    Procedure Detail:  Patient was brought to the operating room, placed in the supine position on the operating table, and general endotracheal anesthesia induced. She was carefully positioned in semi-lithotomy position. Her colostomy which appeared healthy was occluded at the skin level with a pursestring suture of 0 silk. Foley catheter was placed. She received preoperative IV antibiotics. The abdomen and perineum were widely sterilely prepped and draped. Patient timeout was performed and correct procedure verified. The previous low midline incision was used excising the scar and dissection was carried down through the subcutaneous tissue and midline fascia the peritoneum entered under direct vision. Fairly extensive adhesions of the omentum to the anterior abdominal wall and down into the pelvis and down to bowel loops were divided with cautery and sharp dissection. There were some small bowel loop adhesions down in the pelvis but these were fairly filmy and the small bowel was able to be completely delivered up out of the pelvis without difficulty. The appendix was actually densely adherent along the left pelvic side wall and this was carefully dissected away with the Harmonic scalpel and I did perform an appendectomy taking the  rest of the mesial appendix with the Harmonic scalpel and dividing the appendix at the tip of the cecum with a TA-30 blue load stapler. The cecum was then further mobilized and was able to be packed up out of the pelvis. The suture on the end of the Hartmann stump was identified. The rectosigmoid stump was fairly adherent posteriorly to the sacrum but with careful blunt and Harmonic scalpel dissection I mobilized this up away from the sacral promontory and sacrum and dissected distally until we encountered normal soft rectosigmoid appropriate for anastomosis. Following this attention was turned to the colostomy. Adhesions around the colostomy site were completely taken down and the colon mobilized up to the abdominal wall. The stoma was elliptically excised at the skin level and dissection carried down through the subcuticular tissue with cautery and final attachments along the fascia and peritoneum were divided and the colon delivered into the abdomen. The left colon was mobilized dividing lateral peritoneal attachments and all adhesions working up toward the splenic flexure. This allowed good mobility down into the pelvis. The patient is very petite and the left colon and rectosigmoid appeared healthy and normal but were very small caliber and I elected to do a handsewn anastomosis as we had very good exposure in the pelvis. Healthy left colon was chosen just proximal to the colostomy site and 2 stay sutures of 2-0 silk were placed and the bowel divided at this point. After placing 2 stay sutures on either side of the rectosigmoid it was sharply divided across the soft healthy area previously identified. Following this a single layer anastomosis was created with inverting 2-0 silk sutures. There was no tension and the blood supply appeared very good. Following completion of the anastomosis Dr. Lucia Gaskins went below and tensely distended the rectum with the  proximal bowel clamped. Under saline irrigation there was a  small stream of bubbles which could be clearly seen between 2 of the anterior sutures. I placed a deep mattress suture at this point and then further tense distention of the bowel under saline irrigation there was no leakage. Following this all drapes and instruments gloves and gowns were changed. The abdomen was thoroughly irrigated and hemostasis assured. The viscera returned to their anatomic position. The fascia at the colostomy site was closed with running 0 PDS. The midline fascia was closed with running looped #1 PDS beginning at either end and tied centrally. The subcutaneous tissue was irrigated and the skin closed with staples loosely closing the colostomy skin with Telfa wicks. Sponge needle and instrument counts were correct.    Findings: As above  Estimated Blood Loss:  less than 100 mL         Drains: none  Blood Given: none          Specimens: Colostomy and appendix        Complications:  * No complications entered in OR log *         Disposition: PACU - hemodynamically stable.         Condition: stable

## 2014-05-20 ENCOUNTER — Encounter (HOSPITAL_COMMUNITY): Payer: Self-pay | Admitting: General Surgery

## 2014-05-20 LAB — BASIC METABOLIC PANEL
Anion gap: 5 (ref 5–15)
BUN: 8 mg/dL (ref 6–23)
CO2: 28 mmol/L (ref 19–32)
Calcium: 8 mg/dL — ABNORMAL LOW (ref 8.4–10.5)
Chloride: 107 mmol/L (ref 96–112)
Creatinine, Ser: 0.61 mg/dL (ref 0.50–1.10)
GFR calc Af Amer: >90 mL/min (ref >90)
GFR calc non Af Amer: >90 mL/min (ref >90)
GLUCOSE: 138 mg/dL — AB (ref 70–99)
Potassium: 3.8 mmol/L (ref 3.5–5.1)
Sodium: 140 mmol/L (ref 135–145)

## 2014-05-20 LAB — CBC
HCT: 35.8 % — ABNORMAL LOW (ref 36.0–46.0)
HEMOGLOBIN: 11.8 g/dL — AB (ref 12.0–15.0)
MCH: 29.4 pg (ref 26.0–34.0)
MCHC: 33 g/dL (ref 30.0–36.0)
MCV: 89.1 fL (ref 78.0–100.0)
Platelets: 213 10*3/uL (ref 150–400)
RBC: 4.02 MIL/uL (ref 3.87–5.11)
RDW: 12.7 % (ref 11.5–15.5)
WBC: 9 10*3/uL (ref 4.0–10.5)

## 2014-05-20 MED ORDER — KETOROLAC TROMETHAMINE 15 MG/ML IJ SOLN
15.0000 mg | Freq: Four times a day (QID) | INTRAMUSCULAR | Status: AC
  Administered 2014-05-20 – 2014-05-22 (×6): 15 mg via INTRAVENOUS
  Filled 2014-05-20 (×8): qty 1

## 2014-05-20 MED ORDER — ALPRAZOLAM 0.25 MG PO TABS
0.1250 mg | ORAL_TABLET | Freq: Every evening | ORAL | Status: DC | PRN
  Administered 2014-05-20 – 2014-05-23 (×4): 0.25 mg via ORAL
  Filled 2014-05-20 (×4): qty 1

## 2014-05-20 MED ORDER — HYDROMORPHONE HCL 1 MG/ML IJ SOLN
1.0000 mg | INTRAMUSCULAR | Status: DC | PRN
  Administered 2014-05-20: 1 mg via INTRAVENOUS
  Filled 2014-05-20: qty 1

## 2014-05-20 MED ORDER — LIP MEDEX EX OINT
TOPICAL_OINTMENT | CUTANEOUS | Status: AC
  Administered 2014-05-20: 11:00:00
  Filled 2014-05-20: qty 7

## 2014-05-20 NOTE — Care Management Note (Signed)
    Page 1 of 1   05/20/2014     11:24:59 AM CARE MANAGEMENT NOTE 05/20/2014  Patient:  Kristen Reyes, Kristen Reyes   Account Number:  [id number]  Date Initiated:  05/20/2014  Documentation initiated by:  Sunday Spillers  Subjective/Objective Assessment:   64 yo female admitted s/p colostomy takedown. PTA lived at home with spouse.     Action/Plan:   Home when stable   Anticipated DC Date:  05/22/2014   Anticipated DC Plan:  [place]  CM consult      Choice offered to / List presented to:             Status of service:  Completed, signed off Medicare Important Message given?   (If response is "NO", the following Medicare IM given date fields will be blank) Date Medicare IM given:   Medicare IM given by:   Date Additional Medicare IM given:   Additional Medicare IM given by:    Discharge Disposition:  HOME/SELF CARE  Per UR Regulation:  Reviewed for med. necessity/level of care/duration of stay  If discussed at [place] of Stay Meetings, dates discussed:    Comments:

## 2014-05-20 NOTE — Progress Notes (Signed)
Patient ID: Kristen Reyes, female   DOB: 05-03-1950, 64 y.o.   MRN: [id number] 1 Day Post-Op  Subjective: Incisional pain not that well controlled with meds. No nausea. No other complaints.  Objective: Vital signs in last 24 hours: Temp:  [97.9 F (36.6 C)-99 F (37.2 C)] 98.9 F (37.2 C) (03/23 0803) Pulse Rate:  [50-96] 67 (03/23 0803) Resp:  [14-20] 18 (03/23 0803) BP: (102-148)/(55-107) 121/58 mmHg (03/23 0803) SpO2:  [97 %-100 %] 100 % (03/23 0803) Weight:  [45.898 kg (101 lb 3 oz)] 45.898 kg (101 lb 3 oz) (03/22 0856) Last BM Date: 05/19/14 (via colostomy)  Intake/Output from previous day: 03/22 0701 - 03/23 0700 In: 2685 [I.V.:2685] Out: 900 [Urine:850; Blood:50] Intake/Output this shift:    General appearance: alert, cooperative and no distress GI: moderate appropriate incisional tenderness Incision/Wound: no erythema or unusual drainage  Lab Results:   Recent Labs  05/20/14 0445  WBC 9.0  HGB 11.8*  HCT 35.8*  PLT 213   BMET  Recent Labs  05/20/14 0445  NA 140  K 3.8  CL 107  CO2 28  GLUCOSE 138*  BUN 8  CREATININE 0.61  CALCIUM 8.0*     Studies/Results: No results found.  Anti-infectives: Anti-infectives    Start     Dose/Rate Route Frequency Ordered Stop   05/19/14 0825  cefoTEtan (CEFOTAN) 2 g in dextrose 5 % 50 mL IVPB     2 g 100 mL/hr over 30 Minutes Intravenous On call to O.R. 05/19/14 0825 05/19/14 1048      Assessment/Plan: s/p Procedure(s): TAKEDOWN HARTMAN COLOSTOMY Stable postoperatively. Change pain medicine to Dilaudid and add Toradol. Start clear liquid diet. Out of bed today.   LOS: 1 day    Jamicheal Heard T 05/20/2014

## 2014-05-21 LAB — CBC
HEMATOCRIT: 33.6 % — AB (ref 36.0–46.0)
HEMOGLOBIN: 10.9 g/dL — AB (ref 12.0–15.0)
MCH: 29.1 pg (ref 26.0–34.0)
MCHC: 32.4 g/dL (ref 30.0–36.0)
MCV: 89.6 fL (ref 78.0–100.0)
Platelets: 169 10*3/uL (ref 150–400)
RBC: 3.75 MIL/uL — ABNORMAL LOW (ref 3.87–5.11)
RDW: 12.7 % (ref 11.5–15.5)
WBC: 6.1 10*3/uL (ref 4.0–10.5)

## 2014-05-21 LAB — BASIC METABOLIC PANEL
ANION GAP: 7 (ref 5–15)
BUN: <5 mg/dL — ABNORMAL LOW (ref 6–23)
CALCIUM: 8.5 mg/dL (ref 8.4–10.5)
CHLORIDE: 107 mmol/L (ref 96–112)
CO2: 27 mmol/L (ref 19–32)
Creatinine, Ser: 0.53 mg/dL (ref 0.50–1.10)
GFR calc Af Amer: >90 mL/min (ref >90)
GFR calc non Af Amer: >90 mL/min (ref >90)
Glucose, Bld: 126 mg/dL — ABNORMAL HIGH (ref 70–99)
Potassium: 3.6 mmol/L (ref 3.5–5.1)
Sodium: 141 mmol/L (ref 135–145)

## 2014-05-21 NOTE — Progress Notes (Signed)
Patient ID: Kristen Reyes, female   DOB: [birth year], 64 y.o.   MRN: [id number] 2 Days Post-Op  Subjective: Feels much better today. No complaints. Tolerating clear liquids. Had a small amount of flatus. Pain under much better control. No nausea.  Objective: Vital signs in last 24 hours: Temp:  [98.2 F (36.8 C)-98.7 F (37.1 C)] 98.4 F (36.9 C) (03/24 0520) Pulse Rate:  [70-73] 71 (03/24 0520) Resp:  [16-18] 16 (03/24 0520) BP: (108-124)/(62-76) 108/64 mmHg (03/24 0520) SpO2:  [97 %-100 %] 97 % (03/24 0520) Last BM Date: 05/19/14 (PTA)  Intake/Output from previous day: 03/23 0701 - 03/24 0700 In: 2349.2 [P.O.:480; I.V.:1869.2] Out: 1975 [Urine:1975] Intake/Output this shift:    General appearance: alert, cooperative and no distress GI: normal findings: soft, non-tender and minimal distention Incision/Wound: no evidence of infection  Lab Results:   Recent Labs  05/20/14 0445 05/21/14 0515  WBC 9.0 6.1  HGB 11.8* 10.9*  HCT 35.8* 33.6*  PLT 213 169   BMET  Recent Labs  05/20/14 0445 05/21/14 0515  NA 140 141  K 3.8 3.6  CL 107 107  CO2 28 27  GLUCOSE 138* 126*  BUN 8 <5*  CREATININE 0.61 0.53  CALCIUM 8.0* 8.5     Studies/Results: No results found.  Anti-infectives: Anti-infectives    Start     Dose/Rate Route Frequency Ordered Stop   05/19/14 0825  cefoTEtan (CEFOTAN) 2 g in dextrose 5 % 50 mL IVPB     2 g 100 mL/hr over 30 Minutes Intravenous On call to O.R. 05/19/14 0825 05/19/14 1048      Assessment/Plan: s/p Procedure(s): TAKEDOWN HARTMAN COLOSTOMY Doing well. Advance to full liquid diet. Ambulation encouraged.   LOS: 2 days    Branson Kranz T 05/21/2014

## 2014-05-22 ENCOUNTER — Encounter (HOSPITAL_COMMUNITY): Payer: Self-pay | Admitting: Surgery

## 2014-05-22 DIAGNOSIS — I10 Essential (primary) hypertension: Secondary | ICD-10-CM | POA: Diagnosis present

## 2014-05-22 DIAGNOSIS — F419 Anxiety disorder, unspecified: Secondary | ICD-10-CM | POA: Diagnosis present

## 2014-05-22 LAB — CBC
HCT: 32.6 % — ABNORMAL LOW (ref 36.0–46.0)
HEMOGLOBIN: 10.6 g/dL — AB (ref 12.0–15.0)
MCH: 29.1 pg (ref 26.0–34.0)
MCHC: 32.5 g/dL (ref 30.0–36.0)
MCV: 89.6 fL (ref 78.0–100.0)
Platelets: 160 10*3/uL (ref 150–400)
RBC: 3.64 MIL/uL — ABNORMAL LOW (ref 3.87–5.11)
RDW: 12.6 % (ref 11.5–15.5)
WBC: 3.8 10*3/uL — AB (ref 4.0–10.5)

## 2014-05-22 NOTE — Progress Notes (Signed)
General Surgery Note  LOS: 3 days  POD -  3 Days Post-Op  Assessment/Plan: 1.  TAKEDOWN HARTMAN COLOSTOMY - [facility] - 05/19/2014  Doing well on full liquids  Will leave on that diet for now  2.  HTN 3.  DVT prophylaxis - SQ Heparin   Principal Problem:   Diverticulitis of sigmoid colon s/p colectomy.  Colostomy takedown 05/19/2014 Active Problems:   Hypertension   Anxiety  Subjective:  Doing well.  No complaint.  Husband in room. Objective:   Filed Vitals:   05/22/14 0520  BP: 117/68  Pulse: 73  Temp: 98.3 F (36.8 C)  Resp:      Intake/Output from previous day:  03/24 0701 - 03/25 0700 In: 1566.7 [P.O.:660; I.V.:906.7] Out: 2800 [Urine:2800]  Intake/Output this shift:      Physical Exam:   General: WN thin WF who is alert and oriented.    HEENT: Normal. Pupils equal. .   Lungs: Clear.  IS to 1,250 cc   Abdomen: Mildly distended.   BS present.   Wound: Clean.  Dressing intact.     Lab Results:    Recent Labs  05/21/14 0515 05/22/14 0450  WBC 6.1 3.8*  HGB 10.9* 10.6*  HCT 33.6* 32.6*  PLT 169 160    BMET   Recent Labs  05/20/14 0445 05/21/14 0515  NA 140 141  K 3.8 3.6  CL 107 107  CO2 28 27  GLUCOSE 138* 126*  BUN 8 <5*  CREATININE 0.61 0.53  CALCIUM 8.0* 8.5    PT/INR  No results for input(s): LABPROT, INR in the last 72 hours.  ABG  No results for input(s): PHART, HCO3 in the last 72 hours.  Invalid input(s): PCO2, PO2   Studies/Results:  No results found.   Anti-infectives:   Anti-infectives    Start     Dose/Rate Route Frequency Ordered Stop   05/19/14 0825  cefoTEtan (CEFOTAN) 2 g in dextrose 5 % 50 mL IVPB     2 g 100 mL/hr over 30 Minutes Intravenous On call to O.R. 05/19/14 0825 05/19/14 1048      Alphonsa Overall, MD, FACS Pager: [facility] Office: [phone] 05/22/2014

## 2014-05-23 NOTE — Progress Notes (Signed)
General Surgery Note  LOS: 4 days  POD -  4 Days Post-Op  Assessment/Plan: 1.  TAKEDOWN HARTMAN COLOSTOMY - [facility] - 05/19/2014  To advance to reg diet  1A.  Wicks removed from ostomy wound  2.  HTN 3.  DVT prophylaxis - SQ Heparin   Principal Problem:   Diverticulitis of sigmoid colon s/p colectomy.  Colostomy takedown 05/19/2014 Active Problems:   Hypertension   Anxiety  Subjective:  Doing well.  Having some BM, but pasty.  No nausea.  In room by self. Objective:   Filed Vitals:   05/23/14 0521  BP: 130/74  Pulse: 73  Temp: 98.1 F (36.7 C)  Resp: 18     Intake/Output from previous day:  03/25 0701 - 03/26 0700 In: 2640 [P.O.:1440; I.V.:1200] Out: 2500 [Urine:2500]  Intake/Output this shift:      Physical Exam:   General: WN thin WF who is alert and oriented.    HEENT: Normal. Pupils equal. .   Lungs: Clear.     Abdomen: Mildly distended.   BS present.   Wound: Clean.  I removed wicks from LLQ wound.     Lab Results:     Recent Labs  05/21/14 0515 05/22/14 0450  WBC 6.1 3.8*  HGB 10.9* 10.6*  HCT 33.6* 32.6*  PLT 169 160    BMET    Recent Labs  05/21/14 0515  NA 141  K 3.6  CL 107  CO2 27  GLUCOSE 126*  BUN <5*  CREATININE 0.53  CALCIUM 8.5    PT/INR  No results for input(s): LABPROT, INR in the last 72 hours.  ABG  No results for input(s): PHART, HCO3 in the last 72 hours.  Invalid input(s): PCO2, PO2   Studies/Results:  No results found.   Anti-infectives:   Anti-infectives    Start     Dose/Rate Route Frequency Ordered Stop   05/19/14 0825  cefoTEtan (CEFOTAN) 2 g in dextrose 5 % 50 mL IVPB     2 g 100 mL/hr over 30 Minutes Intravenous On call to O.R. 05/19/14 0825 05/19/14 1048      Alphonsa Overall, MD, FACS Pager: [facility] Office: [phone] 05/23/2014

## 2014-05-24 MED ORDER — OXYCODONE-ACETAMINOPHEN 5-325 MG PO TABS: 1.0000 | ORAL_TABLET | Freq: Four times a day (QID) | ORAL | Status: DC | PRN

## 2014-05-24 NOTE — Discharge Summary (Signed)
Physician Discharge Summary  Patient ID:  Kristen Reyes  MRN: [id number]  DOB/AGE: 04-15-1950 64 y.o.  Admit date: 05/19/2014 Discharge date: 05/24/2014  Discharge Diagnoses:  1. History of perforated diverticulitis - TAKEDOWN HARTMAN COLOSTOMY - [facility] - 05/19/2014 2. HTN   Principal Problem:   Diverticulitis of sigmoid colon s/p colectomy.  Colostomy takedown 05/19/2014 Active Problems:   Hypertension   Anxiety  Operation: Procedure(s):  TAKEDOWN HARTMAN COLOSTOMY on 05/19/2014 - [facility]  Discharged Condition: good  Hospital Course: Kristen Reyes is an 64 y.o. female whose primary care physician is Shirline Frees, MD and who was admitted 05/19/2014 with a chief complaint of colostomy.   She was brought to the operating room on 05/19/2014 and underwent  TAKEDOWN HARTMAN COLOSTOMY.   Post op she did well.  She was advanced to a reg diet.  She has had small BM's.  Her wicks from her ostomy were removed 05/23/2014.  She is ready for discharge. The discharge instructions were reviewed with the patient.  Consults: None  Significant Diagnostic Studies: Results for orders placed or performed during the hospital encounter of 99/24/26  Basic metabolic panel  Result Value Ref Range   Sodium 140 135 - 145 mmol/L   Potassium 3.8 3.5 - 5.1 mmol/L   Chloride 107 96 - 112 mmol/L   CO2 28 19 - 32 mmol/L   Glucose, Bld 138 (H) 70 - 99 mg/dL   BUN 8 6 - 23 mg/dL   Creatinine, Ser 0.61 0.50 - 1.10 mg/dL   Calcium 8.0 (L) 8.4 - 10.5 mg/dL   GFR calc non Af Amer >90 >90 mL/min   GFR calc Af Amer >90 >90 mL/min   Anion gap 5 5 - 15  CBC  Result Value Ref Range   WBC 9.0 4.0 - 10.5 K/uL   RBC 4.02 3.87 - 5.11 MIL/uL   Hemoglobin 11.8 (L) 12.0 - 15.0 g/dL   HCT 35.8 (L) 36.0 - 46.0 %   MCV 89.1 78.0 - 100.0 fL   MCH 29.4 26.0 - 34.0 pg   MCHC 33.0 30.0 - 36.0 g/dL   RDW 12.7 11.5 - 15.5 %   Platelets 213 150 - 400 K/uL  CBC  Result Value Ref Range   WBC 6.1 4.0 - 10.5 K/uL    RBC 3.75 (L) 3.87 - 5.11 MIL/uL   Hemoglobin 10.9 (L) 12.0 - 15.0 g/dL   HCT 33.6 (L) 36.0 - 46.0 %   MCV 89.6 78.0 - 100.0 fL   MCH 29.1 26.0 - 34.0 pg   MCHC 32.4 30.0 - 36.0 g/dL   RDW 12.7 11.5 - 15.5 %   Platelets 169 150 - 400 K/uL  Basic metabolic panel  Result Value Ref Range   Sodium 141 135 - 145 mmol/L   Potassium 3.6 3.5 - 5.1 mmol/L   Chloride 107 96 - 112 mmol/L   CO2 27 19 - 32 mmol/L   Glucose, Bld 126 (H) 70 - 99 mg/dL   BUN <5 (L) 6 - 23 mg/dL   Creatinine, Ser 0.53 0.50 - 1.10 mg/dL   Calcium 8.5 8.4 - 10.5 mg/dL   GFR calc non Af Amer >90 >90 mL/min   GFR calc Af Amer >90 >90 mL/min   Anion gap 7 5 - 15  CBC  Result Value Ref Range   WBC 3.8 (L) 4.0 - 10.5 K/uL   RBC 3.64 (L) 3.87 - 5.11 MIL/uL   Hemoglobin 10.6 (L) 12.0 - 15.0 g/dL  HCT 32.6 (L) 36.0 - 46.0 %   MCV 89.6 78.0 - 100.0 fL   MCH 29.1 26.0 - 34.0 pg   MCHC 32.5 30.0 - 36.0 g/dL   RDW 12.6 11.5 - 15.5 %   Platelets 160 150 - 400 K/uL    No results found.  Discharge Exam:  Filed Vitals:   05/24/14 0624  BP: 130/76  Pulse: 77  Temp: 98.9 F (37.2 C)  Resp: 16    General: Thin WM WF who is alert and generally healthy appearing.  Lungs: Clear to auscultation and symmetric breath sounds. Heart:  RRR. No murmur or rub. Abdomen: Soft.  Normal bowel sounds.  Incisions look good.  Discharge Medications:     Medication List    TAKE these medications        acidophilus Caps capsule  Take 1 capsule by mouth daily.     ALPRAZolam 0.5 MG tablet  Commonly known as:  XANAX  Take 0.125-0.25 mg by mouth at bedtime as needed for anxiety.     aspirin EC 81 MG tablet  Take 81 mg by mouth daily.     docusate sodium 100 MG capsule  Commonly known as:  COLACE  Take 1 capsule (100 mg total) by mouth 2 (two) times daily as needed for mild constipation.     ibuprofen 200 MG tablet  Commonly known as:  ADVIL,MOTRIN  Take 400 mg by mouth every 6 (six) hours as needed for fever or  moderate pain.     metoprolol succinate 25 MG 24 hr tablet  Commonly known as:  TOPROL-XL  Take 37.5 mg by mouth every morning.     multivitamin with minerals Tabs tablet  Take 1 tablet by mouth daily.     oxyCODONE-acetaminophen 5-325 MG per tablet  Commonly known as:  PERCOCET/ROXICET  Take 1-2 tablets by mouth every 6 (six) hours as needed for moderate pain.     polyethylene glycol powder powder  Commonly known as:  MIRALAX  Take 8.5-34 g by mouth 2 (two) times daily. To correct constipation.  Adjust dose over 1-2 months.  Goal = ~1 bowel movement / day     zolpidem 10 MG tablet  Commonly known as:  AMBIEN  Take 10 mg by mouth at bedtime as needed for sleep.        Disposition: 06-Home-Health Care Svc      Discharge Instructions    Diet - low sodium heart healthy    Complete by:  As directed      Increase activity slowly    Complete by:  As directed           Activity:  Driving - May drive in 3 or 4 days, if doing well and on no pain meds   Lifting - No lifting more than 15 pounds for 3 weeks.  Wound Care:   May shower  Diet:  As tolerated  Follow up appointment:  Call Dr. Lear Ng office [facility]) at [phone] for an appointment in next week for staple removal.  Medications and dosages:  Resume your home medications.  You have a prescription for:  Percocet  Signed: Alphonsa Overall, M.D., [facility] Office:  [phone]  05/24/2014, 8:14 AM

## 2014-05-24 NOTE — Discharge Summary (Signed)
Reviewed d/c instructions with pt and spouse including medications, precautions, s/s infection, and follow up appointment.  Pt/spouse verbalized good understanding w/o questions.  Pt being d/c to home into care of spouse.

## 2014-05-24 NOTE — Progress Notes (Signed)
General Surgery Note  LOS: 5 days  POD -  5 Days Post-Op  Assessment/Plan: 1.  TAKEDOWN HARTMAN COLOSTOMY - [facility] - 05/19/2014  On reg diet  Ready to go home.  D/C instructions reviewed.  1A.  Ostomy wound - clean 2.  HTN 3.  DVT prophylaxis - SQ Heparin   Principal Problem:   Diverticulitis of sigmoid colon s/p colectomy.  Colostomy takedown 05/19/2014 Active Problems:   Hypertension   Anxiety  Subjective:  Doing well.  Tolerated reg diet  In room by self. Objective:   Filed Vitals:   05/24/14 0624  BP: 130/76  Pulse: 77  Temp: 98.9 F (37.2 C)  Resp: 16     Intake/Output from previous day:  03/26 0701 - 03/27 0700 In: 600 [I.V.:600] Out: 1950 [Urine:1950]  Intake/Output this shift:      Physical Exam:   General: WN thin WF who is alert and oriented.    HEENT: Normal. Pupils equal. .   Lungs: Clear.     Abdomen: Mildly distended.   BS present.   Wound: Clean.       Lab Results:     Recent Labs  05/22/14 0450  WBC 3.8*  HGB 10.6*  HCT 32.6*  PLT 160    BMET   No results for input(s): NA, K, CL, CO2, GLUCOSE, BUN, CREATININE, CALCIUM in the last 72 hours.  PT/INR  No results for input(s): LABPROT, INR in the last 72 hours.  ABG  No results for input(s): PHART, HCO3 in the last 72 hours.  Invalid input(s): PCO2, PO2   Studies/Results:  No results found.   Anti-infectives:   Anti-infectives    Start     Dose/Rate Route Frequency Ordered Stop   05/19/14 0825  cefoTEtan (CEFOTAN) 2 g in dextrose 5 % 50 mL IVPB     2 g 100 mL/hr over 30 Minutes Intravenous On call to O.R. 05/19/14 0825 05/19/14 1048      Alphonsa Overall, MD, FACS Pager: [facility] Office: [phone] 05/24/2014

## 2014-05-24 NOTE — Discharge Instructions (Signed)
CENTRAL  SURGERY - DISCHARGE INSTRUCTIONS TO PATIENT  Activity:  Driving - May drive in 3 or 4 days, if doing well and on no pain meds   Lifting - No lifting more than 15 pounds for 3 weeks.  Wound Care:   May shower  Diet:  As tolerated  Follow up appointment:  Call Dr. Lear Ng office [facility]) at [phone] for an appointment in next week for staple removal.  Medications and dosages:  Resume your home medications.  You have a prescription for:  Percocet  Call Dr. Excell Seltzer or his office  [phone]) if you have:  Temperature greater than 100.4,  Persistent nausea and vomiting,  Severe uncontrolled pain,  Redness, tenderness, or signs of infection (pain, swelling, redness, odor or green/yellow discharge around the site),  Difficulty breathing, headache or visual disturbances,  Any other questions or concerns you may have after discharge.  In an emergency, call 911 or go to an Emergency Department at a nearby hospital.

## 2014-09-23 ENCOUNTER — Encounter: Payer: Self-pay | Admitting: Internal Medicine

## 2014-10-07 ENCOUNTER — Other Ambulatory Visit: Payer: Self-pay

## 2014-10-07 DIAGNOSIS — Z1231 Encounter for screening mammogram for malignant neoplasm of breast: Secondary | ICD-10-CM

## 2014-10-19 ENCOUNTER — Ambulatory Visit
Admission: RE | Admit: 2014-10-19 | Discharge: 2014-10-19 | Disposition: A | Discharge status: Home / Self Care | Payer: Managed Care, Other (non HMO) | Source: Ambulatory Visit

## 2014-10-19 DIAGNOSIS — Z1231 Encounter for screening mammogram for malignant neoplasm of breast: Secondary | ICD-10-CM

## 2014-12-14 ENCOUNTER — Other Ambulatory Visit: Payer: Self-pay | Admitting: Family Medicine

## 2014-12-14 DIAGNOSIS — R7989 Other specified abnormal findings of blood chemistry: Secondary | ICD-10-CM

## 2014-12-14 DIAGNOSIS — R945 Abnormal results of liver function studies: Principal | ICD-10-CM

## 2014-12-16 ENCOUNTER — Other Ambulatory Visit: Payer: Self-pay

## 2014-12-17 ENCOUNTER — Ambulatory Visit
Admission: RE | Admit: 2014-12-17 | Discharge: 2014-12-17 | Disposition: A | Discharge status: Home / Self Care | Payer: Managed Care, Other (non HMO) | Source: Ambulatory Visit | Attending: Family Medicine | Admitting: Family Medicine

## 2014-12-17 DIAGNOSIS — R945 Abnormal results of liver function studies: Principal | ICD-10-CM

## 2014-12-17 DIAGNOSIS — R7989 Other specified abnormal findings of blood chemistry: Secondary | ICD-10-CM

## 2015-06-03 ENCOUNTER — Other Ambulatory Visit: Payer: Self-pay | Admitting: Obstetrics

## 2015-06-03 DIAGNOSIS — M810 Age-related osteoporosis without current pathological fracture: Secondary | ICD-10-CM

## 2015-06-08 ENCOUNTER — Other Ambulatory Visit: Payer: Self-pay

## 2015-06-08 DIAGNOSIS — Z1231 Encounter for screening mammogram for malignant neoplasm of breast: Secondary | ICD-10-CM

## 2015-10-21 ENCOUNTER — Ambulatory Visit
Admission: RE | Admit: 2015-10-21 | Discharge: 2015-10-21 | Disposition: A | Discharge status: Home / Self Care | Payer: 59 | Source: Ambulatory Visit | Attending: Obstetrics | Admitting: Obstetrics

## 2015-10-21 ENCOUNTER — Ambulatory Visit
Admission: RE | Admit: 2015-10-21 | Discharge: 2015-10-21 | Disposition: A | Discharge status: Home / Self Care | Payer: 59 | Source: Ambulatory Visit

## 2015-10-21 DIAGNOSIS — M810 Age-related osteoporosis without current pathological fracture: Secondary | ICD-10-CM

## 2015-10-21 DIAGNOSIS — Z1231 Encounter for screening mammogram for malignant neoplasm of breast: Secondary | ICD-10-CM

## 2016-03-13 ENCOUNTER — Other Ambulatory Visit: Payer: Self-pay | Admitting: Family Medicine

## 2016-03-13 ENCOUNTER — Ambulatory Visit
Admission: RE | Admit: 2016-03-13 | Discharge: 2016-03-13 | Disposition: A | Discharge status: Home / Self Care | Payer: 59 | Source: Ambulatory Visit | Attending: Family Medicine | Admitting: Family Medicine

## 2016-03-13 DIAGNOSIS — M545 Low back pain: Secondary | ICD-10-CM

## 2016-04-19 ENCOUNTER — Other Ambulatory Visit: Payer: Self-pay | Admitting: Dermatology

## 2017-01-02 ENCOUNTER — Other Ambulatory Visit: Payer: Self-pay | Admitting: Dermatology

## 2017-06-11 ENCOUNTER — Telehealth: Payer: Self-pay

## 2017-06-11 NOTE — Telephone Encounter (Signed)
SENT REFERRAL TO SCHEDULING 

## 2017-06-20 DIAGNOSIS — I10 Essential (primary) hypertension: Secondary | ICD-10-CM | POA: Insufficient documentation

## 2017-06-20 DIAGNOSIS — R002 Palpitations: Secondary | ICD-10-CM | POA: Insufficient documentation

## 2017-06-20 DIAGNOSIS — I341 Nonrheumatic mitral (valve) prolapse: Secondary | ICD-10-CM

## 2017-06-20 DIAGNOSIS — I059 Rheumatic mitral valve disease, unspecified: Secondary | ICD-10-CM

## 2017-06-20 DIAGNOSIS — R945 Abnormal results of liver function studies: Secondary | ICD-10-CM

## 2017-06-20 DIAGNOSIS — D259 Leiomyoma of uterus, unspecified: Secondary | ICD-10-CM

## 2017-06-20 DIAGNOSIS — M545 Low back pain, unspecified: Secondary | ICD-10-CM | POA: Insufficient documentation

## 2017-06-20 DIAGNOSIS — N950 Postmenopausal bleeding: Secondary | ICD-10-CM

## 2017-06-20 DIAGNOSIS — F410 Panic disorder [episodic paroxysmal anxiety] without agoraphobia: Secondary | ICD-10-CM

## 2017-06-20 DIAGNOSIS — R0789 Other chest pain: Secondary | ICD-10-CM

## 2017-06-20 DIAGNOSIS — N951 Menopausal and female climacteric states: Secondary | ICD-10-CM

## 2017-06-20 DIAGNOSIS — R7989 Other specified abnormal findings of blood chemistry: Secondary | ICD-10-CM

## 2017-06-20 DIAGNOSIS — E785 Hyperlipidemia, unspecified: Secondary | ICD-10-CM

## 2017-06-20 DIAGNOSIS — I499 Cardiac arrhythmia, unspecified: Secondary | ICD-10-CM

## 2017-06-20 DIAGNOSIS — R079 Chest pain, unspecified: Secondary | ICD-10-CM

## 2017-06-20 DIAGNOSIS — N952 Postmenopausal atrophic vaginitis: Secondary | ICD-10-CM

## 2017-06-20 DIAGNOSIS — Z800 Family history of malignant neoplasm of digestive organs: Secondary | ICD-10-CM | POA: Insufficient documentation

## 2017-06-20 DIAGNOSIS — M810 Age-related osteoporosis without current pathological fracture: Secondary | ICD-10-CM | POA: Insufficient documentation

## 2017-06-20 DIAGNOSIS — N909 Noninflammatory disorder of vulva and perineum, unspecified: Secondary | ICD-10-CM | POA: Insufficient documentation

## 2017-06-20 HISTORY — DX: Noninflammatory disorder of vulva and perineum, unspecified: N90.9

## 2017-06-20 HISTORY — DX: Panic disorder (episodic paroxysmal anxiety): F41.0

## 2017-06-20 HISTORY — DX: Other chest pain: R07.89

## 2017-06-20 HISTORY — DX: Postmenopausal atrophic vaginitis: N95.2

## 2017-06-20 HISTORY — DX: Essential (primary) hypertension: I10

## 2017-06-20 HISTORY — DX: Nonrheumatic mitral (valve) prolapse: I34.1

## 2017-06-20 HISTORY — DX: Rheumatic mitral valve disease, unspecified: I05.9

## 2017-06-20 HISTORY — DX: Low back pain, unspecified: M54.50

## 2017-06-20 HISTORY — DX: Hyperlipidemia, unspecified: E78.5

## 2017-06-20 HISTORY — DX: Cardiac arrhythmia, unspecified: I49.9

## 2017-06-20 HISTORY — DX: Leiomyoma of uterus, unspecified: D25.9

## 2017-06-20 HISTORY — DX: Chest pain, unspecified: R07.9

## 2017-06-20 HISTORY — DX: Menopausal and female climacteric states: N95.1

## 2017-06-20 HISTORY — DX: Palpitations: R00.2

## 2017-06-20 HISTORY — DX: Family history of malignant neoplasm of digestive organs: Z80.0

## 2017-06-20 HISTORY — DX: Postmenopausal bleeding: N95.0

## 2017-06-20 HISTORY — DX: Other specified abnormal findings of blood chemistry: R79.89

## 2017-06-20 NOTE — Progress Notes (Signed)
Cardiology Office Note:    Date:  06/21/2017   ID:  Kristen Reyes, DOB 10-12-1950, MRN [id number]  PCP:  Shirline Frees, MD  Cardiologist:  Shirlee More, MD   Referring MD: Shirline Frees, MD  ASSESSMENT:    1. Palpitation   2. Chest pain in adult   3. Essential hypertension   4. Mitral valve prolapse    PLAN:    In order of problems listed above:  1. I reviewed her event monitor from 2014 with documented recurrence and frequent atrial premature beats.  At this time she does not want to repeat a medical event monitor I will switch her to a better beta-blocker for arrhythmia acebutolol and reassess in the office in 3 weeks.  We discussed avoiding over-the-counter proarrhythmic drugs 2. Atypical elements of both reflux and costochondral pain.  I asked her to consider cardiac CTA for reassurance and will discuss at office follow-up in 3 weeks.  I think she is a poor candidate for stress test modalities as she previously had an abnormal myocardial perfusion study EKG and images were normal on arteriography in the realm of 15 years ago. 3. Stable continue beta-blocker 4. Stable by physical examination at this time I do not think she requires a repeat echocardiogram without murmur  Next appointment 3 weeks   Medication Adjustments/Labs and Tests Ordered: Current medicines are reviewed at length with the patient today.  Concerns regarding medicines are outlined above.  Orders Placed This Encounter  Procedures  . EKG 12-Lead   Meds ordered this encounter  Medications  . acebutolol (SECTRAL) 200 MG capsule    Sig: Take 1 capsule (200 mg total) by mouth daily. Take 1 extra capsule (200 mg) in the evening if needed.    Dispense:  45 capsule    Refill:  11     Chief Complaint  Patient presents with  . New Patient (Initial Visit)    per Dr Kenton Kingfisher    History of Present Illness:    Kristen Reyes is a 67 y.o. female with mitral valve prolapse previously seen by Dr  Rollene Fare who is being seen today for the evaluation of palpitation at the request of Shirline Frees, MD. She has a lifelong history of palpitation that tends to wax and wane and recently worsened in the setting of her father's illness and death and physical and emotional distress.  Her symptoms are back to baseline she describes intermittent premature beat and at times brief rapid heart rhythm with fluttering.  Not severe not sustained and no associated syncope.  Symptoms can occur with physical activity.  There is a background history of click murmur and documented mitral valve prolapse.  Approximately 15 years ago with atypical chest pain she had a stress myocardial perfusion study abnormal and subsequent normal coronary angiography.  She gets intermittent chest pain which is nonanginal it is not exertional not relieved with rest elements of that are related to heartburn and indigestion improved with the PPI and elements of it are clearly costochondral which are positional sharp and localized with costochondral junction.  She has a remote history of chest trauma as a young adults.  No shortness of breath or syncope no history of congenital or rheumatic heart disease.  She takes no over-the-counter proarrhythmic medications.  She was seen by GI they recommended endoscopic procedures but wanted her seen by cardiology first.  Past Medical History:  Diagnosis Date  . Anxiety   . Atypical chest pain 06/20/2017  . Cancer (  [place])    skin cancer  . Cardiac dysrhythmia 06/20/2017  . Colostomy in place [facility])   . Complication of anesthesia   . Difficulty sleeping   . Diverticulitis   . Diverticulitis of sigmoid colon s/p colectomy.  Colostomy takedown 05/19/2014 07/25/2013  . Elevated LFTs 06/20/2017  . Essential hypertension 06/20/2017  . Family history of malignant neoplasm of gastrointestinal tract 06/20/2017  . History of nonmelanoma skin cancer   . Hyperlipidemia 06/20/2017  . Hypertension   . Leiomyoma of  uterus 06/20/2017  . Low back pain 06/20/2017  . Mitral valve disorder 06/20/2017  . MVP (mitral valve prolapse)   . Noninflammatory disorder of vulva and perineum 06/20/2017  . Osteoporosis   . Palpitation 06/20/2017  . Panic disorder 06/20/2017  . Perforated sigmoid colon ([place]) 07/25/2013  . PONV (postoperative nausea and vomiting)   . Postmenopausal atrophic vaginitis 06/20/2017  . Postmenopausal bleeding 06/20/2017  . Symptomatic menopausal or female climacteric states 06/20/2017    Past Surgical History:  Procedure Laterality Date  . CARDIAC CATHETERIZATION  2002  . COLON SURGERY    . COLOSTOMY TAKEDOWN N/A 05/19/2014   Procedure: TAKEDOWN HARTMAN COLOSTOMY;  Surgeon: Excell Seltzer, MD;  Location: [facility];  Service: General;  Laterality: N/A;  . LAPAROTOMY N/A 07/09/2013   Procedure: EXPLORATORY LAPAROTOMY, COLOSTOMY, SIGMOID COLECTOMY, HARTMANS PROCEDURE;  Surgeon: Edward Jolly, MD;  Location: [facility];  Service: General;  Laterality: N/A;  . OOPHORECTOMY      Current Medications: Current Meds  Medication Sig  . ALPRAZolam (XANAX) 0.5 MG tablet Take 0.125-0.25 mg by mouth at bedtime as needed for anxiety.  Marland Kitchen aspirin EC 81 MG tablet Take 81 mg by mouth daily.  . Multiple Vitamin (MULTIVITAMIN WITH MINERALS) TABS tablet Take 1 tablet by mouth daily.  . ranitidine (ZANTAC) 300 MG tablet 1 TABLET AT BEDTIME TWICE A DAY  . zolpidem (AMBIEN) 10 MG tablet Take 10 mg by mouth at bedtime as needed for sleep.  . [DISCONTINUED] metoprolol succinate (TOPROL-XL) 25 MG 24 hr tablet Take 37.5 mg by mouth every morning.      Allergies:   Erythromycin base; Lidocaine; and Codeine   Social History   Socioeconomic History  . Marital status: Married    Spouse name: Not on file  . Number of children: Not on file  . Years of education: Not on file  . Highest education level: Not on file  Occupational History  . Not on file  Social Needs  . Financial resource strain: Not on file  . Food  insecurity:    Worry: Not on file    Inability: Not on file  . Transportation needs:    Medical: Not on file    Non-medical: Not on file  Tobacco Use  . Smoking status: Former Smoker    Last attempt to quit: 05/14/1970    Years since quitting: 47.1  . Smokeless tobacco: Never Used  Substance and Sexual Activity  . Alcohol use: Yes    Comment: social  . Drug use: No  . Sexual activity: Not on file  Lifestyle  . Physical activity:    Days per week: Not on file    Minutes per session: Not on file  . Stress: Not on file  Relationships  . Social connections:    Talks on phone: Not on file    Gets together: Not on file    Attends religious service: Not on file    Active member of club or organization: Not  on file    Attends meetings of clubs or organizations: Not on file    Relationship status: Not on file  Other Topics Concern  . Not on file  Social History Narrative  . Not on file     Family History: The patient's family history includes Hyperlipidemia in her father and mother; Hypertension in her father and mother.  ROS:   Review of Systems  Constitution: Negative.  HENT: Negative.   Eyes: Negative.   Cardiovascular: Positive for chest pain and palpitations.  Respiratory: Negative.   Endocrine: Negative.   Hematologic/Lymphatic: Negative.   Skin: Negative.   Musculoskeletal: Negative.   Gastrointestinal: Positive for heartburn.  Genitourinary: Negative.   Neurological: Negative.   Psychiatric/Behavioral: The patient is nervous/anxious.   Allergic/Immunologic: Negative.    Please see the history of present illness.     All other systems reviewed and are negative.  EKGs/Labs/Other Studies Reviewed:    The following studies were reviewed today: Previous records including independently reviewing her event monitor from 2014  EKG:  EKG is  ordered today.  The ekg ordered today demonstrates sinus rhythm normal EKG 03/30/17 [place] normal Recent Labs:  01/31/17 CBC  CMP TSH all normal No results found for requested labs within last 8760 hours.  Recent Lipid Panel Chol 179 HDL 72 LDL 94    Component Value Date/Time   TRIG 116 07/21/2013 0355    Physical Exam:    VS:  BP 120/80 (BP Location: Left Arm, Patient Position: Sitting, Cuff Size: Small)   Pulse 66   Ht 5\' 3"  (1.6 m)   Wt 101 lb (45.8 kg)   SpO2 97%   BMI 17.89 kg/m     Wt Readings from Last 3 Encounters:  06/21/17 101 lb (45.8 kg)  05/19/14 101 lb 3 oz (45.9 kg)  05/14/14 101 lb 3 oz (45.9 kg)     GEN:  Well nourished, well developed in no acute distress HEENT: Normal NECK: No JVD; No carotid bruits LYMPHATICS: No lymphadenopathy CARDIAC: RRR, no murmurs, rubs, gallops RESPIRATORY:  Clear to auscultation without rales, wheezing or rhonchi  ABDOMEN: Soft, non-tender, non-distended MUSCULOSKELETAL:  No edema; No deformity  SKIN: Warm and dry NEUROLOGIC:  Alert and oriented x 3 PSYCHIATRIC:  Normal affect     Signed, Shirlee More, MD  06/21/2017 12:06 PM    [place]

## 2017-06-21 ENCOUNTER — Ambulatory Visit (INDEPENDENT_AMBULATORY_CARE_PROVIDER_SITE_OTHER): Payer: 59 | Admitting: Cardiology

## 2017-06-21 ENCOUNTER — Encounter: Payer: Self-pay | Admitting: Cardiology

## 2017-06-21 VITALS — BP 120/80 | HR 66 | Ht 63.0 in | Wt 101.0 lb

## 2017-06-21 DIAGNOSIS — I341 Nonrheumatic mitral (valve) prolapse: Secondary | ICD-10-CM | POA: Diagnosis not present

## 2017-06-21 DIAGNOSIS — R079 Chest pain, unspecified: Secondary | ICD-10-CM

## 2017-06-21 DIAGNOSIS — I10 Essential (primary) hypertension: Secondary | ICD-10-CM

## 2017-06-21 DIAGNOSIS — R002 Palpitations: Secondary | ICD-10-CM

## 2017-06-21 MED ORDER — ACEBUTOLOL HCL 200 MG PO CAPS: 200.0000 mg | ORAL_CAPSULE | Freq: Every day | ORAL | 11 refills | Status: DC

## 2017-06-21 NOTE — Patient Instructions (Addendum)
Medication Instructions:  Your physician has recommended you make the following change in your medication:  STOP metoprolol succinate (Toprol XL)  START acebutolol 200 mg daily. Take an extra capsule (200 mg) in the evening if needed.  Labwork: None  Testing/Procedures: You had an EKG today.  Follow-Up: Your physician recommends that you schedule a follow-up appointment in: 3 weeks.  Any Other Special Instructions Will Be Listed Below (If Applicable).     If you need a refill on your cardiac medications before your next appointment, please call your pharmacy.    1. Avoid all over-the-counter antihistamines except Claritin/Loratadine and Zyrtec/Cetrizine. 2. Avoid all combination including cold sinus allergies flu decongestant and sleep medications 3. You can use Robitussin DM Mucinex and Mucinex DM for cough. 4. can use Tylenol aspirin ibuprofen and naproxen but no combinations such as sleep or sinus.  KardiaMobile [email or web address]        FDA-cleared, clinical grade mobile EKG monitor: Jodelle Red is the most clinically-validated mobile EKG used by the world's leading cardiac care medical professionals With Basic service, know instantly if your heart rhythm is normal or if atrial fibrillation is detected, and email the last single EKG recording to yourself or your doctor Premium service, available for purchase through the Kardia app for $9.99 per month or $99 per year, includes unlimited history and storage of your EKG recordings, a monthly EKG summary report to share with your doctor, along with the ability to track your blood pressure, activity and weight Includes one KardiaMobile phone clip FREE SHIPPING: Standard delivery 1-3 business days. Orders placed by 11:00am PST will ship that afternoon. Otherwise, will ship next business day. All orders ship via [employer] from [place], [place]

## 2017-07-10 NOTE — Progress Notes (Signed)
Cardiology Office Note:    Date:  07/12/2017   ID:  Kristen Reyes, DOB 1950-06-09, MRN [id number]  PCP:  Shirline Frees, MD  Cardiologist:  Shirlee More, MD    Referring MD: Shirline Frees, MD    ASSESSMENT:    1. Palpitation   2. Mitral valve prolapse   3. Essential hypertension    PLAN:    In order of problems listed above:  1. Improved continue her current beta-blocker 2. Stable continue current treatment 3. Stable at this time I would not repeat cardiac imaging or ischemia evaluation   Next appointment: As needed   Medication Adjustments/Labs and Tests Ordered: Current medicines are reviewed at length with the patient today.  Concerns regarding medicines are outlined above.  No orders of the defined types were placed in this encounter.  No orders of the defined types were placed in this encounter.   Chief Complaint  Patient presents with  . Follow-up  . Palpitations    History of Present Illness:    Kristen Reyes is a 67 y.o. female with a hx of mitral valve prolapse chest pain and  palpitation with symptomatic APC's last seen 06/21/17.  ASSESSMENT:    06/21/17   1. Palpitation   2. Chest pain in adult   3. Essential hypertension   4. Mitral valve prolapse    PLAN:    1.   I reviewed her event monitor from 2014 with documented recurrence and frequent atrial premature beats.  At this time she does not want to repeat a medical event monitor I will switch her to a better beta-blocker for arrhythmia acebutolol and reassess in the office in 3 weeks.  We discussed avoiding over-the-counter proarrhythmic drugs 4. Atypical elements of both reflux and costochondral pain.  I asked her to consider cardiac CTA for reassurance and will discuss at office follow-up in 3 weeks.  I think she is a poor candidate for stress test modalities as she previously had an abnormal myocardial perfusion study EKG and images were normal on arteriography in the realm of 15  years ago. 5. Stable continue beta-blocker 6. Stable by physical examination at this time I do not think she requires a repeat echocardiogram without murmur  Compliance with diet, lifestyle and medications: Yes  She has improved fatigue is resolved no chest pain and no significant palpitation switching to a more selective effective beta-blocker.  At this time neither of [place] feel she requires cardiac imaging for ischemia evaluation at this time will be further follow-up my office as needed.  I reinforced to avoid over-the-counter proarrhythmic medications and if need be she can take an extra dose of her beta-blocker PRN for breakthrough arrhythmia.  In the past she has had symptomatic APCs Past Medical History:  Diagnosis Date  . Anxiety   . Atypical chest pain 06/20/2017  . Cancer ([place])    skin cancer  . Cardiac dysrhythmia 06/20/2017  . Colostomy in place [facility])   . Complication of anesthesia   . Difficulty sleeping   . Diverticulitis   . Diverticulitis of sigmoid colon s/p colectomy.  Colostomy takedown 05/19/2014 07/25/2013  . Elevated LFTs 06/20/2017  . Essential hypertension 06/20/2017  . Family history of malignant neoplasm of gastrointestinal tract 06/20/2017  . History of nonmelanoma skin cancer   . Hyperlipidemia 06/20/2017  . Hypertension   . Leiomyoma of uterus 06/20/2017  . Low back pain 06/20/2017  . Mitral valve disorder 06/20/2017  . MVP (mitral valve prolapse)   .  Noninflammatory disorder of vulva and perineum 06/20/2017  . Osteoporosis   . Palpitation 06/20/2017  . Panic disorder 06/20/2017  . Perforated sigmoid colon ([place]) 07/25/2013  . PONV (postoperative nausea and vomiting)   . Postmenopausal atrophic vaginitis 06/20/2017  . Postmenopausal bleeding 06/20/2017  . Symptomatic menopausal or female climacteric states 06/20/2017    Past Surgical History:  Procedure Laterality Date  . CARDIAC CATHETERIZATION  2002  . COLON SURGERY    . COLOSTOMY TAKEDOWN N/A 05/19/2014    Procedure: TAKEDOWN HARTMAN COLOSTOMY;  Surgeon: Excell Seltzer, MD;  Location[facility];  Service: General;  Laterality: N/A;  . LAPAROTOMY N/A 07/09/2013   Procedure: EXPLORATORY LAPAROTOMY, COLOSTOMY, SIGMOID COLECTOMY, HARTMANS PROCEDURE;  Surgeon: Edward Jolly, MD;  Location: [facility];  Service: General;  Laterality: N/A;  . OOPHORECTOMY      Current Medications: Current Meds  Medication Sig  . acebutolol (SECTRAL) 200 MG capsule Take 1 capsule (200 mg total) by mouth daily. Take 1 extra capsule (200 mg) in the evening if needed.  . ALPRAZolam (XANAX) 0.5 MG tablet Take 0.125-0.25 mg by mouth at bedtime as needed for anxiety.  Marland Kitchen aspirin EC 81 MG tablet Take 81 mg by mouth daily.  . Multiple Vitamin (MULTIVITAMIN WITH MINERALS) TABS tablet Take 1 tablet by mouth daily.  . ranitidine (ZANTAC) 300 MG tablet 1 TABLET AT BEDTIME TWICE A DAY  . zolpidem (AMBIEN) 10 MG tablet Take 10 mg by mouth at bedtime as needed for sleep.     Allergies:   Erythromycin base; Novocain [procaine]; and Codeine   Social History   Socioeconomic History  . Marital status: Married    Spouse name: Not on file  . Number of children: Not on file  . Years of education: Not on file  . Highest education level: Not on file  Occupational History  . Not on file  Social Needs  . Financial resource strain: Not on file  . Food insecurity:    Worry: Not on file    Inability: Not on file  . Transportation needs:    Medical: Not on file    Non-medical: Not on file  Tobacco Use  . Smoking status: Former Smoker    Last attempt to quit: 05/14/1970    Years since quitting: 47.1  . Smokeless tobacco: Never Used  Substance and Sexual Activity  . Alcohol use: Yes    Comment: social  . Drug use: No  . Sexual activity: Not on file  Lifestyle  . Physical activity:    Days per week: Not on file    Minutes per session: Not on file  . Stress: Not on file  Relationships  . Social connections:    Talks on  phone: Not on file    Gets together: Not on file    Attends religious service: Not on file    Active member of club or organization: Not on file    Attends meetings of clubs or organizations: Not on file    Relationship status: Not on file  Other Topics Concern  . Not on file  Social History Narrative  . Not on file     Family History: The patient's family history includes Hyperlipidemia in her father and mother; Hypertension in her father and mother. ROS:   Please see the history of present illness.    All other systems reviewed and are negative.  EKGs/Labs/Other Studies Reviewed:    The following studies were reviewed today:    Recent Labs:  No results found for requested labs within last 8760 hours.  Recent Lipid Panel    Component Value Date/Time   TRIG 116 07/21/2013 0355    Physical Exam:    VS:  BP 114/78 (BP Location: Right Arm, Patient Position: Sitting, Cuff Size: Small)   Pulse 67   Ht 5\' 3"  (1.6 m)   Wt 101 lb 12.8 oz (46.2 kg)   SpO2 96%   BMI 18.03 kg/m     Wt Readings from Last 3 Encounters:  07/12/17 101 lb 12.8 oz (46.2 kg)  06/21/17 101 lb (45.8 kg)  05/19/14 101 lb 3 oz (45.9 kg)     GEN:  Well nourished, well developed in no acute distress HEENT: Normal NECK: No JVD; No carotid bruits LYMPHATICS: No lymphadenopathy CARDIAC: RRR, no murmurs, rubs, gallops RESPIRATORY:  Clear to auscultation without rales, wheezing or rhonchi  ABDOMEN: Soft, non-tender, non-distended MUSCULOSKELETAL:  No edema; No deformity  SKIN: Warm and dry NEUROLOGIC:  Alert and oriented x 3 PSYCHIATRIC:  Normal affect    Signed, Shirlee More, MD  07/12/2017 12:47 PM    [place]

## 2017-07-12 ENCOUNTER — Encounter: Payer: Self-pay | Admitting: Cardiology

## 2017-07-12 ENCOUNTER — Ambulatory Visit (INDEPENDENT_AMBULATORY_CARE_PROVIDER_SITE_OTHER): Payer: 59 | Admitting: Cardiology

## 2017-07-12 VITALS — BP 114/78 | HR 67 | Ht 63.0 in | Wt 101.8 lb

## 2017-07-12 DIAGNOSIS — I341 Nonrheumatic mitral (valve) prolapse: Secondary | ICD-10-CM

## 2017-07-12 DIAGNOSIS — R002 Palpitations: Secondary | ICD-10-CM

## 2017-07-12 DIAGNOSIS — I10 Essential (primary) hypertension: Secondary | ICD-10-CM

## 2017-07-12 NOTE — Patient Instructions (Addendum)
Medication Instructions:  Your physician recommends that you continue on your current medications as directed. Please refer to the Current Medication list given to you today.  Labwork: None  Testing/Procedures: None  Follow-Up: Your physician recommends that you schedule a follow-up appointment as needed if symptoms worsen or fail to improve.  Any Other Special Instructions Will Be Listed Below (If Applicable).     If you need a refill on your cardiac medications before your next appointment, please call your pharmacy.    1. Avoid all over-the-counter antihistamines except Claritin/Loratadine and Zyrtec/Cetrizine. 2. Avoid all combination including cold sinus allergies flu decongestant and sleep medications 3. You can use Robitussin DM Mucinex and Mucinex DM for cough. 4. can use Tylenol aspirin ibuprofen and naproxen but no combinations such as sleep or sinus. 

## 2018-01-01 ENCOUNTER — Other Ambulatory Visit: Payer: Self-pay | Admitting: Obstetrics

## 2018-01-01 DIAGNOSIS — M810 Age-related osteoporosis without current pathological fracture: Secondary | ICD-10-CM

## 2018-01-16 ENCOUNTER — Ambulatory Visit
Admission: RE | Admit: 2018-01-16 | Discharge: 2018-01-16 | Disposition: A | Discharge status: Home / Self Care | Payer: Medicare HMO | Source: Ambulatory Visit | Attending: Obstetrics | Admitting: Obstetrics

## 2018-01-16 DIAGNOSIS — M810 Age-related osteoporosis without current pathological fracture: Secondary | ICD-10-CM

## 2018-05-10 ENCOUNTER — Other Ambulatory Visit: Payer: Self-pay | Admitting: Cardiology

## 2018-05-27 ENCOUNTER — Other Ambulatory Visit: Payer: Self-pay | Admitting: Cardiology

## 2018-09-25 ENCOUNTER — Other Ambulatory Visit: Payer: Self-pay | Admitting: Cardiology

## 2018-10-16 ENCOUNTER — Other Ambulatory Visit: Payer: Self-pay | Admitting: Cardiology

## 2018-11-14 ENCOUNTER — Other Ambulatory Visit: Payer: Self-pay | Admitting: Cardiology

## 2018-12-02 ENCOUNTER — Other Ambulatory Visit: Payer: Self-pay | Admitting: Cardiology

## 2018-12-02 ENCOUNTER — Telehealth: Payer: Self-pay | Admitting: Cardiology

## 2018-12-02 NOTE — Telephone Encounter (Signed)
Call acebutolol to [facility] on [facility]

## 2018-12-02 NOTE — Telephone Encounter (Signed)
Called patient, she has not been seen in over a year and needs a follow up appointment for further refills. Scheduled her with Laurann Montana, NP this week. She is aware of appointment place and date/time. No further questions.

## 2018-12-03 NOTE — Patient Instructions (Addendum)
Medication Instructions:  No medication changes today.   A refill of your Acebutolol has been sent to your pharmacy.   If you need a refill on your cardiac medications before your next appointment, please call your pharmacy.   Lab work: No lab work today.   If you have labs (blood work) drawn today and your tests are completely normal, you will receive your results only by: Marland Kitchen MyChart Message (if you have MyChart) OR . A paper copy in the mail If you have any lab test that is abnormal or we need to change your treatment, we will call you to review the results.  Testing/Procedures: You had an EKG. It showed normal sinus rhythm.   Follow-Up: At [facility], you and your health needs are our priority.  As part of our continuing mission to provide you with exceptional heart care, we have created designated Provider Care Teams.  These Care Teams include your primary Cardiologist (physician) and Advanced Practice Providers (APPs -  Physician Assistants and Nurse Practitioners) who all work together to provide you with the care you need, when you need it. You will need a follow up appointment in 1 years or as needed.  Please call our office 2 months in advance to schedule this appointment.  You may see Shirlee More, MD or another member of our [place] Provider Team in [place]: Jenne Campus, MD . Jyl Heinz, MD  Any Other Special Instructions Will Be Listed Below (If Applicable).  Medications and Arrhthymia 1. Avoid all over-the-counter antihistamines except Claritin/Loratadine and Zyrtec/Cetrizine. 2. Avoid all combination including cold sinus allergies flu decongestant and sleep medications 3. You can use Robitussin DM Mucinex and Mucinex DM for cough. 4. can use Tylenol aspirin ibuprofen and naproxen but no combinations such as sleep or sinus.  Kardia Device    KardiaMobile Https://[email or web address]        FDA-cleared, clinical grade  mobile EKG monitor: Jodelle Red is the most clinically-validated mobile EKG used by the world's leading cardiac care medical professionals With Basic service, know instantly if your heart rhythm is normal or if atrial fibrillation is detected, and email the last single EKG recording to yourself or your doctor Premium service, available for purchase through the Kardia app for $9.99 per month or $99 per year, includes unlimited history and storage of your EKG recordings, a monthly EKG summary report to share with your doctor, along with the ability to track your blood pressure, activity and weight Includes one KardiaMobile phone clip FREE SHIPPING: Standard delivery 1-3 business days. Orders placed by 11:00am PST will ship that afternoon. Otherwise, will ship next business day. All orders ship via [employer] from [place], [place]

## 2018-12-03 NOTE — Progress Notes (Signed)
Office Visit    Patient Name: Kristen Reyes Date of Encounter: 12/04/2018  Primary Care Provider:  Shirline Frees, MD Primary Cardiologist:  Shirlee More, MD Electrophysiologist:  None   Chief Complaint    Kristen Reyes is a 68 y.o. female with a hx of mitral valve prolapse, palpitation, hypertension, chest pain presents today for routine follow-up of palpitations, hypertension.  Past Medical History    Past Medical History:  Diagnosis Date  . Anxiety   . Atypical chest pain 06/20/2017  . Cancer ([place])    skin cancer  . Cardiac dysrhythmia 06/20/2017  . Colostomy in place [facility])   . Complication of anesthesia   . Difficulty sleeping   . Diverticulitis   . Diverticulitis of sigmoid colon s/p colectomy.  Colostomy takedown 05/19/2014 07/25/2013  . Elevated LFTs 06/20/2017  . Essential hypertension 06/20/2017  . Family history of malignant neoplasm of gastrointestinal tract 06/20/2017  . History of nonmelanoma skin cancer   . Hyperlipidemia 06/20/2017  . Hypertension   . Leiomyoma of uterus 06/20/2017  . Low back pain 06/20/2017  . Mitral valve disorder 06/20/2017  . MVP (mitral valve prolapse)   . Noninflammatory disorder of vulva and perineum 06/20/2017  . Osteoporosis   . Palpitation 06/20/2017  . Panic disorder 06/20/2017  . Perforated sigmoid colon ([place]) 07/25/2013  . PONV (postoperative nausea and vomiting)   . Postmenopausal atrophic vaginitis 06/20/2017  . Postmenopausal bleeding 06/20/2017  . Symptomatic menopausal or female climacteric states 06/20/2017   Past Surgical History:  Procedure Laterality Date  . CARDIAC CATHETERIZATION  2002  . COLON SURGERY    . COLOSTOMY TAKEDOWN N/A 05/19/2014   Procedure: TAKEDOWN HARTMAN COLOSTOMY;  Surgeon: Excell Seltzer, MD;  Location: [facility];  Service: General;  Laterality: N/A;  . LAPAROTOMY N/A 07/09/2013   Procedure: EXPLORATORY LAPAROTOMY, COLOSTOMY, SIGMOID COLECTOMY, HARTMANS PROCEDURE;  Surgeon: Edward Jolly,  MD;  Location: [facility];  Service: General;  Laterality: N/A;  . OOPHORECTOMY      Allergies  Allergies  Allergen Reactions  . Erythromycin Base Nausea Only    stomach pains   . Novocain [Procaine] Other (See Comments)    "makes me flip out", rapid heart rate, hot flashes  . Codeine Itching and Rash    History of Present Illness    Kristen Reyes is a 68 y.o. female with a hx of mitral valve prolapse, palpitations (APCs), hypertension, chest pain in adult last seen by Dr. Bettina Gavia 07/12/2017.  She had an event monitor 2014 with frequent atrial premature beats.  She has been managed on Acebutolol.  She has noted history of atypical chest pain with elements of reflux and/or costochondral pain, considered cardiac CTA but as symptoms resolved no further evaluation was warranted.   Very pleasant lady. Tells me she enjoys walking her dog in her free time. She walks 1 mile per day [place] with no chest pain, DOE. Reports her palpitations have been under good control. She takes her Acebutolol daily and tells me she has taken an extra capsule in the evening for palpitations 3-4 times over the last year.   She monitors her caffeine intake carefully, does not smoke, does not drink, avoids over the counter medications.   Tells me she gets chest pain every once in awhile after she eats or if she is very tired. Describes it as a "discomfort" occurring less than once per month. She has started taking a PPI recommended by her PCP and  notices some improvement. We discussed that her EKG is without ischemic changes and that this discomfort is very atypical for anginal pain. She tells me she did not think it was from her heart. We discussed that if she has new symptoms or becomes concerned we could do a cardiac CTA.  EKGs/Labs/Other Studies Reviewed:   The following studies were reviewed today:  EKG:  EKG is ordered today.  The ekg ordered today demonstrates SR rate 68 bpm with  incomplete RBBB.  Recent Labs: No results found for requested labs within last 8760 hours.  Recent Lipid Panel    Component Value Date/Time   TRIG 116 07/21/2013 0355    Home Medications   Current Meds  Medication Sig  . acebutolol (SECTRAL) 200 MG capsule TAKE 1 CAPSULE BY MOUTH DAILY. TAKE 1 EXTRA CAPSULE (200 MG) IN THE EVENING IF NEEDED  . ALPRAZolam (XANAX) 0.5 MG tablet Take 0.125-0.25 mg by mouth at bedtime as needed for anxiety.  . Multiple Vitamin (MULTIVITAMIN WITH MINERALS) TABS tablet Take 1 tablet by mouth daily.  . ranitidine (ZANTAC) 300 MG tablet 1 TABLET AT BEDTIME TWICE A DAY  . zolpidem (AMBIEN) 10 MG tablet Take 10 mg by mouth at bedtime as needed for sleep.  . [DISCONTINUED] acebutolol (SECTRAL) 200 MG capsule TAKE 1 CAPSULE BY MOUTH DAILY. TAKE 1 EXTRA CAPSULE (200 MG) IN THE EVENING IF NEEDED    Review of Systems      Review of Systems  Constitution: Negative for chills, fever and malaise/fatigue.  Cardiovascular: Positive for palpitations (intermittent). Negative for chest pain, dyspnea on exertion, irregular heartbeat, leg swelling, near-syncope and orthopnea.  Respiratory: Negative for cough, shortness of breath and wheezing.   Gastrointestinal: Negative for nausea and vomiting.  Neurological: Negative for dizziness, light-headedness and weakness.   All other systems reviewed and are otherwise negative except as noted above.  Physical Exam    VS:  BP 132/76 (BP Location: Left Arm, Patient Position: Sitting)   Pulse 70   Temp 97.6 F (36.4 C)   Ht 5\' 3"  (1.6 m)   Wt 97 lb (44 kg)   SpO2 97%   BMI 17.18 kg/m  , BMI Body mass index is 17.18 kg/m. GEN: Well nourished, well developed, in no acute distress. HEENT: normal. Neck: Supple, no JVD, carotid bruits, or masses. Cardiac: RRR, no murmurs, rubs, or gallops. No clubbing, cyanosis, edema.  Radials/DP/PT 2+ and equal bilaterally.  Respiratory:  Respirations regular and unlabored, clear to  auscultation bilaterally. GI: Soft, nontender, nondistended, BS + x 4. MS: No deformity or atrophy. Skin: Warm and dry, no rash. Neuro:  Strength and sensation are intact. Psych: Normal affect.  Assessment & Plan    1. Palpitations- Known history of PACs on monitor 2014. Reports palpitations are under good control. Continue Acebutolol. Aovids caffeine, does not smoke, no etoh, no proarrthymic medications. Provided a list of proarrthymic medications. Discussed that if we continue to fill this medication she will need an appointment annually, she is agreeable. Her PCP may take over management, if she wishes.   2. PACs- Stable. Continue Acebutolol, as above. EKG annually.  3. Mitral valve prolapse- Noted history. No murmur on exam. No DOE, SOB. No indication for echocardiogram at this time.  4. Hypertension- Stable. Follows with her PCP. Continue regular exercise and heart healthy diet.   5. Chest discomfort - Reports occasional discomfort after eating or after hard work outdoors. Occurs infrequently, does not radiate, no SOB nor diaphoresis. EKG today NSR.  Has improved some since addition of PPI by PCP. Likely etiology GERD/costochondral. No indication for ischemic evaluation.   Disposition: Follow up in 1 year(s) with Dr. Earney Navy, NP 12/04/2018, 6:01 PM

## 2018-12-04 ENCOUNTER — Ambulatory Visit (INDEPENDENT_AMBULATORY_CARE_PROVIDER_SITE_OTHER): Payer: 59 | Admitting: Family

## 2018-12-04 ENCOUNTER — Encounter: Payer: Self-pay | Admitting: Family

## 2018-12-04 ENCOUNTER — Other Ambulatory Visit: Payer: Self-pay

## 2018-12-04 VITALS — BP 132/76 | HR 70 | Temp 97.6°F | Ht 63.0 in | Wt 97.0 lb

## 2018-12-04 DIAGNOSIS — I491 Atrial premature depolarization: Secondary | ICD-10-CM | POA: Diagnosis not present

## 2018-12-04 DIAGNOSIS — I341 Nonrheumatic mitral (valve) prolapse: Secondary | ICD-10-CM

## 2018-12-04 DIAGNOSIS — R002 Palpitations: Secondary | ICD-10-CM | POA: Diagnosis not present

## 2018-12-04 DIAGNOSIS — I10 Essential (primary) hypertension: Secondary | ICD-10-CM

## 2018-12-04 MED ORDER — ACEBUTOLOL HCL 200 MG PO CAPS: ORAL_CAPSULE | ORAL | 3 refills | Status: DC

## 2019-03-22 ENCOUNTER — Ambulatory Visit: Payer: Managed Care, Other (non HMO) | Attending: Internal Medicine

## 2019-03-22 DIAGNOSIS — Z23 Encounter for immunization: Secondary | ICD-10-CM | POA: Insufficient documentation

## 2019-03-22 NOTE — Progress Notes (Signed)
   Covid-19 Vaccination Clinic  Name:  Kristen Reyes    MRN: [id number] DOB: 1951/01/14  03/22/2019  Kristen Reyes was observed post Covid-19 immunization for 15 minutes without incidence. She was provided with Vaccine Information Sheet and instruction to access the V-Safe system.   Kristen Reyes was instructed to call 911 with any severe reactions post vaccine: Marland Kitchen Difficulty breathing  . Swelling of your face and throat  . A fast heartbeat  . A bad rash all over your body  . Dizziness and weakness    Immunizations Administered    Name Date Dose VIS Date Route   Pfizer COVID-19 Vaccine 03/22/2019 12:46 PM 0.3 mL 02/07/2019 Intramuscular   Manufacturer: [place]   Lot: [id number]   [place]: [id number]

## 2019-04-12 ENCOUNTER — Ambulatory Visit: Payer: Managed Care, Other (non HMO) | Attending: Internal Medicine

## 2019-04-12 DIAGNOSIS — Z23 Encounter for immunization: Secondary | ICD-10-CM

## 2019-04-12 NOTE — Progress Notes (Signed)
   Covid-19 Vaccination Clinic  Name:  Kristen Reyes    MRN: [id number] DOB: 1950-06-24  04/12/2019  [name] was observed post Covid-19 immunization for 15 minutes without incidence. She was provided with Vaccine Information Sheet and instruction to access the V-Safe system.   [name] was instructed to call 911 with any severe reactions post vaccine: Marland Kitchen Difficulty breathing  . Swelling of your face and throat  . A fast heartbeat  . A bad rash all over your body  . Dizziness and weakness    Immunizations Administered    Name Date Dose VIS Date Route   Pfizer COVID-19 Vaccine 04/12/2019 12:14 PM 0.3 mL 02/07/2019 Intramuscular   Manufacturer: [place]   Lot: [id number]   [place]: [id number]

## 2019-06-09 NOTE — Progress Notes (Signed)
Cardiology Office Note:    Date:  06/10/2019   ID:  Estanislado Emms, DOB 07-03-1950, MRN [id number]  PCP:  Shirline Frees, MD  Cardiologist:  Shirlee More, MD    Referring MD: Shirline Frees, MD    ASSESSMENT:    1. Palpitations   2. Essential hypertension   3. Mitral valve prolapse    PLAN:    In order of problems listed above:  1.  stable continue beta-blocker and plan as outlined under history 2. Blood pressure at target continue low-dose beta 3. Stable clinically I do not think she requires repeat echocardiogram   Next appointment: 1 year   Medication Adjustments/Labs and Tests Ordered: Current medicines are reviewed at length with the patient today.  Concerns regarding medicines are outlined above.  No orders of the defined types were placed in this encounter.  No orders of the defined types were placed in this encounter.   Chief Complaint  Patient presents with  . Follow-up    With mitral valve prolapse  . Palpitations    History of Present Illness:    Kristen Reyes is a 69 y.o. female with a hx of mitral valve prolapse, palpitation, hypertension and RBBB  last seen 12/04/2018.  Previous monitor showed frequent APCs and she has been kept on a low-dose of a selective beta-blocker. Compliance with diet, lifestyle and medications: Yes  She is seen because about a month ago she had a flare of symptoms with palpitation almost went to the emergency room take an extra dose of beta-blocker and resolved.  She has had respiratory symptoms and is using Flonase she says at times it is a burning in her chest not exertional and she is improved.  She also has chest wall tenderness that is nonexertional in nature.  All this upset her balance we discussed doing an ischemia evaluation and she does not feel the need at this time.  Her palpitation is resolved.  I repeated an EKG in the office that is reassuring sinus rhythm no ischemic changes.  We will continue beta-blocker  and she will take an extra dose as needed avoid over-the-counter proarrhythmic drugs and I asked her to purchase the iPhone adapter so that if she has another episode she can check to see if she is having heart rhythm like atrial fibrillation that would prompt urgent evaluation and she could send strips for my chart to the fact Past Medical History:  Diagnosis Date  . Anxiety   . Atypical chest pain 06/20/2017  . Cancer ([place])    skin cancer  . Cardiac dysrhythmia 06/20/2017  . Colostomy in place [facility])   . Complication of anesthesia   . Difficulty sleeping   . Diverticulitis   . Diverticulitis of sigmoid colon s/p colectomy.  Colostomy takedown 05/19/2014 07/25/2013  . Elevated LFTs 06/20/2017  . Essential hypertension 06/20/2017  . Family history of malignant neoplasm of gastrointestinal tract 06/20/2017  . History of nonmelanoma skin cancer   . Hyperlipidemia 06/20/2017  . Hypertension   . Leiomyoma of uterus 06/20/2017  . Low back pain 06/20/2017  . Mitral valve disorder 06/20/2017  . MVP (mitral valve prolapse)   . Noninflammatory disorder of vulva and perineum 06/20/2017  . Osteoporosis   . Palpitation 06/20/2017  . Panic disorder 06/20/2017  . Perforated sigmoid colon ([place]) 07/25/2013  . PONV (postoperative nausea and vomiting)   . Postmenopausal atrophic vaginitis 06/20/2017  . Postmenopausal bleeding 06/20/2017  . Symptomatic menopausal or female climacteric states 06/20/2017  Past Surgical History:  Procedure Laterality Date  . CARDIAC CATHETERIZATION  2002  . COLON SURGERY    . COLOSTOMY TAKEDOWN N/A 05/19/2014   Procedure: TAKEDOWN HARTMAN COLOSTOMY;  Surgeon: Excell Seltzer, MD;  Location[facility];  Service: General;  Laterality: N/A;  . LAPAROTOMY N/A 07/09/2013   Procedure: EXPLORATORY LAPAROTOMY, COLOSTOMY, SIGMOID COLECTOMY, HARTMANS PROCEDURE;  Surgeon: Edward Jolly, MD;  Location: [facility];  Service: General;  Laterality: N/A;  . OOPHORECTOMY      Current  Medications: Current Meds  Medication Sig  . acebutolol (SECTRAL) 200 MG capsule TAKE 1 CAPSULE BY MOUTH DAILY. TAKE 1 EXTRA CAPSULE (200 MG) IN THE EVENING IF NEEDED  . ALPRAZolam (XANAX) 0.5 MG tablet Take 0.125-0.25 mg by mouth at bedtime as needed for anxiety.  . famotidine (PEPCID) 10 MG tablet Take 10 mg by mouth daily.  . Multiple Vitamin (MULTIVITAMIN WITH MINERALS) TABS tablet Take 1 tablet by mouth daily.  Marland Kitchen zolpidem (AMBIEN) 10 MG tablet Take 10 mg by mouth at bedtime as needed for sleep.     Allergies:   Erythromycin base, Novocain [procaine], and Codeine   Social History   Socioeconomic History  . Marital status: Married    Spouse name: Not on file  . Number of children: Not on file  . Years of education: Not on file  . Highest education level: Not on file  Occupational History  . Not on file  Tobacco Use  . Smoking status: Former Smoker    Quit date: 05/14/1970    Years since quitting: 49.1  . Smokeless tobacco: Never Used  Substance and Sexual Activity  . Alcohol use: Yes    Comment: social  . Drug use: No  . Sexual activity: Not on file  Other Topics Concern  . Not on file  Social History Narrative  . Not on file   Social Determinants of Health   Financial Resource Strain:   . Difficulty of Paying Living Expenses:   Food Insecurity:   . Worried About Charity fundraiser in the Last Year:   . Arboriculturist in the Last Year:   Transportation Needs:   . Film/video editor (Medical):   Marland Kitchen Lack of Transportation (Non-Medical):   Physical Activity:   . Days of Exercise per Week:   . Minutes of Exercise per Session:   Stress:   . Feeling of Stress :   Social Connections:   . Frequency of Communication with Friends and Family:   . Frequency of Social Gatherings with Friends and Family:   . Attends Religious Services:   . Active Member of Clubs or Organizations:   . Attends Archivist Meetings:   Marland Kitchen Marital Status:      Family  History: The patient's family history includes Hyperlipidemia in her father and mother; Hypertension in her father and mother. ROS:   Please see the history of present illness.    All other systems reviewed and are negative.  EKGs/Labs/Other Studies Reviewed:    The following studies were reviewed today:  EKG:  EKG 12/17/2018 when she was seen by Laurann Montana nurse practitioner showed sinus rhythm that EKG was normal.  Recent Labs: 06/03/2019 cholesterol 176 triglycerides 80 HDL 63 LDL 99, good lipid profile TSH normal 2.77 Creatinine normal 0.76.  Physical Exam:    VS:  BP 118/80   Pulse 72   Ht 5\' 3"  (1.6 m)   Wt 101 lb (45.8 kg)   SpO2 99%  BMI 17.89 kg/m     Wt Readings from Last 3 Encounters:  06/10/19 101 lb (45.8 kg)  12/04/18 97 lb (44 kg)  07/12/17 101 lb 12.8 oz (46.2 kg)     GEN:  Well nourished, well developed in no acute distress HEENT: Normal NECK: No JVD; No carotid bruits LYMPHATICS: No lymphadenopathy CARDIAC: RRR, no murmurs, rubs, gallops RESPIRATORY:  Clear to auscultation without rales, wheezing or rhonchi  ABDOMEN: Soft, non-tender, non-distended MUSCULOSKELETAL:  No edema; No deformity  SKIN: Warm and dry NEUROLOGIC:  Alert and oriented x 3 PSYCHIATRIC:  Normal affect    Signed, Shirlee More, MD  06/10/2019 10:21 AM    [place]

## 2019-06-10 ENCOUNTER — Ambulatory Visit: Payer: 59 | Admitting: Cardiology

## 2019-06-10 ENCOUNTER — Other Ambulatory Visit: Payer: Self-pay

## 2019-06-10 ENCOUNTER — Encounter: Payer: Self-pay | Admitting: Cardiology

## 2019-06-10 VITALS — BP 118/80 | HR 72 | Ht 63.0 in | Wt 101.0 lb

## 2019-06-10 DIAGNOSIS — I341 Nonrheumatic mitral (valve) prolapse: Secondary | ICD-10-CM

## 2019-06-10 DIAGNOSIS — R002 Palpitations: Secondary | ICD-10-CM | POA: Diagnosis not present

## 2019-06-10 DIAGNOSIS — I10 Essential (primary) hypertension: Secondary | ICD-10-CM | POA: Diagnosis not present

## 2019-06-10 NOTE — Addendum Note (Signed)
Addended by: Resa Miner I on: 06/10/2019 01:41 PM   Modules accepted: Orders

## 2019-06-10 NOTE — Patient Instructions (Addendum)
Medication Instructions:  Your physician recommends that you continue on your current medications as directed. Please refer to the Current Medication list given to you today.  *If you need a refill on your cardiac medications before your next appointment, please call your pharmacy*   Lab Work: None If you have labs (blood work) drawn today and your tests are completely normal, you will receive your results only by: Marland Kitchen MyChart Message (if you have MyChart) OR . A paper copy in the mail If you have any lab test that is abnormal or we need to change your treatment, we will call you to review the results.   Testing/Procedures: None   Follow-Up: At [facility], you and your health needs are our priority.  As part of our continuing mission to provide you with exceptional heart care, we have created designated Provider Care Teams.  These Care Teams include your primary Cardiologist (physician) and Advanced Practice Providers (APPs -  Physician Assistants and Nurse Practitioners) who all work together to provide you with the care you need, when you need it.  We recommend signing up for the patient portal called "MyChart".  Sign up information is provided on this After Visit Summary.  MyChart is used to connect with patients for Virtual Visits (Telemedicine).  Patients are able to view lab/test results, encounter notes, upcoming appointments, etc.  Non-urgent messages can be sent to your provider as well.   To learn more about what you can do with MyChart, go to [email or web address].    Your next appointment:   1 year(s)  The format for your next appointment:   In Person  Provider:   Shirlee More, MD   Other Instructions 1. Avoid all over-the-counter antihistamines except Claritin/Loratadine and Zyrtec/Cetrizine. 2. Avoid all combination including cold sinus allergies flu decongestant and sleep medications 3. You can use Robitussin DM Mucinex and Mucinex DM for cough. 4. can use  Tylenol aspirin ibuprofen and naproxen but no combinations such as sleep or sinus.  Please purchase the [employer] for your smartphone off of amazon.

## 2019-06-26 ENCOUNTER — Other Ambulatory Visit: Payer: Self-pay

## 2019-06-26 ENCOUNTER — Ambulatory Visit
Admission: RE | Admit: 2019-06-26 | Discharge: 2019-06-26 | Disposition: A | Discharge status: Home / Self Care | Payer: 59 | Source: Ambulatory Visit | Attending: Family Medicine | Admitting: Family Medicine

## 2019-06-26 ENCOUNTER — Other Ambulatory Visit: Payer: Self-pay | Admitting: Family Medicine

## 2019-06-26 DIAGNOSIS — R0789 Other chest pain: Secondary | ICD-10-CM

## 2019-12-10 ENCOUNTER — Other Ambulatory Visit: Payer: Self-pay | Admitting: Family

## 2019-12-10 DIAGNOSIS — I491 Atrial premature depolarization: Secondary | ICD-10-CM

## 2019-12-13 ENCOUNTER — Ambulatory Visit: Payer: 59

## 2020-02-11 ENCOUNTER — Other Ambulatory Visit: Payer: Self-pay

## 2020-02-11 ENCOUNTER — Other Ambulatory Visit: Payer: Self-pay | Admitting: Family Medicine

## 2020-02-11 ENCOUNTER — Ambulatory Visit
Admission: RE | Admit: 2020-02-11 | Discharge: 2020-02-11 | Disposition: A | Discharge status: Home / Self Care | Payer: 59 | Source: Ambulatory Visit | Attending: Family Medicine | Admitting: Family Medicine

## 2020-02-11 DIAGNOSIS — M542 Cervicalgia: Secondary | ICD-10-CM

## 2020-02-19 ENCOUNTER — Telehealth: Payer: Self-pay | Admitting: Hematology

## 2020-02-19 NOTE — Telephone Encounter (Signed)
Received a new pt referral from Dr. Kenton Kingfisher for lymphocytopenia. Pt returned my call and has been scheduled to see Dr. Irene Limbo on 1/6 at 11am. Pt aware to arrive 30 minutes early.

## 2020-03-04 ENCOUNTER — Inpatient Hospital Stay: Payer: 59

## 2020-03-04 ENCOUNTER — Inpatient Hospital Stay: Payer: 59 | Attending: Hematology | Admitting: Hematology

## 2020-03-04 ENCOUNTER — Other Ambulatory Visit: Payer: Self-pay | Admitting: *Deleted

## 2020-03-04 ENCOUNTER — Other Ambulatory Visit: Payer: Self-pay

## 2020-03-04 VITALS — BP 164/82 | HR 69 | Temp 97.8°F | Resp 14 | Ht 63.0 in | Wt 101.2 lb

## 2020-03-04 DIAGNOSIS — D72819 Decreased white blood cell count, unspecified: Secondary | ICD-10-CM | POA: Diagnosis not present

## 2020-03-04 DIAGNOSIS — D72810 Lymphocytopenia: Secondary | ICD-10-CM | POA: Diagnosis present

## 2020-03-04 DIAGNOSIS — Z87891 Personal history of nicotine dependence: Secondary | ICD-10-CM | POA: Diagnosis not present

## 2020-03-04 LAB — CBC WITH DIFFERENTIAL/PLATELET
Abs Immature Granulocytes: 0.01 10*3/uL (ref 0.00–0.07)
Basophils Absolute: 0 10*3/uL (ref 0.0–0.1)
Basophils Relative: 1 %
Eosinophils Absolute: 0.1 10*3/uL (ref 0.0–0.5)
Eosinophils Relative: 1 %
HCT: 39.5 % (ref 36.0–46.0)
Hemoglobin: 13.2 g/dL (ref 12.0–15.0)
Immature Granulocytes: 0 %
Lymphocytes Relative: 39 %
Lymphs Abs: 1.6 10*3/uL (ref 0.7–4.0)
MCH: 29.7 pg (ref 26.0–34.0)
MCHC: 33.4 g/dL (ref 30.0–36.0)
MCV: 88.8 fL (ref 80.0–100.0)
Monocytes Absolute: 0.4 10*3/uL (ref 0.1–1.0)
Monocytes Relative: 8 %
Neutro Abs: 2.1 10*3/uL (ref 1.7–7.7)
Neutrophils Relative %: 51 %
Platelets: 179 10*3/uL (ref 150–400)
RBC: 4.45 MIL/uL (ref 3.87–5.11)
RDW: 12.4 % (ref 11.5–15.5)
WBC: 4.2 10*3/uL (ref 4.0–10.5)
nRBC: 0 % (ref 0.0–0.2)

## 2020-03-04 LAB — CMP (CANCER CENTER ONLY)
ALT: 42 U/L (ref 0–44)
AST: 40 U/L (ref 15–41)
Albumin: 4.1 g/dL (ref 3.5–5.0)
Alkaline Phosphatase: 49 U/L (ref 38–126)
Anion gap: 8 (ref 5–15)
BUN: 17 mg/dL (ref 8–23)
CO2: 26 mmol/L (ref 22–32)
Calcium: 9.4 mg/dL (ref 8.9–10.3)
Chloride: 107 mmol/L (ref 98–111)
Creatinine: 0.8 mg/dL (ref 0.44–1.00)
GFR, Estimated: >60 mL/min (ref >60)
Glucose, Bld: 95 mg/dL (ref 70–99)
Potassium: 3.9 mmol/L (ref 3.5–5.1)
Sodium: 141 mmol/L (ref 135–145)
Total Bilirubin: 0.7 mg/dL (ref 0.3–1.2)
Total Protein: 7.2 g/dL (ref 6.5–8.1)

## 2020-03-04 LAB — LACTATE DEHYDROGENASE: LDH: 184 U/L (ref 98–192)

## 2020-03-04 LAB — HIV ANTIBODY (ROUTINE TESTING W REFLEX): HIV Screen 4th Generation wRfx: NONREACTIVE

## 2020-03-04 LAB — VITAMIN B12: Vitamin B-12: 395 pg/mL (ref 180–914)

## 2020-03-04 LAB — HEPATITIS C ANTIBODY: HCV Ab: NONREACTIVE

## 2020-03-04 NOTE — Patient Instructions (Signed)
Thank you for choosing [facility] to provide your oncology and hematology care.   Should you have questions after your visit to the [facility] ([facility]), please contact this office at [phone] between 8:30 AM and 4:30 PM.  Voice mails left after 4:00 PM may not be returned until the following business day.  Calls received after 4:30 PM will be answered by an off-site Nurse Triage Line.    Prescription Refills:  Please have your pharmacy contact us directly for most prescription requests.  Contact the office directly for refills of narcotics (pain medications). Allow 48-72 hours for refills.  Appointments: Please contact the [facility] scheduling department [phone] for questions regarding [facility] appointment scheduling.  Contact the schedulers with any scheduling changes so that your appointment can be rescheduled in a timely manner.   Central Scheduling for [place] [phone] - Call to schedule procedures such as PET scans, CT scans, MRI, Ultrasound, etc.  To afford each patient quality time with our providers, please arrive 30 minutes before your scheduled appointment time.  If you arrive late for your appointment, you may be asked to reschedule.  We strive to give you quality time with our providers, and arriving late affects you and other patients whose appointments are after yours. If you are a no show for multiple scheduled visits, you may be dismissed from the clinic at the providers discretion.     Resources: [facility] Social Workers [phone] for additional information on assistance programs or assistance connecting with community support programs   [place] DSS  336-641-3447: Information regarding food stamps, Medicaid, and utility assistance [place] Access [place] 336-333-6589   [place] [employer]'s shared-ride transportation service for eligible riders who have a disability that prevents them from riding the fixed route bus.   Medicare  Rights Center 800-333-4114 Helps people with Medicare understand their rights and benefits, navigate the Medicare system, and secure the quality healthcare they deserve [employer] 800-227-2345 Assists patients locate various types of support and financial assistance Cancer Care: 1-800-813-HOPE (4673) Provides financial assistance, online support groups, medication/co-pay assistance.   Transportation Assistance for appointments at [facility]: Transportation Coordinator [phone]  Again, thank you for choosing [facility] for your care.       

## 2020-03-04 NOTE — Progress Notes (Signed)
HEMATOLOGY/ONCOLOGY CONSULTATION NOTE  Date of Service: 03/04/2020  Patient Care Team: Shirline Frees, MD as PCP - General (Family Medicine) Bettina Gavia Hilton Cork, MD as PCP - Cardiology (Cardiology)  CHIEF COMPLAINTS/PURPOSE OF CONSULTATION:  Lymphopenia  HISTORY OF PRESENTING ILLNESS:   Kristen Reyes is a wonderful 70 y.o. female who has been referred to [place] by Dr. Kenton Kingfisher for evaluation and management of lymphopenia. The pt reports that she is doing well overall.   The pt reports that in winter of 2020 she was experiencing inflammation in the chest that was thought to be caused by acid reflux. This was treated with off-brand Famotidine, which resolved her symptoms. 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 She has previously had issues with Diverticulitis for which she has had a Colostomy & Reverse Colostomy. Pt is unaware of any liver disease but has had a history of one chronically elevated liver enzyme. She has undergone Hepatitis testing, which was negative. She is unsure if she has ever required blood products but has no tattoos. She denies any frequent, recurrent, or especially significant infections. Pt has some neck discomfort which she is treating with a fair amount of Acetaminophen. Pt has no dietary restrictions or gluten intolerance. Pt has been using Vitamin B and D gummy supplements for over a year. She has not used steroids in the last year for any reason at all. Pt has a long history of difficulty sleeping. She attributes her fatigue to aging. She denies any travel outside of the country.   Most recent lab results (02/15/2020) of CBC w/diff & CMP is as follows: all values are WNL except for WBC at 2.6K, Neutro Abs at 1.3K, Lymphs Abs at 1.00K, Mono Abs at 0.2K, Glucose at 100, AST at 49.  On review of systems, pt reports fatigue, sleeplessness and denies fevers, chills, night sweats, unexpected weight loss, rash, new lumps/bumps, abdominal  pain and any other symptoms.   On PMHx the pt reports PVCs, Diverticulitis, Non-melanoma skin cancer, Colostomy Takedown. On Social Hx the pt reports that she quit smoking over 50 years ago. She currently drinks a glass of wine with dinner but admits 1-2 years of heavier drinking. Pt denies any vaping.  On Family Hx the pt reports that her sister also has a history of elevated liver enzymes. Her grandmother had RA.   MEDICAL HISTORY:  Past Medical History:  Diagnosis Date  . Anxiety   . Atypical chest pain 06/20/2017  . Cancer ([place])    skin cancer  . Cardiac dysrhythmia 06/20/2017  . Colostomy in place [facility])   . Complication of anesthesia   . Difficulty sleeping   . Diverticulitis   . Diverticulitis of sigmoid colon s/p colectomy.  Colostomy takedown 05/19/2014 07/25/2013  . Elevated LFTs 06/20/2017  . Essential hypertension 06/20/2017  . Family history of malignant neoplasm of gastrointestinal tract 06/20/2017  . History of nonmelanoma skin cancer   . Hyperlipidemia 06/20/2017  . Hypertension   . Leiomyoma of uterus 06/20/2017  . Low back pain 06/20/2017  . Mitral valve disorder 06/20/2017  . MVP (mitral valve prolapse)   . Noninflammatory disorder of vulva and perineum 06/20/2017  . Osteoporosis   . Palpitation 06/20/2017  . Panic disorder 06/20/2017  . Perforated sigmoid colon ([place]) 07/25/2013  . PONV (postoperative nausea and vomiting)   . Postmenopausal atrophic vaginitis 06/20/2017  . Postmenopausal bleeding 06/20/2017  . Symptomatic menopausal or female climacteric states 06/20/2017  SURGICAL HISTORY: Past Surgical History:  Procedure Laterality Date  . CARDIAC CATHETERIZATION  2002  . COLON SURGERY    . COLOSTOMY TAKEDOWN N/A 05/19/2014   Procedure: TAKEDOWN HARTMAN COLOSTOMY;  Surgeon: Excell Seltzer, MD;  Location: [facility];  Service: General;  Laterality: N/A;  . LAPAROTOMY N/A 07/09/2013   Procedure: EXPLORATORY LAPAROTOMY, COLOSTOMY, SIGMOID COLECTOMY, HARTMANS PROCEDURE;   Surgeon: Edward Jolly, MD;  Location: [facility];  Service: General;  Laterality: N/A;  . OOPHORECTOMY      SOCIAL HISTORY: Social History   Socioeconomic History  . Marital status: Married    Spouse name: Not on file  . Number of children: Not on file  . Years of education: Not on file  . Highest education level: Not on file  Occupational History  . Not on file  Tobacco Use  . Smoking status: Former Smoker    Quit date: 05/14/1970    Years since quitting: 49.8  . Smokeless tobacco: Never Used  Vaping Use  . Vaping Use: Never used  Substance and Sexual Activity  . Alcohol use: Yes    Comment: social  . Drug use: No  . Sexual activity: Not on file  Other Topics Concern  . Not on file  Social History Narrative  . Not on file   Social Determinants of Health   Financial Resource Strain: Not on file  Food Insecurity: Not on file  Transportation Needs: Not on file  Physical Activity: Not on file  Stress: Not on file  Social Connections: Not on file  Intimate Partner Violence: Not on file    FAMILY HISTORY: Family History  Problem Relation Age of Onset  . Hyperlipidemia Mother   . Hypertension Mother   . Hyperlipidemia Father   . Hypertension Father     ALLERGIES:  is allergic to erythromycin, erythromycin base, novocain [procaine], and codeine.  MEDICATIONS:  Current Outpatient Medications  Medication Sig Dispense Refill  . acebutolol (SECTRAL) 200 MG capsule TAKE 1 CAPSULE BY MOUTH DAILY. TAKE 1 EXTRA CAPSULE (200 MG) IN THE EVENING IF NEEDED 180 capsule 1  . acebutolol (SECTRAL) 200 MG capsule 1 capsule (Patient not taking: Reported on 03/04/2020)    . ALPRAZolam (XANAX) 0.5 MG tablet Take 0.125-0.25 mg by mouth at bedtime as needed for anxiety.    Marland Kitchen aspirin EC 81 MG tablet 1 tablet    . esomeprazole (NEXIUM) 20 MG capsule 2 capsules    . imiquimod (ALDARA) 5 % cream Apply topically.    . Multiple Vitamin (MULTIVITAMIN WITH MINERALS) TABS tablet Take 1  tablet by mouth daily.    . simethicone (GAS RELIEF) 80 MG chewable tablet 1 tablet after meals and at bedtime as needed    . zolpidem (AMBIEN) 10 MG tablet Take 10 mg by mouth at bedtime as needed for sleep.     No current facility-administered medications for this visit.    REVIEW OF SYSTEMS:    10 Point review of Systems was done is negative except as noted above.  PHYSICAL EXAMINATION: ECOG PERFORMANCE STATUS: 0 - Asymptomatic  . Vitals:   03/04/20 1114  BP: (!) 164/82  Pulse: 69  Resp: 14  Temp: 97.8 F (36.6 C)  SpO2: 100%   Filed Weights   03/04/20 1114  Weight: 101 lb 3.2 oz (45.9 kg)   .Body mass index is 17.93 kg/m.  GENERAL:alert, in no acute distress and comfortable SKIN: no acute rashes, no significant lesions EYES: conjunctiva are pink and non-injected, sclera anicteric  OROPHARYNX: MMM, no exudates, no oropharyngeal erythema or ulceration NECK: supple, no JVD LYMPH:  no palpable lymphadenopathy in the cervical, axillary or inguinal regions LUNGS: clear to auscultation b/l with normal respiratory effort HEART: regular rate & rhythm ABDOMEN:  normoactive bowel sounds , non tender, not distended. Extremity: no pedal edema PSYCH: alert & oriented x 3 with fluent speech NEURO: no focal motor/sensory deficits  LABORATORY DATA:  I have reviewed the data as listed  . CBC Latest Ref Rng & Units 03/04/2020 03/04/2020 05/22/2014  WBC 4.0 - 10.5 K/uL 4.2 - 3.8(L)  Hemoglobin 12.0 - 15.0 g/dL 13.2 - 10.6(L)  Hematocrit 34.0 - 46.6 % 39.5 39.5 32.6(L)  Platelets 150 - 400 K/uL 179 - 160   . CBC    Component Value Date/Time   WBC 4.2 03/04/2020 1204   RBC 4.45 03/04/2020 1204   HGB 13.2 03/04/2020 1204   HCT 39.5 03/04/2020 1206   HCT 39.5 03/04/2020 1204   PLT 179 03/04/2020 1204   MCV 88.8 03/04/2020 1204   MCH 29.7 03/04/2020 1204   MCHC 33.4 03/04/2020 1204   RDW 12.4 03/04/2020 1204   LYMPHSABS 1.6 03/04/2020 1204   MONOABS 0.4 03/04/2020 1204    EOSABS 0.1 03/04/2020 1204   BASOSABS 0.0 03/04/2020 1204    . CMP Latest Ref Rng & Units 05/21/2014 05/20/2014 05/14/2014  Glucose 70 - 99 mg/dL 126(H) 138(H) 102(H)  BUN 6 - 23 mg/dL <5(L) 8 16  Creatinine 0.50 - 1.10 mg/dL 0.53 0.61 0.63  Sodium 135 - 145 mmol/L 141 140 143  Potassium 3.5 - 5.1 mmol/L 3.6 3.8 4.3  Chloride 96 - 112 mmol/L 107 107 106  CO2 19 - 32 mmol/L _0 Calcium 8.4 - 10.5 mg/dL 8.5 8.0(L) 9.6  Total Protein 6.0 - 8.3 g/dL - - -  Total Bilirubin 0.3 - 1.2 mg/dL - - -  Alkaline Phos 39 - 117 U/L - - -  AST 0 - 37 U/L - - -  ALT 0 - 35 U/L - - -     RADIOGRAPHIC STUDIES: I have personally reviewed the radiological images as listed and agreed with the findings in the report. DG Cervical Spine 2 or 3 views  Result Date: 02/11/2020 CLINICAL DATA:  Neck pain for several months on the right, initial encounter EXAM: CERVICAL SPINE - 2-3 VIEW COMPARISON:  None. FINDINGS: Seven cervical segments are well visualized. Vertebral body height is well maintained. Facet hypertrophic changes are noted throughout the cervical spine. Mild disc space narrowing is noted at C5-6. No significant anterolisthesis is noted. The odontoid is within normal limits. IMPRESSION: Degenerative change without acute abnormality. Electronically Signed   By: Inez Catalina M.D.   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 No constitutional symptoms No symptoms suggestive of autoimmune disease. No significant steroid exposure. PLAN: -Discussed patient's most recent labs from 02/15/2020, no anemia or thrombocytopenia, mild neutropenia and lymphopenia. -Advised pt that her neutropenia is not very significant and that risk of infections do not increase substantially unless [phone].  -Advised pt that lymphocytes are low-normal and not very concerning either.  -Advised pt that certain infections or autoimmune  conditions could cause the leukopenia that we are seeing. -Advised pt that if there was an acute drop in blood counts or a drop in multiple cell lines we would be more concerned about a primary bone marrow disorder.  -  Advised pt that chronic leukopenia could be normal for her and overall benign.  -Advised pt that chronic acid suppression can cause nutritional deficiencies, particularly Vitamin B, that can cause a drop in blood counts.  -Will get labs today to r/o nutritional deficiencies and Hepatitis infection. -Will see back in 2 weeks via phone   FOLLOW UP: Labs today Phone visit with Dr Irene Limbo in 2 weeks  . Orders Placed This Encounter  Procedures  . CBC with Differential/Platelet    Standing Status:   Future    Number of Occurrences:   1    Standing Expiration Date:   03/04/2021  . CMP ([place] only)    Standing Status:   Future    Number of Occurrences:   1    Standing Expiration Date:   03/04/2021  . Lactate dehydrogenase    Standing Status:   Future    Number of Occurrences:   1    Standing Expiration Date:   03/04/2021  . Vitamin B12    Standing Status:   Future    Number of Occurrences:   1    Standing Expiration Date:   03/04/2021  . Folate RBC    Standing Status:   Future    Number of Occurrences:   1    Standing Expiration Date:   03/04/2021  . Hepatitis C antibody    Standing Status:   Future    Number of Occurrences:   1    Standing Expiration Date:   03/04/2021  . HIV Antibody (routine testing w rflx)    Standing Status:   Future    Number of Occurrences:   1    Standing Expiration Date:   03/04/2021  . Multiple Myeloma Panel (SPEP&IFE w/QIG)    Standing Status:   Future    Number of Occurrences:   1    Standing Expiration Date:   03/04/2021  . Copper, serum    Standing Status:   Future    Number of Occurrences:   1    Standing Expiration Date:   03/04/2021     All of the patients questions were answered with apparent satisfaction. The patient knows to call the  clinic with any problems, questions or concerns.  I spent 30 mins counseling the patient face to face. The total time spent in the appointment was 45 minutes and more than 50% was on counseling and direct patient cares.    Sullivan Lone MD [place] AAHIVMS [facility] Hematology/Oncology Physician [facility]  (Office):       [phone] (Work cell):  [phone] (Fax):           [phone]  03/04/2020 12:21 PM  I, Yevette Edwards, am acting as a scribe for Dr. Sullivan Lone.   .I have reviewed the above documentation for accuracy and completeness, and I agree with the above. Brunetta Genera MD

## 2020-03-05 ENCOUNTER — Telehealth: Payer: Self-pay | Admitting: Hematology

## 2020-03-05 LAB — FOLATE RBC
Folate, Hemolysate: 462 ng/mL
Folate, RBC: 1170 ng/mL (ref >498)
Hematocrit: 39.5 % (ref 34.0–46.6)

## 2020-03-05 NOTE — Telephone Encounter (Signed)
Scheduled per 01/06 los, patient has been called and notified. 

## 2020-03-06 LAB — COPPER, SERUM: Copper: 102 ug/dL (ref 80–158)

## 2020-03-08 LAB — MULTIPLE MYELOMA PANEL, SERUM
Albumin SerPl Elph-Mcnc: 4.1 g/dL (ref 2.9–4.4)
Albumin/Glob SerPl: 1.4 (ref 0.7–1.7)
Alpha 1: 0.2 g/dL (ref 0.0–0.4)
Alpha2 Glob SerPl Elph-Mcnc: 0.6 g/dL (ref 0.4–1.0)
B-Globulin SerPl Elph-Mcnc: 0.9 g/dL (ref 0.7–1.3)
Gamma Glob SerPl Elph-Mcnc: 1.3 g/dL (ref 0.4–1.8)
Globulin, Total: 3 g/dL (ref 2.2–3.9)
IgA: 160 mg/dL (ref 87–352)
IgG (Immunoglobin G), Serum: 1240 mg/dL (ref 586–1602)
IgM (Immunoglobulin M), Srm: 139 mg/dL (ref 26–217)
Total Protein ELP: 7.1 g/dL (ref 6.0–8.5)

## 2020-03-16 ENCOUNTER — Telehealth: Payer: Self-pay | Admitting: Hematology

## 2020-03-16 NOTE — Telephone Encounter (Signed)
Contacted patient to verify telephone visit for pre reg °

## 2020-03-17 ENCOUNTER — Inpatient Hospital Stay (HOSPITAL_BASED_OUTPATIENT_CLINIC_OR_DEPARTMENT_OTHER): Payer: 59 | Admitting: Hematology

## 2020-03-17 DIAGNOSIS — D72819 Decreased white blood cell count, unspecified: Secondary | ICD-10-CM | POA: Diagnosis not present

## 2020-03-17 NOTE — Progress Notes (Signed)
HEMATOLOGY/ONCOLOGY CONSULTATION NOTE  Date of Service: 03/17/2020  Patient Care Team: Shirline Frees, MD as PCP - General (Family Medicine) Bettina Gavia Hilton Cork, MD as PCP - Cardiology (Cardiology)  CHIEF COMPLAINTS/PURPOSE OF CONSULTATION:  Lymphopenia  HISTORY OF PRESENTING ILLNESS:   Kristen Reyes is a wonderful 70 y.o. female who has been referred to [place] by Dr. Kenton Kingfisher for evaluation and management of lymphopenia. The pt reports that she is doing well overall.   The pt reports that in winter of 2020 she was experiencing inflammation in the chest that was thought to be caused by acid reflux. This was treated with off-brand Famotidine, which resolved her symptoms. 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 She has previously had issues with Diverticulitis for which she has had a Colostomy & Reverse Colostomy. Pt is unaware of any liver disease but has had a history of one chronically elevated liver enzyme. She has undergone Hepatitis testing, which was negative. She is unsure if she has ever required blood products but has no tattoos. She denies any frequent, recurrent, or especially significant infections. Pt has some neck discomfort which she is treating with a fair amount of Acetaminophen. Pt has no dietary restrictions or gluten intolerance. Pt has been using Vitamin B and D gummy supplements for over a year. She has not used steroids in the last year for any reason at all. Pt has a long history of difficulty sleeping. She attributes her fatigue to aging. She denies any travel outside of the country.   Most recent lab results (02/15/2020) of CBC w/diff & CMP is as follows: all values are WNL except for WBC at 2.6K, Neutro Abs at 1.3K, Lymphs Abs at 1.00K, Mono Abs at 0.2K, Glucose at 100, AST at 49.  On review of systems, pt reports fatigue, sleeplessness and denies fevers, chills, night sweats, unexpected weight loss, rash, new lumps/bumps, abdominal  pain and any other symptoms.   On PMHx the pt reports PVCs, Diverticulitis, Non-melanoma skin cancer, Colostomy Takedown. On Social Hx the pt reports that she quit smoking over 50 years ago. She currently drinks a glass of wine with dinner but admits 1-2 years of heavier drinking. Pt denies any vaping.  On Family Hx the pt reports that her sister also has a history of elevated liver enzymes. Her grandmother had RA.   Interval History I connected with Estanislado Emms on 03/17/2020 by telephone and verified that I am speaking with the correct person using two identifiers.   I discussed the limitations of evaluation and management by telemedicine. The patient expressed understanding and agreed to proceed.   Other persons participating in the visit and their role in the encounter:                                       -Reinaldo Raddle, Medical Scribe     Patient's location: Home Provider's location: [name] [facility] [place] is a wonderful 70 y.o. female who is here today for for evaluation and management of lymphopenia. The patient's last visit with [place] was on 03/04/2020. The pt reports that she is doing well overall.  The pt reports that she has no new symptoms or concerns.  Lab results from 03/04/2020 of CBC w/diff and CMP is as follows: all values are WNL. 03/04/2020 Folate RBC of Folate,hemolysate at 462, Hematocrit  at 39.5 and Folate, RBC at 1,170. 03/04/2020 LDH at 184. 03/04/2020 Multiple Myeloma Panel at IgG of 1,240, IgA at 160, IgM at 139, Total Protein at 7.1, M protein not observed, Glubulin Total at 3.0, and Albumin/Glob SerPl at 1.4.  03/04/2020 Vitamin B12 at 395.  03/04/2020 Hepatitis C antibody was non-reactive. 03/04/2020 HIV Antibody was non-reactive.  03/04/2020 Copper, serum at 102.   On review of systems, pt denies any symptoms.   MEDICAL HISTORY:  Past Medical History:  Diagnosis Date  . Anxiety   . Atypical chest pain 06/20/2017  . Cancer ([place])     skin cancer  . Cardiac dysrhythmia 06/20/2017  . Colostomy in place [facility])   . Complication of anesthesia   . Difficulty sleeping   . Diverticulitis   . Diverticulitis of sigmoid colon s/p colectomy.  Colostomy takedown 05/19/2014 07/25/2013  . Elevated LFTs 06/20/2017  . Essential hypertension 06/20/2017  . Family history of malignant neoplasm of gastrointestinal tract 06/20/2017  . History of nonmelanoma skin cancer   . Hyperlipidemia 06/20/2017  . Hypertension   . Leiomyoma of uterus 06/20/2017  . Low back pain 06/20/2017  . Mitral valve disorder 06/20/2017  . MVP (mitral valve prolapse)   . Noninflammatory disorder of vulva and perineum 06/20/2017  . Osteoporosis   . Palpitation 06/20/2017  . Panic disorder 06/20/2017  . Perforated sigmoid colon ([place]) 07/25/2013  . PONV (postoperative nausea and vomiting)   . Postmenopausal atrophic vaginitis 06/20/2017  . Postmenopausal bleeding 06/20/2017  . Symptomatic menopausal or female climacteric states 06/20/2017    SURGICAL HISTORY: Past Surgical History:  Procedure Laterality Date  . CARDIAC CATHETERIZATION  2002  . COLON SURGERY    . COLOSTOMY TAKEDOWN N/A 05/19/2014   Procedure: TAKEDOWN HARTMAN COLOSTOMY;  Surgeon: Excell Seltzer, MD;  Location: [facility];  Service: General;  Laterality: N/A;  . LAPAROTOMY N/A 07/09/2013   Procedure: EXPLORATORY LAPAROTOMY, COLOSTOMY, SIGMOID COLECTOMY, HARTMANS PROCEDURE;  Surgeon: Edward Jolly, MD;  Location: [facility];  Service: General;  Laterality: N/A;  . OOPHORECTOMY      SOCIAL HISTORY: Social History   Socioeconomic History  . Marital status: Married    Spouse name: Not on file  . Number of children: Not on file  . Years of education: Not on file  . Highest education level: Not on file  Occupational History  . Not on file  Tobacco Use  . Smoking status: Former Smoker    Quit date: 05/14/1970    Years since quitting: 49.8  . Smokeless tobacco: Never Used  Vaping Use  . Vaping Use:  Never used  Substance and Sexual Activity  . Alcohol use: Yes    Comment: social  . Drug use: No  . Sexual activity: Not on file  Other Topics Concern  . Not on file  Social History Narrative  . Not on file   Social Determinants of Health   Financial Resource Strain: Not on file  Food Insecurity: Not on file  Transportation Needs: Not on file  Physical Activity: Not on file  Stress: Not on file  Social Connections: Not on file  Intimate Partner Violence: Not on file    FAMILY HISTORY: Family History  Problem Relation Age of Onset  . Hyperlipidemia Mother   . Hypertension Mother   . Hyperlipidemia Father   . Hypertension Father     ALLERGIES:  is allergic to erythromycin, erythromycin base, novocain [procaine], and codeine.  MEDICATIONS:  Current Outpatient Medications  Medication Sig Dispense  Refill  . acebutolol (SECTRAL) 200 MG capsule TAKE 1 CAPSULE BY MOUTH DAILY. TAKE 1 EXTRA CAPSULE (200 MG) IN THE EVENING IF NEEDED 180 capsule 1  . acebutolol (SECTRAL) 200 MG capsule 1 capsule (Patient not taking: Reported on 03/04/2020)    . ALPRAZolam (XANAX) 0.5 MG tablet Take 0.125-0.25 mg by mouth at bedtime as needed for anxiety.    Marland Kitchen aspirin EC 81 MG tablet 1 tablet    . esomeprazole (NEXIUM) 20 MG capsule 2 capsules    . imiquimod (ALDARA) 5 % cream Apply topically.    . Multiple Vitamin (MULTIVITAMIN WITH MINERALS) TABS tablet Take 1 tablet by mouth daily.    . simethicone (GAS RELIEF) 80 MG chewable tablet 1 tablet after meals and at bedtime as needed    . zolpidem (AMBIEN) 10 MG tablet Take 10 mg by mouth at bedtime as needed for sleep.     No current facility-administered medications for this visit.    REVIEW OF SYSTEMS:    10 Point review of Systems was done is negative except as noted above.  PHYSICAL EXAMINATION: ECOG PERFORMANCE STATUS: 0 - Asymptomatic   There were no vitals filed for this visit. There were no vitals filed for this visit. .There is no  height or weight on file to calculate BMI.  Telehealth Visit.  LABORATORY DATA:  I have reviewed the data as listed  . CBC Latest Ref Rng & Units 03/04/2020 03/04/2020 05/22/2014  WBC 4.0 - 10.5 K/uL 4.2 - 3.8(L)  Hemoglobin 12.0 - 15.0 g/dL 13.2 - 10.6(L)  Hematocrit 34.0 - 46.6 % 39.5 39.5 32.6(L)  Platelets 150 - 400 K/uL 179 - 160   . CBC    Component Value Date/Time   WBC 4.2 03/04/2020 1204   RBC 4.45 03/04/2020 1204   HGB 13.2 03/04/2020 1204   HCT 39.5 03/04/2020 1206   HCT 39.5 03/04/2020 1204   PLT 179 03/04/2020 1204   MCV 88.8 03/04/2020 1204   MCH 29.7 03/04/2020 1204   MCHC 33.4 03/04/2020 1204   RDW 12.4 03/04/2020 1204   LYMPHSABS 1.6 03/04/2020 1204   MONOABS 0.4 03/04/2020 1204   EOSABS 0.1 03/04/2020 1204   BASOSABS 0.0 03/04/2020 1204    . CMP Latest Ref Rng & Units 03/04/2020 05/21/2014 05/20/2014  Glucose 70 - 99 mg/dL 95 126(H) 138(H)  BUN 8 - 23 mg/dL 17 <5(L) 8  Creatinine 0.44 - 1.00 mg/dL 0.80 0.53 0.61  Sodium 135 - 145 mmol/L 141 141 140  Potassium 3.5 - 5.1 mmol/L 3.9 3.6 3.8  Chloride 98 - 111 mmol/L 107 107 107  CO2 22 - 32 mmol/L '26 27 28  ' Calcium 8.9 - 10.3 mg/dL 9.4 8.5 8.0(L)  Total Protein 6.5 - 8.1 g/dL 7.2 - -  Total Bilirubin 0.3 - 1.2 mg/dL 0.7 - -  Alkaline Phos 38 - 126 U/L 49 - -  AST 15 - 41 U/L 40 - -  ALT 0 - 44 U/L 42 - -     RADIOGRAPHIC STUDIES: I have personally reviewed the radiological images as listed and agreed with the findings in the report. No results found.  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 No constitutional symptoms No symptoms suggestive of autoimmune disease. No significant steroid exposure.  PLAN: -Discussed patient's most recent labs from 03/04/2020; all blood counts and chemistries nml, all other tests came back nml -Advised pt there are no concerns at this  time.   FOLLOW UP: RTC w PCP  . No orders of the defined types  were placed in this encounter.    All of the patients questions were answered with apparent satisfaction. The patient knows to call the clinic with any problems, questions or concerns.   The total time spent in the appointment was 10 minutes and more than 50% was on counseling and direct patient cares.    Sullivan Lone MD [place] AAHIVMS [facility] Hematology/Oncology Physician [facility]  (Office):       [phone] (Work cell):  [phone] (Fax):           [phone]  03/17/2020 6:47 AM   Note completed by Reinaldo Raddle as medical scribe  .I have reviewed the above documentation for accuracy and completeness, and I agree with the above.  Brunetta Genera MD

## 2020-05-28 DIAGNOSIS — K5792 Diverticulitis of intestine, part unspecified, without perforation or abscess without bleeding: Secondary | ICD-10-CM | POA: Insufficient documentation

## 2020-05-28 DIAGNOSIS — D72810 Lymphocytopenia: Secondary | ICD-10-CM

## 2020-05-28 DIAGNOSIS — F5101 Primary insomnia: Secondary | ICD-10-CM

## 2020-05-28 DIAGNOSIS — C801 Malignant (primary) neoplasm, unspecified: Secondary | ICD-10-CM | POA: Insufficient documentation

## 2020-05-28 DIAGNOSIS — E7800 Pure hypercholesterolemia, unspecified: Secondary | ICD-10-CM

## 2020-05-28 DIAGNOSIS — I341 Nonrheumatic mitral (valve) prolapse: Secondary | ICD-10-CM | POA: Insufficient documentation

## 2020-05-28 DIAGNOSIS — K219 Gastro-esophageal reflux disease without esophagitis: Secondary | ICD-10-CM

## 2020-05-28 DIAGNOSIS — G8929 Other chronic pain: Secondary | ICD-10-CM

## 2020-05-28 DIAGNOSIS — Z85828 Personal history of other malignant neoplasm of skin: Secondary | ICD-10-CM | POA: Insufficient documentation

## 2020-05-28 DIAGNOSIS — G479 Sleep disorder, unspecified: Secondary | ICD-10-CM

## 2020-05-28 DIAGNOSIS — R1319 Other dysphagia: Secondary | ICD-10-CM

## 2020-05-28 DIAGNOSIS — Z9889 Other specified postprocedural states: Secondary | ICD-10-CM | POA: Insufficient documentation

## 2020-05-28 DIAGNOSIS — R112 Nausea with vomiting, unspecified: Secondary | ICD-10-CM | POA: Insufficient documentation

## 2020-05-28 DIAGNOSIS — Z933 Colostomy status: Secondary | ICD-10-CM | POA: Insufficient documentation

## 2020-05-28 DIAGNOSIS — T8859XA Other complications of anesthesia, initial encounter: Secondary | ICD-10-CM | POA: Insufficient documentation

## 2020-05-28 HISTORY — DX: Other dysphagia: R13.19

## 2020-05-28 HISTORY — DX: Other chronic pain: G89.29

## 2020-05-28 HISTORY — DX: Gastro-esophageal reflux disease without esophagitis: K21.9

## 2020-05-28 HISTORY — DX: Pure hypercholesterolemia, unspecified: E78.00

## 2020-05-28 HISTORY — DX: Sleep disorder, unspecified: G47.9

## 2020-05-28 HISTORY — DX: Lymphocytopenia: D72.810

## 2020-05-28 HISTORY — DX: Primary insomnia: F51.01

## 2020-06-10 NOTE — Progress Notes (Signed)
Cardiology Office Note:    Date:  06/11/2020   ID:  Kristen Reyes, DOB December 02, 1950, MRN [id number]  PCP:  Shirline Frees, MD  Cardiologist:  Shirlee More, MD    Referring MD: Shirline Frees, MD    ASSESSMENT:    1. PAC (premature atrial contraction)   2. Mitral valve prolapse   3. RBBB   4. Essential hypertension    PLAN:    In order of problems listed above:  1. Overall is done well I have offered her reassurance but continue her beta-blocker and if it told her if the symptoms worsen and she fell we are not capturing we could apply an ambulatory event monitor she is reassured 2. Stable no valvular regurgitation 3. Stable EKG 4. BP at target home blood pressures consistently in the 1 20-1 25 range less than 80.  Continue to trend   Next appointment: 1 year   Medication Adjustments/Labs and Tests Ordered: Current medicines are reviewed at length with the patient today.  Concerns regarding medicines are outlined above.  Orders Placed This Encounter  Procedures  . EKG 12-Lead   Meds ordered this encounter  Medications  . acebutolol (SECTRAL) 200 MG capsule    Sig: TAKE 1 CAPSULE BY MOUTH DAILY. TAKE 1 EXTRA CAPSULE (200 MG) IN THE EVENING IF NEEDED    Dispense:  180 capsule    Refill:  3    No chief complaint on file.   History of Present Illness:    Kristen Reyes is a 70 y.o. female with a hx of mitral valve prolapse right bundle branch block hypertension of palpitation with frequent APCs suppressed with a low-dose beta-blocker last seen 06/10/2019. Compliance with diet, lifestyle and medications: Yes  Takes her beta-blocker every day at times she gets flareups of palpitation take an extra dose in the afternoon Covid is disrupted her routines including regular activity walking No angina edema syncope chest pain. I reviewed the smart phone EKG strips and she has occasional episodes of 2-3 APCs that are symptomatic.  No episodes of atrial  fibrillation Past Medical History:  Diagnosis Date  . Anxiety   . Atypical chest pain 06/20/2017  . Cancer ([place])    skin cancer  . Cardiac dysrhythmia 06/20/2017  . Colostomy in place [facility])   . Complication of anesthesia   . Difficulty sleeping   . Diverticulitis   . Diverticulitis of sigmoid colon s/p colectomy.  Colostomy takedown 05/19/2014 07/25/2013  . Elevated LFTs 06/20/2017  . Essential hypertension 06/20/2017  . Family history of malignant neoplasm of gastrointestinal tract 06/20/2017  . History of nonmelanoma skin cancer   . Hyperlipidemia 06/20/2017  . Hypertension   . Leiomyoma of uterus 06/20/2017  . Low back pain 06/20/2017  . Mitral valve disorder 06/20/2017  . MVP (mitral valve prolapse)   . Noninflammatory disorder of vulva and perineum 06/20/2017  . Osteoporosis   . Palpitation 06/20/2017  . Panic disorder 06/20/2017  . Perforated sigmoid colon ([place]) 07/25/2013  . PONV (postoperative nausea and vomiting)   . Postmenopausal atrophic vaginitis 06/20/2017  . Postmenopausal bleeding 06/20/2017  . Symptomatic menopausal or female climacteric states 06/20/2017    Past Surgical History:  Procedure Laterality Date  . CARDIAC CATHETERIZATION  2002  . COLON SURGERY    . COLOSTOMY TAKEDOWN N/A 05/19/2014   Procedure: TAKEDOWN [place] COLOSTOMY;  Surgeon: Excell Seltzer, MD;  Location: [facility];  Service: General;  Laterality: N/A;  . LAPAROTOMY N/A 07/09/2013   Procedure: EXPLORATORY LAPAROTOMY,  COLOSTOMY, SIGMOID COLECTOMY, HARTMANS PROCEDURE;  Surgeon: Edward Jolly, MD;  Location: [facility];  Service: General;  Laterality: N/A;  . OOPHORECTOMY      Current Medications: Current Meds  Medication Sig  . ALPRAZolam (XANAX) 0.5 MG tablet Take 0.125-0.25 mg by mouth at bedtime as needed for anxiety.  Marland Kitchen esomeprazole (NEXIUM) 20 MG capsule Take 20 mg by mouth daily.  . Multiple Vitamin (MULTIVITAMIN WITH MINERALS) TABS tablet Take 1 tablet by mouth daily.  Marland Kitchen zolpidem (AMBIEN) 10  MG tablet Take 10 mg by mouth at bedtime as needed for sleep.  . [DISCONTINUED] acebutolol (SECTRAL) 200 MG capsule TAKE 1 CAPSULE BY MOUTH DAILY. TAKE 1 EXTRA CAPSULE (200 MG) IN THE EVENING IF NEEDED     Allergies:   Erythromycin, Erythromycin base, Novocain [procaine], and Codeine   Social History   Socioeconomic History  . Marital status: Married    Spouse name: Not on file  . Number of children: Not on file  . Years of education: Not on file  . Highest education level: Not on file  Occupational History  . Not on file  Tobacco Use  . Smoking status: Former Smoker    Quit date: 05/14/1970    Years since quitting: 50.1  . Smokeless tobacco: Never Used  Vaping Use  . Vaping Use: Never used  Substance and Sexual Activity  . Alcohol use: Yes    Comment: social  . Drug use: No  . Sexual activity: Not on file  Other Topics Concern  . Not on file  Social History Narrative  . Not on file   Social Determinants of Health   Financial Resource Strain: Not on file  Food Insecurity: Not on file  Transportation Needs: Not on file  Physical Activity: Not on file  Stress: Not on file  Social Connections: Not on file     Family History: The patient's family history includes Hyperlipidemia in her father and mother; Hypertension in her father and mother. ROS:   Please see the history of present illness.    All other systems reviewed and are negative.  EKGs/Labs/Other Studies Reviewed:    The following studies were reviewed today:  EKG:  EKG ordered today and personally reviewed.  The ekg ordered today demonstrates sinus rhythm poor R wave progression otherwise normal normal variant  Recent Labs: 03/04/2020: ALT 42; BUN 17; Creatinine 0.80; Hemoglobin 13.2; Platelets 179; Potassium 3.9; Sodium 141  Recent Lipid Panel    Component Value Date/Time   TRIG 116 07/21/2013 0355    Physical Exam:    VS:  BP 139/81 (BP Location: Right Arm, Patient Position: Sitting) Comment:  Patients home monitor  Pulse 64   Ht 5\' 3"  (1.6 m)   Wt 100 lb (45.4 kg)   SpO2 98%   BMI 17.71 kg/m     Wt Readings from Last 3 Encounters:  06/11/20 100 lb (45.4 kg)  03/04/20 101 lb 3.2 oz (45.9 kg)  06/10/19 101 lb (45.8 kg)     GEN:  Well nourished, well developed in no acute distress HEENT: Normal NECK: No JVD; No carotid bruits LYMPHATICS: No lymphadenopathy CARDIAC: RRR, no murmurs, rubs, gallops RESPIRATORY:  Clear to auscultation without rales, wheezing or rhonchi  ABDOMEN: Soft, non-tender, non-distended MUSCULOSKELETAL:  No edema; No deformity  SKIN: Warm and dry NEUROLOGIC:  Alert and oriented x 3 PSYCHIATRIC:  Normal affect    Signed, Shirlee More, MD  06/11/2020 12:25 PM    [facility]

## 2020-06-11 ENCOUNTER — Other Ambulatory Visit: Payer: Self-pay

## 2020-06-11 ENCOUNTER — Ambulatory Visit: Payer: 59 | Admitting: Cardiology

## 2020-06-11 ENCOUNTER — Encounter: Payer: Self-pay | Admitting: Cardiology

## 2020-06-11 VITALS — BP 139/81 | HR 64 | Ht 63.0 in | Wt 100.0 lb

## 2020-06-11 DIAGNOSIS — I491 Atrial premature depolarization: Secondary | ICD-10-CM | POA: Diagnosis not present

## 2020-06-11 DIAGNOSIS — I10 Essential (primary) hypertension: Secondary | ICD-10-CM | POA: Diagnosis not present

## 2020-06-11 DIAGNOSIS — I341 Nonrheumatic mitral (valve) prolapse: Secondary | ICD-10-CM

## 2020-06-11 DIAGNOSIS — I4510 Unspecified right bundle-branch block: Secondary | ICD-10-CM | POA: Diagnosis not present

## 2020-06-11 MED ORDER — ACEBUTOLOL HCL 200 MG PO CAPS: ORAL_CAPSULE | ORAL | 3 refills | Status: DC

## 2020-06-11 NOTE — Patient Instructions (Signed)
Medication Instructions:  Your physician recommends that you continue on your current medications as directed. Please refer to the Current Medication list given to you today.  *If you need a refill on your cardiac medications before your next appointment, please call your pharmacy*   Lab Work: None If you have labs (blood work) drawn today and your tests are completely normal, you will receive your results only by: Marland Kitchen MyChart Message (if you have MyChart) OR . A paper copy in the mail If you have any lab test that is abnormal or we need to change your treatment, we will call you to review the results.    Testing/Procedures: NOne   Follow-Up: At [facility], you and your health needs are our priority.  As part of our continuing mission to provide you with exceptional heart care, we have created designated Provider Care Teams.  These Care Teams include your primary Cardiologist (physician) and Advanced Practice Providers (APPs -  Physician Assistants and Nurse Practitioners) who all work together to provide you with the care you need, when you need it.  We recommend signing up for the patient portal called "MyChart".  Sign up information is provided on this After Visit Summary.  MyChart is used to connect with patients for Virtual Visits (Telemedicine).  Patients are able to view lab/test results, encounter notes, upcoming appointments, etc.  Non-urgent messages can be sent to your provider as well.   To learn more about what you can do with MyChart, go to [email or web address].    Your next appointment:   1 year(s)  The format for your next appointment:   In Person  Provider:   Shirlee More, MD   Other Instructions

## 2020-08-15 IMAGING — CR DG CHEST 2V
2 series · 2 of 2 positions shown · non-contrast
Comparison: None.

CLINICAL DATA: Chest pain.  Hypertension.

EXAM:
CHEST - 2 VIEW

[w chest pa]
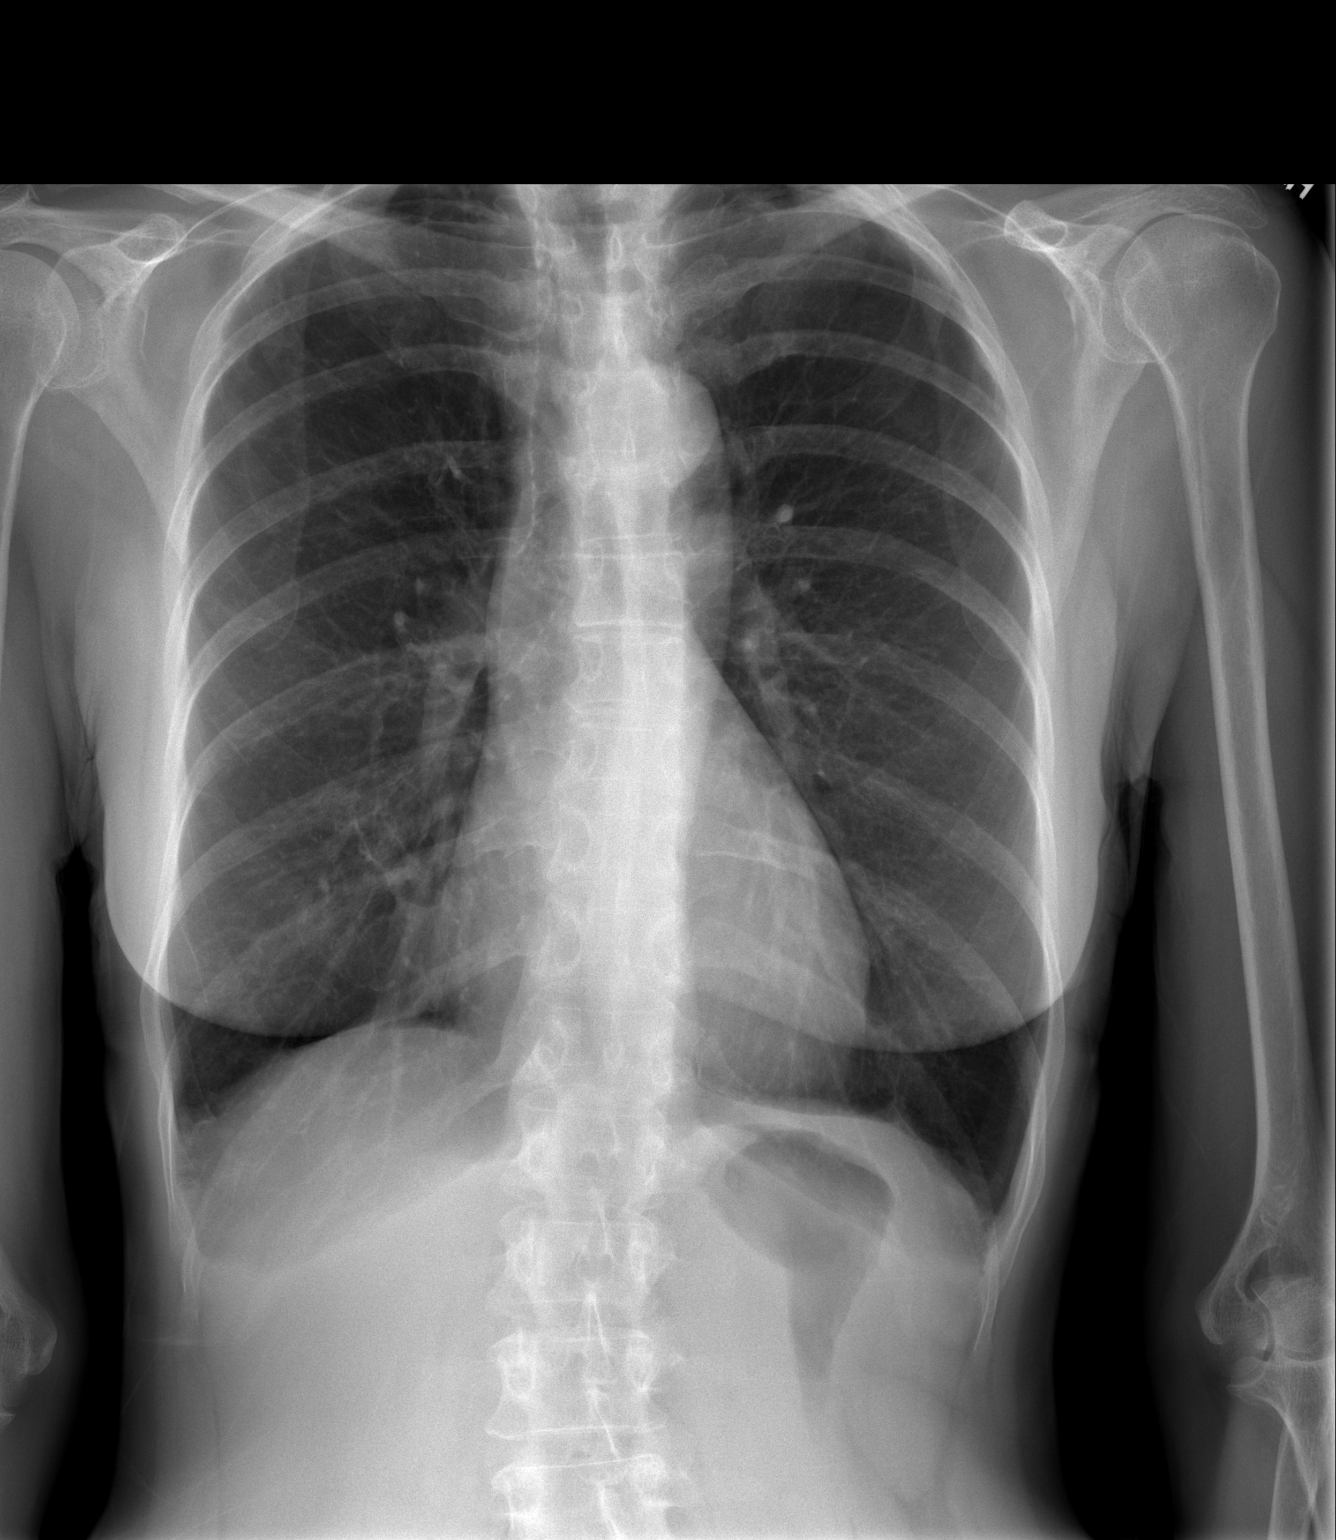

[w chest lat]
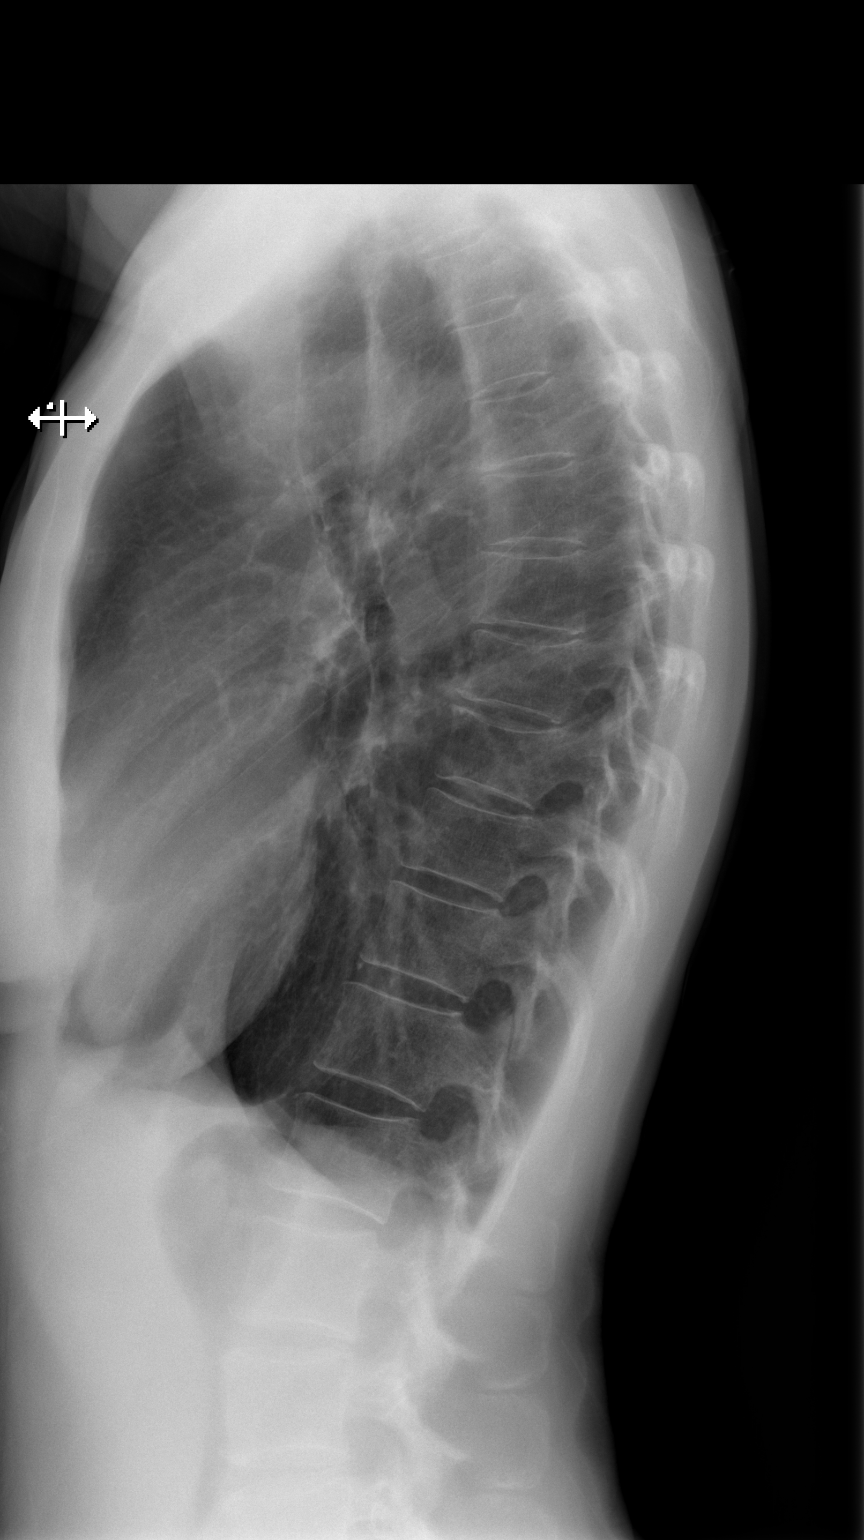

[2 of 2 positions shown; findings below may reference images not displayed]

FINDINGS: Lungs are clear. Heart size and pulmonary vascularity are normal. No
adenopathy. No pneumothorax. No bone lesions.
IMPRESSION: Lungs clear.  Cardiac silhouette normal.

## 2021-02-27 DIAGNOSIS — M1990 Unspecified osteoarthritis, unspecified site: Secondary | ICD-10-CM | POA: Insufficient documentation

## 2021-04-01 ENCOUNTER — Telehealth: Payer: Self-pay | Admitting: Cardiology

## 2021-04-01 NOTE — Telephone Encounter (Signed)
Pt reports that she has been having increased palpations for the past few months and she is concerned. Pt states that she had chest soreness yesterday but felt as if it was from bending over a table. Appointment offered in [place] but pt declined. Pt was given an appointment in [facility] 04/08/21 with Dr. Geraldo Pitter. Pt was advised to go to the ED for chest pain or concerning symptoms. Pt verbalized understanding and had no additional questions.

## 2021-04-01 NOTE — Telephone Encounter (Signed)
Patient c/o Palpitations:  High priority if patient c/o lightheadedness, shortness of breath, or chest pain  How long have you had palpitations/irregular HR/ Afib? Are you having the symptoms now?  Patient states she has been having an irregular HR over the past few months on and off. Became more frequent this week. No symptoms currently.  Are you currently experiencing lightheadedness, SOB or CP?  No, but she is experiencing chest discomfort  Do you have a history of afib (atrial fibrillation) or irregular heart rhythm?  Yes   Have you checked your BP or HR? (document readings if available):  110/74 (last week)  Are you experiencing any other symptoms?  Chest discomfort (denies pain)   Call transferred to [facility], [place].

## 2021-04-02 IMAGING — CR DG CERVICAL SPINE 2 OR 3 VIEWS
3 series · 3 of 3 positions shown · non-contrast
Comparison: None.

CLINICAL DATA: Neck pain for several months on the right, initial
encounter

EXAM:
CERVICAL SPINE - 2-3 VIEW

[w cervical spine lat]
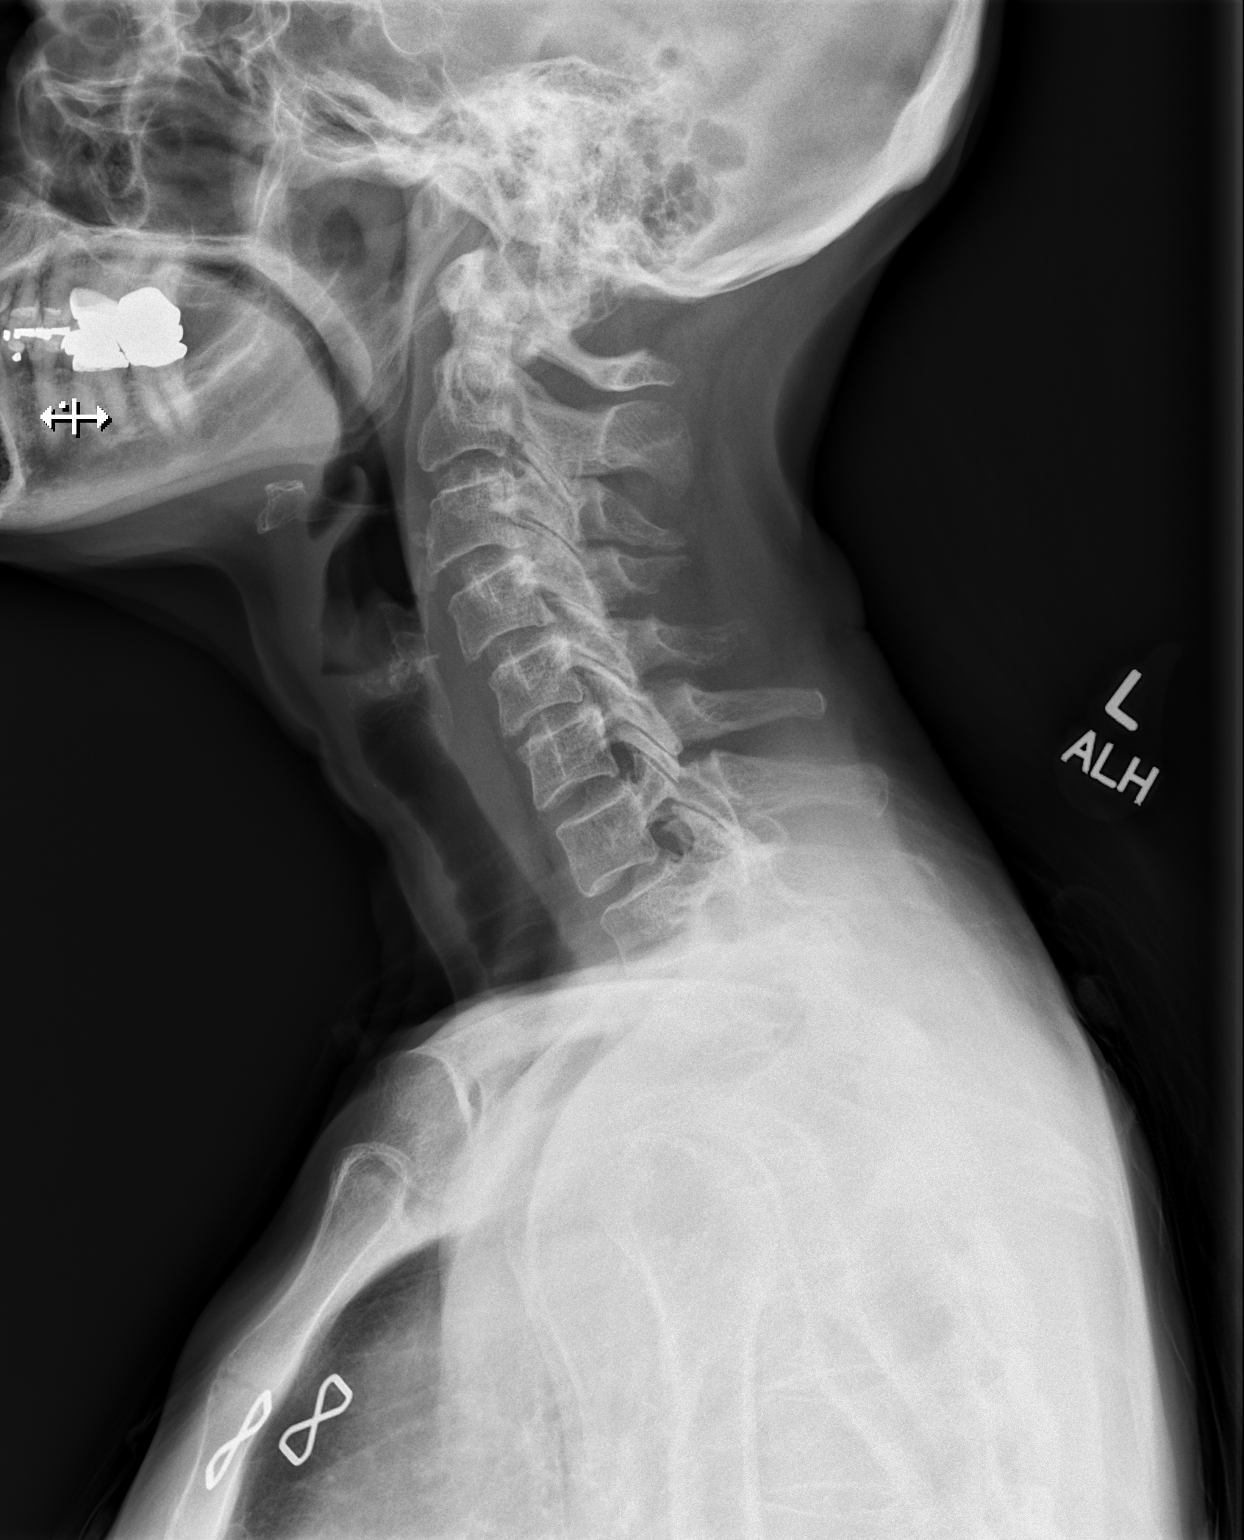

[w cervical spine ap]
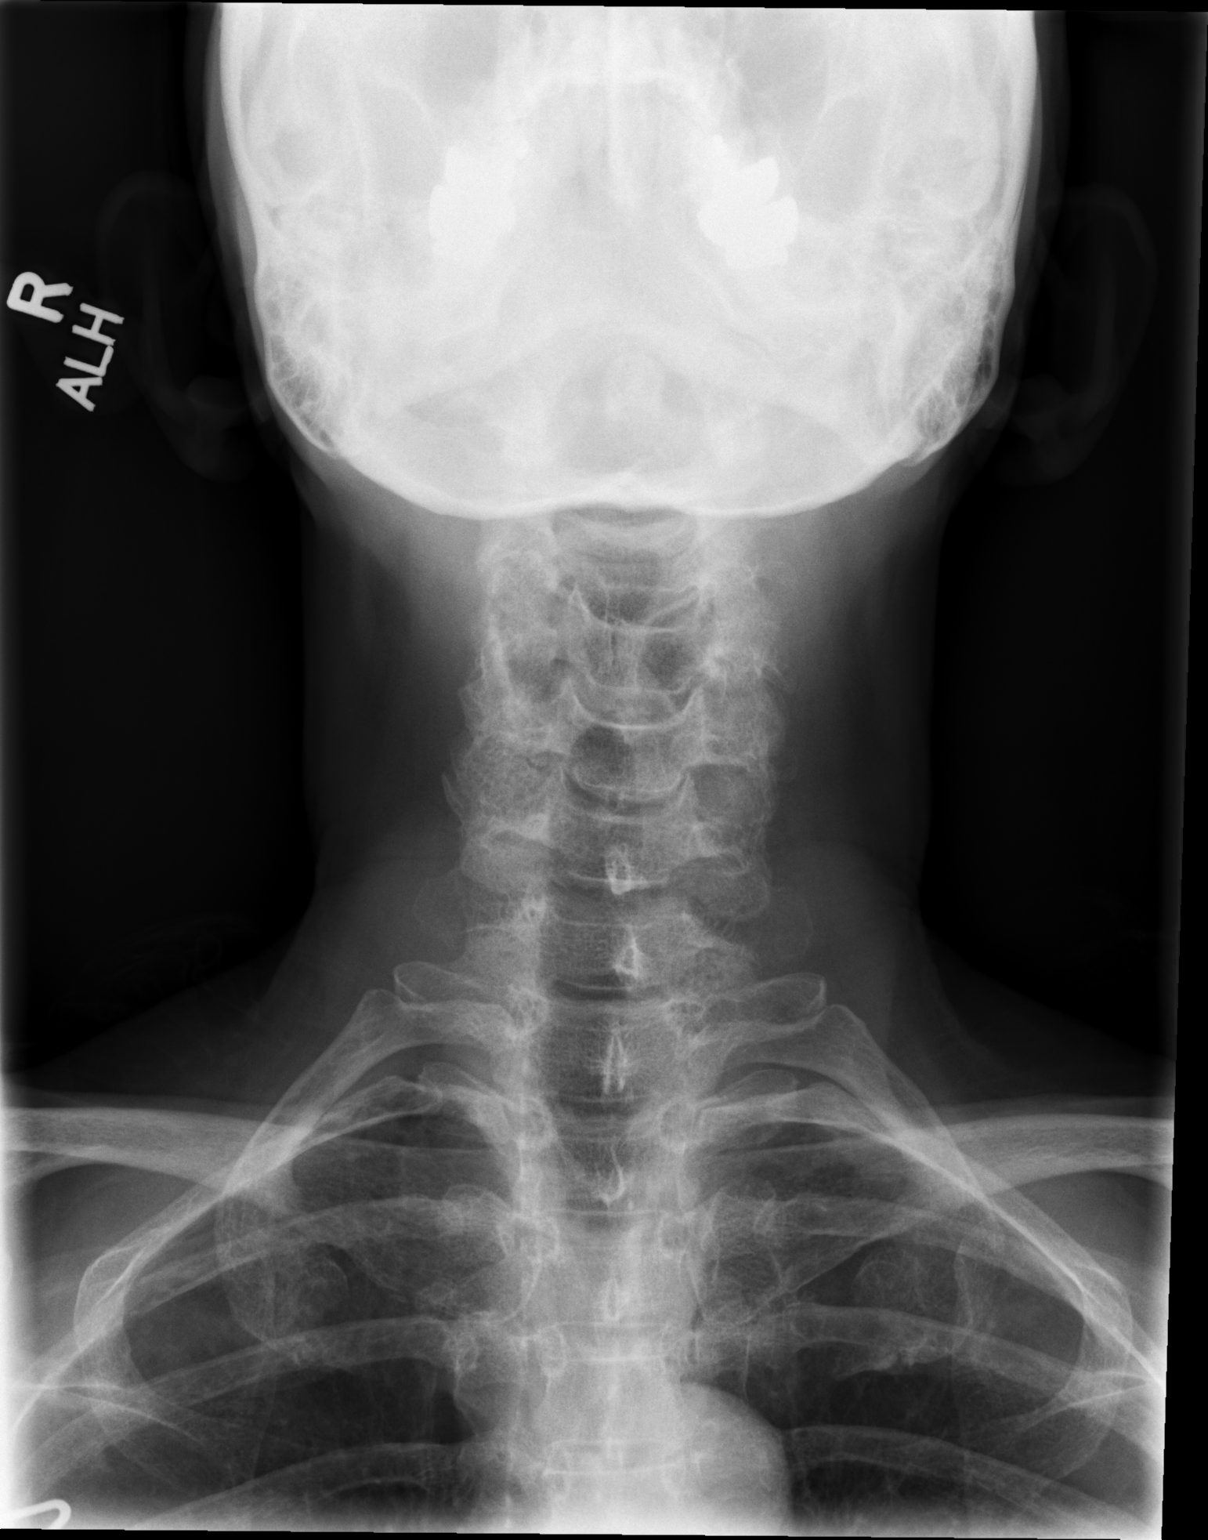

[w cervical spine odontoid]
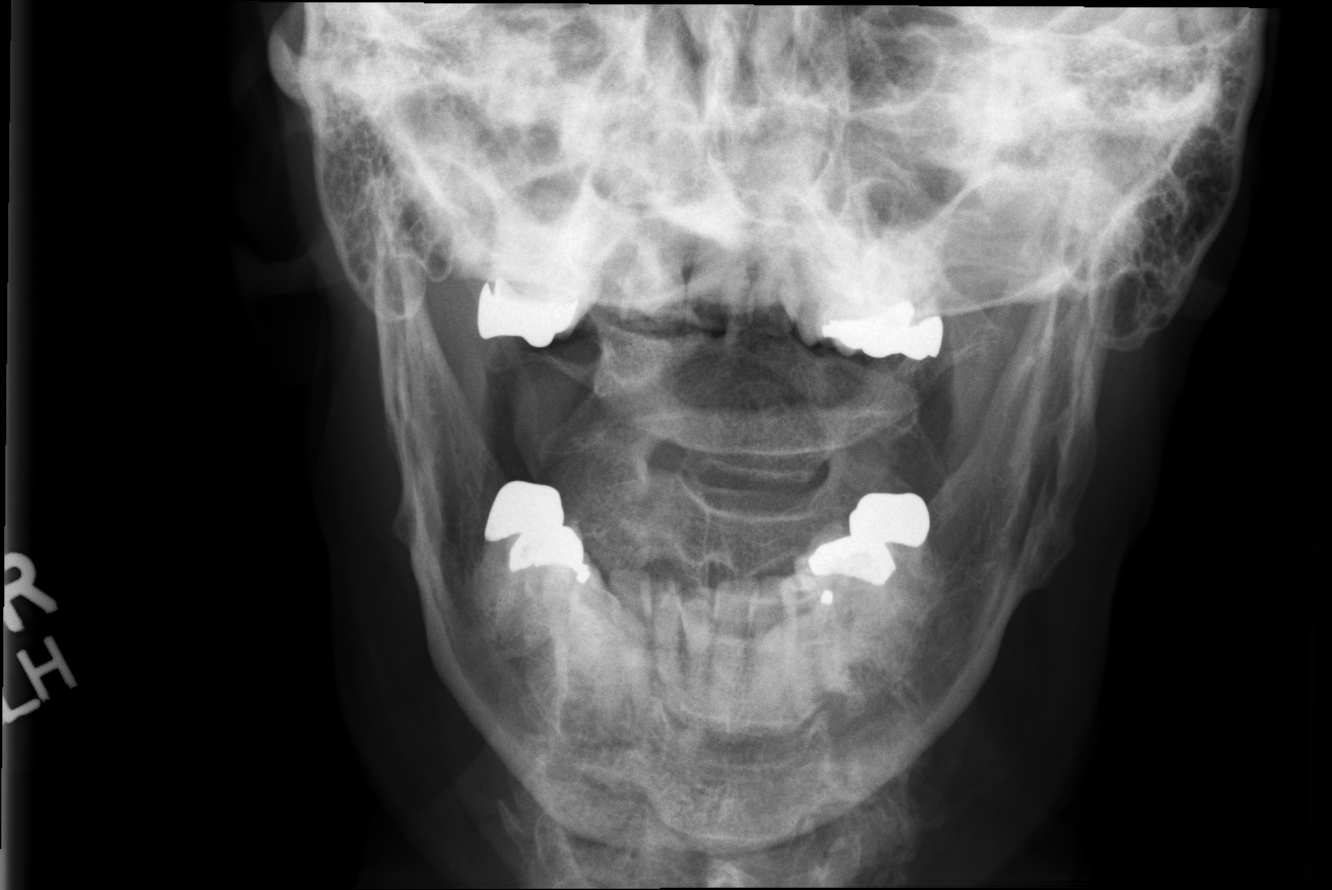

[3 of 3 positions shown; findings below may reference images not displayed]

FINDINGS: Seven cervical segments are well visualized. Vertebral body height
is well maintained. Facet hypertrophic changes are noted throughout
the cervical spine. Mild disc space narrowing is noted at C5-6. No
significant anterolisthesis is noted. The odontoid is within normal
limits.
IMPRESSION: Degenerative change without acute abnormality.

## 2021-04-08 ENCOUNTER — Telehealth: Payer: Self-pay | Admitting: Cardiology

## 2021-04-08 ENCOUNTER — Ambulatory Visit (INDEPENDENT_AMBULATORY_CARE_PROVIDER_SITE_OTHER): Payer: 59

## 2021-04-08 ENCOUNTER — Encounter: Payer: Self-pay | Admitting: Cardiology

## 2021-04-08 ENCOUNTER — Ambulatory Visit: Payer: 59 | Admitting: Cardiology

## 2021-04-08 ENCOUNTER — Other Ambulatory Visit: Payer: Self-pay

## 2021-04-08 VITALS — BP 100/62 | HR 72 | Ht 63.0 in | Wt 99.4 lb

## 2021-04-08 DIAGNOSIS — R002 Palpitations: Secondary | ICD-10-CM

## 2021-04-08 DIAGNOSIS — R079 Chest pain, unspecified: Secondary | ICD-10-CM | POA: Diagnosis not present

## 2021-04-08 DIAGNOSIS — I10 Essential (primary) hypertension: Secondary | ICD-10-CM | POA: Diagnosis not present

## 2021-04-08 DIAGNOSIS — I491 Atrial premature depolarization: Secondary | ICD-10-CM | POA: Diagnosis not present

## 2021-04-08 DIAGNOSIS — R0789 Other chest pain: Secondary | ICD-10-CM

## 2021-04-08 NOTE — Telephone Encounter (Signed)
Patient wants to know if she should keep going on as normal.  Keep going for walks.  The problems she has she didn't think asking him that when she saw him today.

## 2021-04-08 NOTE — Patient Instructions (Addendum)
Medication Instructions:  Your physician recommends that you continue on your current medications as directed. Please refer to the Current Medication list given to you today.  *If you need a refill on your cardiac medications before your next appointment, please call your pharmacy*   Lab Work: None ordered If you have labs (blood work) drawn today and your tests are completely normal, you will receive your results only by: [place] (if you have MyChart) OR A paper copy in the mail If you have any lab test that is abnormal or we need to change your treatment, we will call you to review the results.   Testing/Procedures:     Stress Echocardiogram Information Sheet                                                      Instructions:    1. You may take your morning medications the morning of the test  2. Light breakfast no caffeine  3. Dress prepared to exercise.  4. DO NOT use ANY caffeine or tobacco products 3 hours before appointment.  5. Please bring all current prescription medications.    WHY IS MY DOCTOR PRESCRIBING ZIO? The Zio system is proven and trusted by physicians to detect and diagnose irregular heart rhythms -- and has been prescribed to hundreds of thousands of patients.  The FDA has cleared the Zio system to monitor for many different kinds of irregular heart rhythms. In a study, physicians were able to reach a diagnosis 90% of the time with the Zio system1.  You can wear the Zio monitor -- a small, discreet, comfortable patch -- during your normal day-to-day activity, including while you sleep, shower, and exercise, while it records every single heartbeat for analysis.  1Barrett, P., et al. Comparison of 24 Hour Holter Monitoring Versus 14 Day Novel Adhesive Patch Electrocardiographic Monitoring. [place], 2014.  ZIO VS. HOLTER MONITORING The Zio monitor can be comfortably worn for up to 14 days. Holter monitors can be worn for 24 to 48  hours, limiting the time to record any irregular heart rhythms you may have. Zio is able to capture data for the 51% of patients who have their first symptom-triggered arrhythmia after 48 hours.1  LIVE WITHOUT RESTRICTIONS The Zio ambulatory cardiac monitor is a small, unobtrusive, and water-resistant patch--you might even forget youre wearing it. The Zio monitor records and stores every beat of your heart, whether you're sleeping, working out, or showering. Wear the monitor for 14 days, remove 04/22/21.   Follow-Up: At [facility], you and your health needs are our priority.  As part of our continuing mission to provide you with exceptional heart care, we have created designated Provider Care Teams.  These Care Teams include your primary Cardiologist (physician) and Advanced Practice Providers (APPs -  Physician Assistants and Nurse Practitioners) who all work together to provide you with the care you need, when you need it.  We recommend signing up for the patient portal called "MyChart".  Sign up information is provided on this After Visit Summary.  MyChart is used to connect with patients for Virtual Visits (Telemedicine).  Patients are able to view lab/test results, encounter notes, upcoming appointments, etc.  Non-urgent messages can be sent to your provider as well.   To learn more about what you can do  with MyChart, go to [email or web address].    Your next appointment:   6 month(s)  The format for your next appointment:   In Person  Provider:   Jyl Heinz, MD   Other Instructions Exercise Stress Echocardiogram An exercise stress echocardiogram is a test to check how well your heart is working. This test uses sound waves and a computer to make pictures of your heart. These pictures will be taken before and after you exercise. For this test, you will walk on a treadmill or ride a bicycle to make your heart beat faster. While you exercise, your heart will be checked with an  electrocardiogram (ECG). Your blood pressure will also be checked. You may have this test if: You have chest pain or a heart problem. You had a heart attack or heart surgery not long ago. You have heart valve problems. You have a condition that causes narrowing of the blood vessels that supply your heart. You have a high risk of heart disease and: You are starting a new exercise program. You need to have a big surgery. Tell a doctor about: Any allergies you have. All medicines you are taking. This includes vitamins, herbs, eye drops, creams, and over-the-counter medicines. Any problems you or family members have had with medicines that make you fall asleep (anesthetic medicines). Any surgeries you have had. Any blood disorders you have. Any medical conditions you have. Whether you are pregnant or may be pregnant. What are the risks? Generally, this is a safe test. However, problems may occur, including: Chest pain. Feeling dizzy or light-headed. Shortness of breath. Increased or irregular heartbeat. Feeling like you may vomit (nausea) or vomiting. Heart attack. This is very rare. What happens before the test? Medicines Ask your doctor about changing or stopping your normal medicines. This is important if you take diabetes medicines or blood thinners. If you use an inhaler, bring it to the test. General instructions Wear comfortable clothes and walking shoes. Follow instructions from your doctor about what you cannot eat or drink before the test. Do not drink or eat anything that has caffeine in it. Stop having caffeine 24 hours before the test. Do not smoke or use products that contain nicotine or tobacco for 4 hours before the test. If you need help quitting, ask your doctor. What happens during the test?  You will take off your clothes from the waist up and put on a hospital gown. Electrodes or patches will be put on your chest. A blood pressure cuff will be put on your  arm. Before you exercise, a computer will make a picture of your heart. To do this: You will lie down and a gel will be put on your chest. A wand will be moved over the gel. Sound waves from the wand will go to the computer to make the picture. Then, you will start to exercise. You may walk on a treadmill or pedal a bicycle. Your blood pressure and heart rhythm will be checked while you exercise. The exercise will get harder or faster. You will exercise until: Your heart reaches a certain level. You are too tired to go on. You cannot go on because of chest pain, weakness, or dizziness. You will lie down right away so another picture of your heart can be taken. The procedure may vary among doctors and hospitals. What can I expect after the test? After your test, it is common to have: Mild soreness. Mild tiredness. Your heart rate and blood pressure will be  checked until they return to your normal levels. You should not have any new symptoms after this test. Follow these instructions at home: If your doctor says that you can, you may: Eat what you normally eat. Do your normal activities. Take over-the-counter and prescription medicines only as told by your doctor. Keep all follow-up visits. It is up to you to get the results of your test. Ask how to get your results when they are ready. Contact a doctor if: You feel dizzy or light-headed. You have a fast or irregular heartbeat. You feel like you may vomit or you vomit. You have a headache. You feel short of breath. Get help right away if: You develop pain or pressure: In your chest. In your jaw or neck. Between your shoulders. That goes down your left arm. You faint. You have trouble breathing. These symptoms may be an emergency. Get medical help right away. Call your local emergency services (911 in the [place] Do not wait to see if the symptoms will go away. Do not drive yourself to the hospital. Summary This is a test that  checks how well your heart is working. Follow instructions about what you cannot eat or drink before the test. Ask your doctor if you should take your normal medicines before the test. Stop having caffeine 24 hours before the test. Do not smoke or use products with nicotine or tobacco in them for 4 hours before the test. During the test, your blood pressure and heart rhythm will be checked while you exercise. This information is not intended to replace advice given to you by your health care provider. Make sure you discuss any questions you have with your health care provider. Document Revised: 10/27/2020 Document Reviewed: 10/07/2019 Elsevier Patient Education  2022 [employer].

## 2021-04-08 NOTE — Progress Notes (Signed)
Cardiology Office Note:    Date:  04/08/2021   ID:  Kristen Reyes, DOB 06/03/1950, MRN [id number]  PCP:  Shirline Frees, MD  Cardiologist:  Jenean Lindau, MD   Referring MD: Shirline Frees, MD    ASSESSMENT:    1. Essential hypertension   2. PAC (premature atrial contraction)   3. Palpitations   4. Chest pain in adult   5. Atypical chest pain    PLAN:    In order of problems listed above:  Primary prevention stressed with the patient.  Importance of compliance with that medication stressed she vocalized understanding.  She was advised to walk on a regular basis to the best of her ability.  She tells me that with exertion she has no chest discomfort. Atypical chest pain: Patient is very concerned about it so we will do a stress echo to assess her objectively.  She knows to go to the nearest emergency room for any concerning symptoms. Palpitations: Her recent blood work was unremarkable.  CBC and TSH are fine.  To reassure her I would like to do a 2-week monitor she is agreeable. Patient had multiple questions which were answered to her satisfaction.  Lipids were reviewed and discussed.  Diet was emphasized. Patient will be seen in follow-up appointment in 6 months or earlier if the patient has any concerns.   Medication Adjustments/Labs and Tests Ordered: Current medicines are reviewed at length with the patient today.  Concerns regarding medicines are outlined above.  Orders Placed This Encounter  Procedures   LONG TERM MONITOR (3-14 DAYS)   EKG 12-Lead   ECHOCARDIOGRAM STRESS TEST   No orders of the defined types were placed in this encounter.    No chief complaint on file.    History of Present Illness:    Kristen Reyes is a 71 y.o. female.  Patient has past medical history of essential hypertension, palpitations.  Denies any problems at this time and takes care of activities of daily living.  She mentions to me that she has chest soreness at times.  This  did not occur with exertion.  She leads a sedentary lifestyle.  She does not exercise on a regular basis.  Also she mentions to me that her palpitations concerned that these have been longstanding but have increased in frequency in the past several months.  No dizziness syncope or any such episodes.  Past Medical History:  Diagnosis Date   Anxiety    Atypical chest pain 06/20/2017   Cancer [facility])    skin cancer   Cardiac dysrhythmia 06/20/2017   Chest pain in adult 06/20/2017   Chronic pain 05/28/2020   Colostomy in place [facility])    Complication of anesthesia    Difficulty sleeping    Diverticulitis    Diverticulitis of sigmoid colon s/p colectomy.  Colostomy takedown 05/19/2014 07/25/2013   Elevated LFTs 06/20/2017   Esophageal dysphagia 05/28/2020   Essential hypertension 06/20/2017   Family history of malignant neoplasm of gastrointestinal tract 06/20/2017   Gastroesophageal reflux disease without esophagitis 05/28/2020   History of nonmelanoma skin cancer    Hyperlipidemia 06/20/2017   Hypertension    Leiomyoma of uterus 06/20/2017   Low back pain 06/20/2017   Lymphopenia 05/28/2020   Mitral valve disorder 06/20/2017   Mitral valve prolapse 06/20/2017   MVP (mitral valve prolapse)    Noninflammatory disorder of vulva and perineum 06/20/2017   Osteoporosis    Palpitation 06/20/2017   Panic disorder 06/20/2017   Perforated  sigmoid colon ([place]) 07/25/2013   PONV (postoperative nausea and vomiting)    Postmenopausal atrophic vaginitis 06/20/2017   Postmenopausal bleeding 06/20/2017   Primary insomnia 05/28/2020   Pure hypercholesterolemia 05/28/2020   Sleep disorder 05/28/2020   Symptomatic menopausal or female climacteric states 06/20/2017    Past Surgical History:  Procedure Laterality Date   CARDIAC CATHETERIZATION  2002   COLON SURGERY     COLOSTOMY TAKEDOWN N/A 05/19/2014   Procedure: TAKEDOWN [place] COLOSTOMY;  Surgeon: Excell Seltzer, MD;  Location: [facility];  Service: General;  Laterality: N/A;    LAPAROTOMY N/A 07/09/2013   Procedure: EXPLORATORY LAPAROTOMY, COLOSTOMY, SIGMOID COLECTOMY, HARTMANS PROCEDURE;  Surgeon: Edward Jolly, MD;  Location: [facility];  Service: General;  Laterality: N/A;   OOPHORECTOMY      Current Medications: Current Meds  Medication Sig   acebutolol (SECTRAL) 200 MG capsule TAKE 1 CAPSULE BY MOUTH DAILY. TAKE 1 EXTRA CAPSULE (200 MG) IN THE EVENING IF NEEDED   ALPRAZolam (XANAX) 0.5 MG tablet Take 0.125-0.25 mg by mouth at bedtime as needed for anxiety.   Multiple Vitamin (MULTIVITAMIN WITH MINERALS) TABS tablet Take 1 tablet by mouth daily.   omeprazole (PRILOSEC) 20 MG capsule Take 20 mg by mouth daily.   zolpidem (AMBIEN) 10 MG tablet Take 10 mg by mouth at bedtime as needed for sleep.     Allergies:   Erythromycin, Erythromycin base, Novocain [procaine], and Codeine   Social History   Socioeconomic History   Marital status: Married    Spouse name: Not on file   Number of children: Not on file   Years of education: Not on file   Highest education level: Not on file  Occupational History   Not on file  Tobacco Use   Smoking status: Former    Types: Cigarettes    Quit date: 05/14/1970    Years since quitting: 50.9   Smokeless tobacco: Never  Vaping Use   Vaping Use: Never used  Substance and Sexual Activity   Alcohol use: Yes    Comment: social   Drug use: No   Sexual activity: Not on file  Other Topics Concern   Not on file  Social History Narrative   Not on file   Social Determinants of Health   Financial Resource Strain: Not on file  Food Insecurity: Not on file  Transportation Needs: Not on file  Physical Activity: Not on file  Stress: Not on file  Social Connections: Not on file     Family History: The patient's family history includes Hyperlipidemia in her father and mother; Hypertension in her father and mother.  ROS:   Please see the history of present illness.    All other systems reviewed and are  negative.  EKGs/Labs/Other Studies Reviewed:    The following studies were reviewed today: EKG reveals sinus rhythm with left posterior fascicular block and nonspecific ST-T changes   Recent Labs: No results found for requested labs within last 8760 hours.  Recent Lipid Panel    Component Value Date/Time   TRIG 116 07/21/2013 0355    Physical Exam:    VS:  BP 100/62    Pulse 72    Ht 5\' 3"  (1.6 m)    Wt 99 lb 6.4 oz (45.1 kg)    SpO2 99%    BMI 17.61 kg/m     Wt Readings from Last 3 Encounters:  04/08/21 99 lb 6.4 oz (45.1 kg)  06/11/20 100 lb (45.4 kg)  03/04/20 101  lb 3.2 oz (45.9 kg)     GEN: Patient is in no acute distress HEENT: Normal NECK: No JVD; No carotid bruits LYMPHATICS: No lymphadenopathy CARDIAC: Hear sounds regular, 2/6 systolic murmur at the apex. RESPIRATORY:  Clear to auscultation without rales, wheezing or rhonchi  ABDOMEN: Soft, non-tender, non-distended MUSCULOSKELETAL:  No edema; No deformity  SKIN: Warm and dry NEUROLOGIC:  Alert and oriented x 3 PSYCHIATRIC:  Normal affect   Signed, Jenean Lindau, MD  04/08/2021 1:48 PM    [facility]

## 2021-04-08 NOTE — Telephone Encounter (Signed)
Advised to be as active as she feels up to. Pt verbalized understanding and had no additional questions.

## 2021-04-08 NOTE — Addendum Note (Signed)
Addended by: Truddie Hidden on: 04/08/2021 02:05 PM   Modules accepted: Orders

## 2021-04-20 ENCOUNTER — Telehealth (HOSPITAL_COMMUNITY): Payer: Self-pay | Admitting: Cardiology

## 2021-04-20 NOTE — Telephone Encounter (Signed)
Patient called and cancelled Stress Echocardiogram for 04/26/21. Patient did not wish to reschedule at this time. She states that she will see Dr. Bettina Gavia after wearing monitor and see if she even really needs the test. Order will be removed from the ECHO WQ. Thank you.

## 2021-04-21 ENCOUNTER — Other Ambulatory Visit (HOSPITAL_COMMUNITY): Payer: 59

## 2021-04-26 ENCOUNTER — Other Ambulatory Visit (HOSPITAL_COMMUNITY): Payer: 59

## 2021-06-16 ENCOUNTER — Ambulatory Visit: Payer: 59 | Admitting: Cardiology

## 2021-06-16 ENCOUNTER — Encounter: Payer: Self-pay | Admitting: Cardiology

## 2021-06-16 VITALS — BP 120/80 | HR 66 | Ht 63.0 in | Wt 99.0 lb

## 2021-06-16 DIAGNOSIS — I10 Essential (primary) hypertension: Secondary | ICD-10-CM

## 2021-06-16 DIAGNOSIS — I452 Bifascicular block: Secondary | ICD-10-CM

## 2021-06-16 DIAGNOSIS — I491 Atrial premature depolarization: Secondary | ICD-10-CM

## 2021-06-16 DIAGNOSIS — R002 Palpitations: Secondary | ICD-10-CM

## 2021-06-16 DIAGNOSIS — I341 Nonrheumatic mitral (valve) prolapse: Secondary | ICD-10-CM

## 2021-06-16 NOTE — Progress Notes (Signed)
?Cardiology Office Note:   ? ?Date:  06/16/2021  ? ?ID:  AYME SHORT, DOB 1951-01-07, MRN [id number] ? ?PCP:  Shirline Frees, MD  ?Cardiologist:  Shirlee More, MD   ? ?Referring MD: Shirline Frees, MD  ? ? ?ASSESSMENT:   ? ?1. Palpitations   ?2. PAC (premature atrial contraction)   ?3. Essential hypertension   ?4. Mitral valve prolapse   ?5. 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 ?PLAN:   ? ?In order of problems listed above: ? ?Unfortunately Deshanda has ongoing symptoms for atrial arrhythmia is becoming quite life disrupted we discussed options we will recheck an echocardiogram for reassurance trial of increasing beta-blocker to twice daily as needed she will consider low-dose antiarrhythmic drug like flecainide we also discussed over-the-counter magnesium but previously gave her diarrhea. 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 ? ?Next appointment: 3 months ? ? ?Medication Adjustments/Labs and Tests Ordered: ?Current medicines are reviewed at length with the patient today.  Concerns regarding medicines are outlined above.  ?No orders of the defined types were placed in this encounter. ? ?No orders of the defined types were placed in this encounter. ? ? ?I continue to have episodes of rapid heartbeat and I am unsure what to do ? ? ?History of Present Illness:   ? ?Kristen Reyes is a 71 y.o. female with a hx of symptomatic APCs right bundle branch block mitral valve prolapse and hypertension last seen by my partner Dr. Geraldo Pitter 04/08/2021.  Last seen by me 06/11/2020 and doing well with a low-dose of a selective beta-blocker. ? ?Compliance with diet, lifestyle and medications: Yes  ? ?she is struggling continues to have paroxysms of brief rapid heart rhythm that make her quite apprehensive interfere with her life and prevent her from doing activities  that she wants to do ?She is unsure whether she is at risk to die suddenly heart attack where to turn and says that it exacerbates her panic episodes ? ?I do think she does have atrial arrhythmia I think it is life disruptive and we discussed options. ?Generally does not respond well to EP catheter ablation ?We can add an antiarrhythmic drug like to low-dose flecainide which is very effective ?As an alternative we will going to do is have her take an extra dose of her beta-blocker when she has a flare and do that for 3 days and then drop back to 1 daily ?Recheck an echocardiogram for reassurance ?Be back in the office in a few months to assess her response ? ?An event monitor was performed reported 05/02/2021.  Although the frequency of ventricular and supraventricular ectopy is normal there is symptomatic brief runs of atrial PVCs or atrial tachycardia noted. ?Her EKG 04/08/2021 showed sinus rhythm right atrial abnormality incomplete right bundle branch block left posterior fascicular block. ?Past Medical History:  ?Diagnosis Date  ? Anxiety   ? Atypical chest pain 06/20/2017  ? Cancer [facility])   ? skin cancer  ? Cardiac dysrhythmia 06/20/2017  ? Chest pain in adult 06/20/2017  ? Chronic pain 05/28/2020  ? Colostomy in place [facility])   ? Complication of anesthesia   ? Difficulty sleeping   ? Diverticulitis   ? Diverticulitis of sigmoid colon s/p colectomy.  Colostomy takedown 05/19/2014 07/25/2013  ? Elevated LFTs 06/20/2017  ? Esophageal dysphagia 05/28/2020  ? Essential hypertension 06/20/2017  ?  Family history of malignant neoplasm of gastrointestinal tract 06/20/2017  ? Gastroesophageal reflux disease without esophagitis 05/28/2020  ? History of nonmelanoma skin cancer   ? Hyperlipidemia 06/20/2017  ? Hypertension   ? Leiomyoma of uterus 06/20/2017  ? Low back pain 06/20/2017  ? Lymphopenia 05/28/2020  ? Mitral valve disorder 06/20/2017  ? Mitral valve prolapse 06/20/2017  ? MVP (mitral valve prolapse)   ? Noninflammatory disorder of  vulva and perineum 06/20/2017  ? Osteoporosis   ? Palpitation 06/20/2017  ? Panic disorder 06/20/2017  ? Perforated sigmoid colon ([place]) 07/25/2013  ? PONV (postoperative nausea and vomiting)   ? Postmenopausal atrophic vaginitis 06/20/2017  ? Postmenopausal bleeding 06/20/2017  ? Primary insomnia 05/28/2020  ? Pure hypercholesterolemia 05/28/2020  ? Sleep disorder 05/28/2020  ? Symptomatic menopausal or female climacteric states 06/20/2017  ? ? ?Past Surgical History:  ?Procedure Laterality Date  ? CARDIAC CATHETERIZATION  2002  ? COLON SURGERY    ? COLOSTOMY TAKEDOWN N/A 05/19/2014  ? Procedure: TAKEDOWN [place] COLOSTOMY;  Surgeon: Excell Seltzer, MD;  Location: [facility];  Service: General;  Laterality: N/A;  ? LAPAROTOMY N/A 07/09/2013  ? Procedure: EXPLORATORY LAPAROTOMY, COLOSTOMY, SIGMOID COLECTOMY, HARTMANS PROCEDURE;  Surgeon: Edward Jolly, MD;  Location: [facility];  Service: General;  Laterality: N/A;  ? OOPHORECTOMY    ? ? ?Current Medications: ?Current Meds  ?Medication Sig  ? acebutolol (SECTRAL) 200 MG capsule TAKE 1 CAPSULE BY MOUTH DAILY. TAKE 1 EXTRA CAPSULE (200 MG) IN THE EVENING IF NEEDED  ? ALPRAZolam (XANAX) 0.5 MG tablet Take 0.125-0.25 mg by mouth at bedtime as needed for anxiety.  ? Multiple Vitamin (MULTIVITAMIN WITH MINERALS) TABS tablet Take 1 tablet by mouth daily.  ? Omeprazole 20 MG TBDD Take 20 mg by mouth daily.  ? zolpidem (AMBIEN) 10 MG tablet Take 0.5 mg by mouth at bedtime as needed for sleep.  ?  ? ?Allergies:   Erythromycin, Erythromycin base, Novocain [procaine], and Codeine  ? ?Social History  ? ?Socioeconomic History  ? Marital status: Married  ?  Spouse name: Not on file  ? Number of children: Not on file  ? Years of education: Not on file  ? Highest education level: Not on file  ?Occupational History  ? Not on file  ?Tobacco Use  ? Smoking status: Former  ?  Types: Cigarettes  ?  Quit date: 05/14/1970  ?  Years since quitting: 51.1  ?  Passive exposure: Past  ? Smokeless tobacco:  Never  ?Vaping Use  ? Vaping Use: Never used  ?Substance and Sexual Activity  ? Alcohol use: Yes  ?  Comment: social  ? Drug use: No  ? Sexual activity: Not on file  ?Other Topics Concern  ? Not on file  ?Social History Narrative  ? Not on file  ? ?Social Determinants of Health  ? ?Financial Resource Strain: Not on file  ?Food Insecurity: Not on file  ?Transportation Needs: Not on file  ?Physical Activity: Not on file  ?Stress: Not on file  ?Social Connections: Not on file  ?  ? ?Family History: ?The patient's family history includes Hyperlipidemia in her father and mother; Hypertension in her father and mother. ?ROS:   ?Please see the history of present illness.    ?All other systems reviewed and are negative. ? ?EKGs/Labs/Other Studies Reviewed:   ? ?The following studies were reviewed today: ? ?03/08/2021: ?Cholesterol 191 LDL 104 potassium 4.4 creatinine 0.85 hemoglobin 13.6 ? ?Recent Labs: ?No results found for  requested labs within last 8760 hours.  ?Recent Lipid Panel ?   ?Component Value Date/Time  ? TRIG 116 07/21/2013 0355  ? ? ?Physical Exam:   ? ?VS:  BP 120/80 Comment: Patients home monitor  Pulse 66   Ht '5\' 3"'$  (1.6 m)   Wt 99 lb 0.6 oz (44.9 kg)   SpO2 98%   BMI 17.54 kg/m?    ? ?Wt Readings from Last 3 Encounters:  ?06/16/21 99 lb 0.6 oz (44.9 kg)  ?04/08/21 99 lb 6.4 oz (45.1 kg)  ?06/11/20 100 lb (45.4 kg)  ?  ? ?GEN:  Well nourished, well developed in no acute distress ?HEENT: Normal ?NECK: No JVD; No carotid bruits ?LYMPHATICS: No lymphadenopathy ?CARDIAC: RRR, no murmurs, rubs, gallops ?RESPIRATORY:  Clear to auscultation without rales, wheezing or rhonchi  ?ABDOMEN: Soft, non-tender, non-distended ?MUSCULOSKELETAL:  No edema; No deformity  ?SKIN: Warm and dry ?NEUROLOGIC:  Alert and oriented x 3 ?PSYCHIATRIC:  Normal affect  ? ? ?Signed, ?Shirlee More, MD  ?06/16/2021 2:37 PM    ?[place]  ?

## 2021-06-16 NOTE — Patient Instructions (Signed)
Medication Instructions:  ?Your physician recommends that you continue on your current medications as directed. Please refer to the Current Medication list given to you today. ? ?*If you need a refill on your cardiac medications before your next appointment, please call your pharmacy* ? ? ?Lab Work: ?None ?If you have labs (blood work) drawn today and your tests are completely normal, you will receive your results only by: ?MyChart Message (if you have MyChart) OR ?A paper copy in the mail ?If you have any lab test that is abnormal or we need to change your treatment, we will call you to review the results. ? ? ?Testing/Procedures: ?Your physician has requested that you have an echocardiogram. Echocardiography is a painless test that uses sound waves to create images of your heart. It provides your doctor with information about the size and shape of your heart and how well your heart?s chambers and valves are working. This procedure takes approximately one hour. There are no restrictions for this procedure. ? ? ? ?Follow-Up: ?At [facility], you and your health needs are our priority.  As part of our continuing mission to provide you with exceptional heart care, we have created designated Provider Care Teams.  These Care Teams include your primary Cardiologist (physician) and Advanced Practice Providers (APPs -  Physician Assistants and Nurse Practitioners) who all work together to provide you with the care you need, when you need it. ? ?We recommend signing up for the patient portal called "MyChart".  Sign up information is provided on this After Visit Summary.  MyChart is used to connect with patients for Virtual Visits (Telemedicine).  Patients are able to view lab/test results, encounter notes, upcoming appointments, etc.  Non-urgent messages can be sent to your provider as well.   ?To learn more about what you can do with MyChart, go to [email or web address].   ? ?Your next appointment:   ?3  month(s) ? ?The format for your next appointment:   ?In Person ? ?Provider:   ?Shirlee More, MD  ? ? ?Other Instructions ?Increase Acebtolol to twice daily for a flare up of PAT for 3 days as needed then resume as once daily.  ? ?Important Information About Sugar ? ? ? ? ? ? ?

## 2021-06-27 ENCOUNTER — Other Ambulatory Visit: Payer: Self-pay | Admitting: Cardiology

## 2021-06-27 DIAGNOSIS — I491 Atrial premature depolarization: Secondary | ICD-10-CM

## 2021-07-01 ENCOUNTER — Ambulatory Visit (HOSPITAL_BASED_OUTPATIENT_CLINIC_OR_DEPARTMENT_OTHER)
Admission: RE | Admit: 2021-07-01 | Discharge: 2021-07-01 | Disposition: A | Discharge status: Home / Self Care | Payer: 59 | Source: Ambulatory Visit | Attending: Cardiology | Admitting: Cardiology

## 2021-07-01 ENCOUNTER — Telehealth: Payer: Self-pay | Admitting: Cardiology

## 2021-07-01 DIAGNOSIS — I491 Atrial premature depolarization: Secondary | ICD-10-CM | POA: Insufficient documentation

## 2021-07-01 DIAGNOSIS — R0609 Other forms of dyspnea: Secondary | ICD-10-CM

## 2021-07-01 DIAGNOSIS — R002 Palpitations: Secondary | ICD-10-CM | POA: Insufficient documentation

## 2021-07-01 DIAGNOSIS — I10 Essential (primary) hypertension: Secondary | ICD-10-CM | POA: Diagnosis not present

## 2021-07-01 DIAGNOSIS — I341 Nonrheumatic mitral (valve) prolapse: Secondary | ICD-10-CM

## 2021-07-01 DIAGNOSIS — I452 Bifascicular block: Secondary | ICD-10-CM | POA: Diagnosis not present

## 2021-07-01 LAB — ECHOCARDIOGRAM COMPLETE
AR max vel: 1.95 cm2
AV Area VTI: 1.75 cm2
AV Area mean vel: 1.71 cm2
AV Mean grad: 3 mmHg
AV Peak grad: 6 mmHg
Ao pk vel: 1.22 m/s
Area-P 1/2: 4.08 cm2
S' Lateral: 2.6 cm

## 2021-07-01 NOTE — Telephone Encounter (Signed)
Patient informed of result.

## 2021-07-01 NOTE — Telephone Encounter (Signed)
Pt returning nurses call regarding ECHO results. Please advise 

## 2021-07-01 NOTE — Progress Notes (Signed)
?  Echocardiogram ?2D Echocardiogram has been performed. ? ?Elmer Ramp ?07/01/2021, 11:22 AM ?

## 2021-07-04 ENCOUNTER — Encounter: Payer: Self-pay | Admitting: Cardiology

## 2021-09-19 NOTE — Progress Notes (Unsigned)
Cardiology Office Note:    Date:  09/20/2021   ID:  TZIPPORAH NAGORSKI, DOB 1950/03/25, MRN [id number]  PCP:  Shirline Frees, MD  Cardiologist:  Shirlee More, MD    Referring MD: Shirline Frees, MD    ASSESSMENT:    1. Palpitations   2. PAC (premature atrial contraction)   3. Essential hypertension   4. Mitral valve prolapse    PLAN:    In order of problems listed above:  Her arrhythmia is well controlled by her beta-blocker I think being able to take an extra tablet as needed has helped alleviate the disruptive symptoms and echocardiogram is reassuring Well-controlled continue her beta-blocker   Next appointment: 1 year   Medication Adjustments/Labs and Tests Ordered: Current medicines are reviewed at length with the patient today.  Concerns regarding medicines are outlined above.  Orders Placed This Encounter  Procedures   EKG 12-Lead   No orders of the defined types were placed in this encounter.   Chief Complaint  Patient presents with   Follow-up   Palpitations    History of Present Illness:    MICAELLA GITTO is a 71 y.o. female with a hx of symptomatic APCs right bundle branch block mitral valve prolapse and hypertension last seen 06/16/2021.  Compliance with diet, lifestyle and medications: Yes  She is doing better less palpitation and intermittently takes an extra dose of beta-blocker that is effective I reviewed her echocardiogram that is reassuring We discussed upper extremity strengthening using resistance bands and also advised her to consider a pool program with her joint problems No chest pain syncope shortness of breath or edema  Echo 06/16/2021  1. Left ventricular ejection fraction, by estimation, is 60 to 65%. The  left ventricle has normal function. The left ventricle has no regional  wall motion abnormalities. Left ventricular diastolic parameters were  normal.   2. Right ventricular systolic function is normal. The right ventricular   size is normal.   3. The mitral valve is normal in structure. Mild mitral valve  regurgitation. No evidence of mitral stenosis.   4. The aortic valve is tricuspid. Aortic valve regurgitation is not  visualized. No aortic stenosis is present.   5. The inferior vena cava is normal in size with <50% respiratory  variability, suggesting right atrial pressure of 8 mmHg.  Past Medical History:  Diagnosis Date   Anxiety    Atypical chest pain 06/20/2017   Cancer [facility])    skin cancer   Cardiac dysrhythmia 06/20/2017   Chest pain in adult 06/20/2017   Chronic pain 05/28/2020   Colostomy in place [facility])    Complication of anesthesia    Difficulty sleeping    Diverticulitis    Diverticulitis of sigmoid colon s/p colectomy.  Colostomy takedown 05/19/2014 07/25/2013   Elevated LFTs 06/20/2017   Esophageal dysphagia 05/28/2020   Essential hypertension 06/20/2017   Family history of malignant neoplasm of gastrointestinal tract 06/20/2017   Gastroesophageal reflux disease without esophagitis 05/28/2020   History of nonmelanoma skin cancer    Hyperlipidemia 06/20/2017   Hypertension    Leiomyoma of uterus 06/20/2017   Low back pain 06/20/2017   Lymphopenia 05/28/2020   Mitral valve disorder 06/20/2017   Mitral valve prolapse 06/20/2017   MVP (mitral valve prolapse)    Noninflammatory disorder of vulva and perineum 06/20/2017   Osteoporosis    Palpitation 06/20/2017   Panic disorder 06/20/2017   Perforated sigmoid colon ([place]) 07/25/2013   PONV (postoperative nausea and vomiting)  Postmenopausal atrophic vaginitis 06/20/2017   Postmenopausal bleeding 06/20/2017   Primary insomnia 05/28/2020   Pure hypercholesterolemia 05/28/2020   Sleep disorder 05/28/2020   Symptomatic menopausal or female climacteric states 06/20/2017    Past Surgical History:  Procedure Laterality Date   CARDIAC CATHETERIZATION  2002   COLON SURGERY     COLOSTOMY TAKEDOWN N/A 05/19/2014   Procedure: TAKEDOWN [place] COLOSTOMY;  Surgeon:  Excell Seltzer, MD;  Location: [facility];  Service: General;  Laterality: N/A;   LAPAROTOMY N/A 07/09/2013   Procedure: EXPLORATORY LAPAROTOMY, COLOSTOMY, SIGMOID COLECTOMY, HARTMANS PROCEDURE;  Surgeon: Edward Jolly, MD;  Location: [facility];  Service: General;  Laterality: N/A;   OOPHORECTOMY      Current Medications: Current Meds  Medication Sig   acebutolol (SECTRAL) 200 MG capsule TAKE 1 CAPSULE BY MOUTH DAILY. TAKE 1 EXTRA CAPSULE (200 MG) IN THE EVENING IF NEEDED   ALPRAZolam (XANAX) 0.5 MG tablet Take 0.125-0.25 mg by mouth at bedtime as needed for anxiety.   Magnesium 200 MG TABS Take 200 mg by mouth daily.   Multiple Vitamin (MULTIVITAMIN WITH MINERALS) TABS tablet Take 1 tablet by mouth daily.   Omeprazole 20 MG TBDD Take 20 mg by mouth daily.   vitamin C (ASCORBIC ACID) 500 MG tablet Take 500 mg by mouth daily.   zolpidem (AMBIEN) 10 MG tablet Take 0.5 mg by mouth at bedtime as needed for sleep.     Allergies:   Erythromycin, Erythromycin base, Novocain [procaine], and Codeine   Social History   Socioeconomic History   Marital status: Married    Spouse name: Not on file   Number of children: Not on file   Years of education: Not on file   Highest education level: Not on file  Occupational History   Not on file  Tobacco Use   Smoking status: Former    Types: Cigarettes    Quit date: 05/14/1970    Years since quitting: 51.3    Passive exposure: Past   Smokeless tobacco: Never  Vaping Use   Vaping Use: Never used  Substance and Sexual Activity   Alcohol use: Yes    Comment: social   Drug use: No   Sexual activity: Not on file  Other Topics Concern   Not on file  Social History Narrative   Not on file   Social Determinants of Health   Financial Resource Strain: Not on file  Food Insecurity: Not on file  Transportation Needs: Not on file  Physical Activity: Not on file  Stress: Not on file  Social Connections: Not on file     Family History: The  patient's family history includes Hyperlipidemia in her father and mother; Hypertension in her father and mother. ROS:   Please see the history of present illness.    All other systems reviewed and are negative.  EKGs/Labs/Other Studies Reviewed:    The following studies were reviewed today:  EKG:  EKG ordered today and personally reviewed.  The ekg ordered today demonstrates sinus rhythm RSR prime variant of normal B1 otherwise normal EKG  Recent Labs: 03/08/2021: Cholesterol 191 triglycerides 97 LDL 104 hemoglobin 13.6 creatinine 0.85 potassium 4.4   Physical Exam:    VS:  BP 130/80 (BP Location: Right Arm, Patient Position: Sitting, Cuff Size: Normal)   Pulse 67   Ht '5\' 3"'$  (1.6 m)   Wt 101 lb (45.8 kg)   SpO2 99%   BMI 17.89 kg/m     Wt Readings from Last 3  Encounters:  09/20/21 101 lb (45.8 kg)  06/16/21 99 lb 0.6 oz (44.9 kg)  04/08/21 99 lb 6.4 oz (45.1 kg)     GEN:  Well nourished, well developed in no acute distress HEENT: Normal NECK: No JVD; No carotid bruits LYMPHATICS: No lymphadenopathy CARDIAC: RRR, no murmurs, rubs, gallops RESPIRATORY:  Clear to auscultation without rales, wheezing or rhonchi  ABDOMEN: Soft, non-tender, non-distended MUSCULOSKELETAL:  No edema; No deformity  SKIN: Warm and dry NEUROLOGIC:  Alert and oriented x 3 PSYCHIATRIC:  Normal affect    Signed, Shirlee More, MD  09/20/2021 11:43 AM    [place]

## 2021-09-20 ENCOUNTER — Ambulatory Visit: Payer: 59 | Admitting: Cardiology

## 2021-09-20 ENCOUNTER — Encounter: Payer: Self-pay | Admitting: Cardiology

## 2021-09-20 VITALS — BP 130/80 | HR 67 | Ht 63.0 in | Wt 101.0 lb

## 2021-09-20 DIAGNOSIS — I10 Essential (primary) hypertension: Secondary | ICD-10-CM | POA: Diagnosis not present

## 2021-09-20 DIAGNOSIS — I491 Atrial premature depolarization: Secondary | ICD-10-CM | POA: Diagnosis not present

## 2021-09-20 DIAGNOSIS — R002 Palpitations: Secondary | ICD-10-CM

## 2021-09-20 DIAGNOSIS — I341 Nonrheumatic mitral (valve) prolapse: Secondary | ICD-10-CM | POA: Diagnosis not present

## 2021-09-20 NOTE — Patient Instructions (Addendum)
Medication Instructions:  Your physician recommends that you continue on your current medications as directed. Please refer to the Current Medication list given to you today.  *If you need a refill on your cardiac medications before your next appointment, please call your pharmacy*   Lab Work: None If you have labs (blood work) drawn today and your tests are completely normal, you will receive your results only by: [place] (if you have MyChart) OR A paper copy in the mail If you have any lab test that is abnormal or we need to change your treatment, we will call you to review the results.   Testing/Procedures: None   Follow-Up: At [facility], you and your health needs are our priority.  As part of our continuing mission to provide you with exceptional heart care, we have created designated Provider Care Teams.  These Care Teams include your primary Cardiologist (physician) and Advanced Practice Providers (APPs -  Physician Assistants and Nurse Practitioners) who all work together to provide you with the care you need, when you need it.  We recommend signing up for the patient portal called "MyChart".  Sign up information is provided on this After Visit Summary.  MyChart is used to connect with patients for Virtual Visits (Telemedicine).  Patients are able to view lab/test results, encounter notes, upcoming appointments, etc.  Non-urgent messages can be sent to your provider as well.   To learn more about what you can do with MyChart, go to [email or web address].    Your next appointment:   1 year(s)  The format for your next appointment:   In Person  Provider:   Shirlee More, MD   Other Instructions None  Important Information About Sugar         1. Avoid all over-the-counter antihistamines except Claritin/Loratadine and Zyrtec/Cetrizine. 2. Avoid all combination including cold sinus allergies flu decongestant and sleep medications 3. You can use  Robitussin DM Mucinex and Mucinex DM for cough. 4. can use Tylenol aspirin ibuprofen and naproxen but no combinations such as sleep or sinus.

## 2021-10-06 ENCOUNTER — Ambulatory Visit: Payer: 59 | Admitting: Cardiology

## 2021-10-27 ENCOUNTER — Encounter: Payer: Self-pay | Admitting: Cardiology

## 2021-12-02 ENCOUNTER — Other Ambulatory Visit: Payer: Self-pay | Admitting: Family Medicine

## 2021-12-02 DIAGNOSIS — R7989 Other specified abnormal findings of blood chemistry: Secondary | ICD-10-CM

## 2021-12-02 DIAGNOSIS — R1084 Generalized abdominal pain: Secondary | ICD-10-CM

## 2021-12-12 ENCOUNTER — Ambulatory Visit
Admission: RE | Admit: 2021-12-12 | Discharge: 2021-12-12 | Disposition: A | Discharge status: Home / Self Care | Payer: 59 | Source: Ambulatory Visit | Attending: Family Medicine | Admitting: Family Medicine

## 2021-12-12 DIAGNOSIS — R1084 Generalized abdominal pain: Secondary | ICD-10-CM

## 2021-12-12 DIAGNOSIS — R7989 Other specified abnormal findings of blood chemistry: Secondary | ICD-10-CM

## 2021-12-29 ENCOUNTER — Encounter: Payer: Self-pay | Admitting: Cardiology

## 2022-03-27 ENCOUNTER — Other Ambulatory Visit: Payer: Self-pay | Admitting: Family Medicine

## 2022-03-27 ENCOUNTER — Ambulatory Visit
Admission: RE | Admit: 2022-03-27 | Discharge: 2022-03-27 | Disposition: A | Discharge status: Home / Self Care | Payer: 59 | Source: Ambulatory Visit | Attending: Family Medicine | Admitting: Family Medicine

## 2022-03-27 DIAGNOSIS — M542 Cervicalgia: Secondary | ICD-10-CM

## 2022-03-29 ENCOUNTER — Other Ambulatory Visit: Payer: Self-pay | Admitting: Family Medicine

## 2022-03-29 DIAGNOSIS — M810 Age-related osteoporosis without current pathological fracture: Secondary | ICD-10-CM

## 2022-03-29 DIAGNOSIS — Z1231 Encounter for screening mammogram for malignant neoplasm of breast: Secondary | ICD-10-CM

## 2022-09-01 ENCOUNTER — Ambulatory Visit
Admission: RE | Admit: 2022-09-01 | Discharge: 2022-09-01 | Disposition: A | Discharge status: Home / Self Care | Payer: 59 | Source: Ambulatory Visit | Attending: Family Medicine | Admitting: Family Medicine

## 2022-09-01 DIAGNOSIS — M810 Age-related osteoporosis without current pathological fracture: Secondary | ICD-10-CM

## 2022-09-01 DIAGNOSIS — Z1231 Encounter for screening mammogram for malignant neoplasm of breast: Secondary | ICD-10-CM

## 2022-09-08 ENCOUNTER — Telehealth: Payer: Self-pay | Admitting: Cardiology

## 2022-09-08 NOTE — Telephone Encounter (Signed)
Pt called in she stated she spoke to her new insurance company and she was told to call the doctor office and request a code to give back to her insurance to see if we accept her insurance or not.    Pt states she is switching from [employer] to [employer] [facility] POS plan).

## 2022-09-18 ENCOUNTER — Other Ambulatory Visit: Payer: Self-pay | Admitting: Cardiology

## 2022-09-18 DIAGNOSIS — I491 Atrial premature depolarization: Secondary | ICD-10-CM

## 2022-10-10 DIAGNOSIS — L821 Other seborrheic keratosis: Secondary | ICD-10-CM | POA: Diagnosis not present

## 2022-10-10 DIAGNOSIS — S20369A Insect bite (nonvenomous) of unspecified front wall of thorax, initial encounter: Secondary | ICD-10-CM | POA: Diagnosis not present

## 2022-10-10 DIAGNOSIS — L578 Other skin changes due to chronic exposure to nonionizing radiation: Secondary | ICD-10-CM | POA: Diagnosis not present

## 2022-10-10 DIAGNOSIS — L570 Actinic keratosis: Secondary | ICD-10-CM | POA: Diagnosis not present

## 2022-10-14 ENCOUNTER — Other Ambulatory Visit: Payer: Self-pay | Admitting: Cardiology

## 2022-10-14 DIAGNOSIS — I491 Atrial premature depolarization: Secondary | ICD-10-CM

## 2022-10-20 DIAGNOSIS — H00015 Hordeolum externum left lower eyelid: Secondary | ICD-10-CM | POA: Diagnosis not present

## 2022-11-15 ENCOUNTER — Other Ambulatory Visit: Payer: Self-pay | Admitting: Cardiology

## 2022-11-15 DIAGNOSIS — I491 Atrial premature depolarization: Secondary | ICD-10-CM

## 2022-12-04 DIAGNOSIS — M542 Cervicalgia: Secondary | ICD-10-CM | POA: Diagnosis not present

## 2022-12-19 ENCOUNTER — Other Ambulatory Visit: Payer: Self-pay | Admitting: Cardiology

## 2022-12-19 DIAGNOSIS — I491 Atrial premature depolarization: Secondary | ICD-10-CM

## 2023-01-01 ENCOUNTER — Ambulatory Visit: Payer: Medicare HMO | Attending: Sports Medicine | Admitting: Physical Therapy

## 2023-01-01 ENCOUNTER — Other Ambulatory Visit: Payer: Self-pay

## 2023-01-01 DIAGNOSIS — M6281 Muscle weakness (generalized): Secondary | ICD-10-CM | POA: Insufficient documentation

## 2023-01-01 DIAGNOSIS — R42 Dizziness and giddiness: Secondary | ICD-10-CM | POA: Insufficient documentation

## 2023-01-01 DIAGNOSIS — R293 Abnormal posture: Secondary | ICD-10-CM | POA: Diagnosis not present

## 2023-01-01 DIAGNOSIS — M542 Cervicalgia: Secondary | ICD-10-CM | POA: Insufficient documentation

## 2023-01-01 NOTE — Therapy (Signed)
OUTPATIENT PHYSICAL THERAPY VESTIBULAR EVALUATION     Patient Name: Kristen Reyes MRN: [id number] DOB:Apr 14, 1950, 72 y.o., female Today's Date: 01/01/2023  END OF SESSION:  PT End of Session - 01/01/23 1043     Visit Number 1    Number of Visits 6    Date for PT Re-Evaluation 02/12/23    Authorization Type [employer]    PT Start Time 1055    PT Stop Time 1140    PT Time Calculation (min) 45 min             Past Medical History:  Diagnosis Date   Anxiety    Atypical chest pain 06/20/2017   Cancer (HCC)    skin cancer   Cardiac dysrhythmia 06/20/2017   Chest pain in adult 06/20/2017   Chronic pain 05/28/2020   Colostomy in place [facility])    Complication of anesthesia    Difficulty sleeping    Diverticulitis    Diverticulitis of sigmoid colon s/p colectomy.  Colostomy takedown 05/19/2014 07/25/2013   Elevated LFTs 06/20/2017   Esophageal dysphagia 05/28/2020   Essential hypertension 06/20/2017   Family history of malignant neoplasm of gastrointestinal tract 06/20/2017   Gastroesophageal reflux disease without esophagitis 05/28/2020   History of nonmelanoma skin cancer    Hyperlipidemia 06/20/2017   Hypertension    Leiomyoma of uterus 06/20/2017   Low back pain 06/20/2017   Lymphopenia 05/28/2020   Mitral valve disorder 06/20/2017   Mitral valve prolapse 06/20/2017   MVP (mitral valve prolapse)    Noninflammatory disorder of vulva and perineum 06/20/2017   Osteoporosis    Palpitation 06/20/2017   Panic disorder 06/20/2017   Perforated sigmoid colon (HCC) 07/25/2013   PONV (postoperative nausea and vomiting)    Postmenopausal atrophic vaginitis 06/20/2017   Postmenopausal bleeding 06/20/2017   Primary insomnia 05/28/2020   Pure hypercholesterolemia 05/28/2020   Sleep disorder 05/28/2020   Symptomatic menopausal or female climacteric states 06/20/2017   Past Surgical History:  Procedure Laterality Date   CARDIAC CATHETERIZATION  2002   COLON SURGERY     COLOSTOMY TAKEDOWN N/A 05/19/2014    Procedure: TAKEDOWN [facility] COLOSTOMY;  Surgeon: Glenna Fellows, MD;  Location: [facility];  Service: General;  Laterality: N/A;   LAPAROTOMY N/A 07/09/2013   Procedure: EXPLORATORY LAPAROTOMY, COLOSTOMY, SIGMOID COLECTOMY, HARTMANS PROCEDURE;  Surgeon: Mariella Saa, MD;  Location: [facility];  Service: General;  Laterality: N/A;   OOPHORECTOMY     Patient Active Problem List   Diagnosis Date Noted   Palpitations 04/08/2021   Chronic pain 05/28/2020   Esophageal dysphagia 05/28/2020   Gastroesophageal reflux disease without esophagitis 05/28/2020   Lymphopenia 05/28/2020   Primary insomnia 05/28/2020   Pure hypercholesterolemia 05/28/2020   Sleep disorder 05/28/2020   PONV (postoperative nausea and vomiting)    MVP (mitral valve prolapse)    History of nonmelanoma skin cancer    Diverticulitis    Difficulty sleeping    Complication of anesthesia    Colostomy in place [facility])    Cancer (HCC)    Palpitation 06/20/2017   Panic disorder 06/20/2017   Osteoporosis 06/20/2017   Noninflammatory disorder of vulva and perineum 06/20/2017   Mitral valve prolapse 06/20/2017   Low back pain 06/20/2017   Leiomyoma of uterus 06/20/2017   Family history of malignant neoplasm of gastrointestinal tract 06/20/2017   Cardiac dysrhythmia 06/20/2017   Postmenopausal atrophic vaginitis 06/20/2017   Postmenopausal bleeding 06/20/2017   Symptomatic menopausal or female climacteric states 06/20/2017   Essential hypertension 06/20/2017  Chest pain in adult 06/20/2017   Hyperlipidemia 06/20/2017   Elevated LFTs 06/20/2017   Mitral valve disorder 06/20/2017   Atypical chest pain 06/20/2017   Hypertension    Anxiety    Diverticulitis of sigmoid colon s/p colectomy.  Colostomy takedown 05/19/2014 07/25/2013   Perforated sigmoid colon (HCC) 07/25/2013    PCP: Noberto Retort, MD REFERRING PROVIDER: Hurman Horn, MD  REFERRING DIAG: M54.2 (ICD-10-CM) - Cervicalgia  THERAPY DIAG:   Cervicalgia  Abnormal posture  Muscle weakness (generalized)  Dizziness and giddiness  ONSET DATE: ~1 year (noted increased pain after raking leaves)  Rationale for Evaluation and Treatment: Rehabilitation  SUBJECTIVE:   SUBJECTIVE STATEMENT: Pt reports on/off neck pain. Pt states this past year has been intense. Pt saw doctor and tried different things. Pt was given muscle relaxers and exercises. Pt states she use heat wrap and gets massages from her husband which does help. Pt states neck exercises also seem to help. Pt notes decrease in her severe headaches since starting exercises. Pt states she has had dizziness in the summer. Will feel it with bending forward and coming back up. Notices more dizziness with her neck pain. Feels more lightheaded and imbalance. Pt is left hand dominant.  Pt accompanied by: self  PERTINENT HISTORY: chronic neck pain, worked primarily at a desk  PAIN:  Are you having pain? Yes: NPRS scale: 2 currently, at worst 10/10 Pain location: lower cervical Pain description: muscle sore, can radiate up or down (R worse than L), "my head feels too heavy" Aggravating factors: "being awake", bending/leaning forward, using computer  Relieving factors: Heat, exercises, massage  PRECAUTIONS: None  RED FLAGS: None   WEIGHT BEARING RESTRICTIONS: No  FALLS: Has patient fallen in last 6 months? No  LIVING ENVIRONMENT: Lives with: lives with their spouse Lives in: House/apartment Stairs: No Has following equipment at home: None  PLOF: Independent  PATIENT GOALS: Decrease neck pain for yard work  OBJECTIVE:  Note: Objective measures were completed at Evaluation unless otherwise noted.  DIAGNOSTIC FINDINGS: 03/27/22 cervical x-ray IMPRESSION: 1. Chronic grade 1 anterolisthesis of C4 on C5 and C5 on C6. 2. Mild degenerative disc disease at C4-C5 and C5-C6. 3. Moderate diffuse facet hypertrophy.  COGNITION: Overall cognitive status: Within functional  limits for tasks assessed   SENSATION: "Not usually"  EDEMA:  None  POSTURE:  rounded shoulders, forward head, and increased thoracic kyphosis  Cervical ROM:    Active A/PROM (deg) eval  Flexion 30  Extension 25  Right lateral flexion 18  Left lateral flexion 15  Right rotation 40  Left rotation 35  (Blank rows = not tested)  UPPER EXTREMITY MMT:  MMT Right eval Left eval  Shoulder flexion 3+ 3+  Shoulder extension 4- 4  Shoulder abduction 3+ 3+  Shoulder adduction    Shoulder internal rotation 5 5  Shoulder external rotation 3+ 3+  Middle trapezius 3+ 4  Lower trapezius 3 3+  Elbow flexion    Elbow extension    Wrist flexion    Wrist extension    Wrist ulnar deviation    Wrist radial deviation    Wrist pronation    Wrist supination    Grip strength     (Blank rows = not tested)   BED MOBILITY:  Independent  TRANSFERS: Independent  GAIT: Gait pattern: WFL and step through pattern Distance walked: Into clinic Assistive device utilized: None Level of assistance: Complete Independence  FUNCTIONAL TESTS:  Did not assess  PATIENT SURVEYS:  NDI: to be assessed  VESTIBULAR ASSESSMENT:  GENERAL OBSERVATION: decreased neck ROM   SYMPTOM BEHAVIOR:  Subjective history: Dizziness mostly occurs with neck pain or upon initial sit to stand  Non-Vestibular symptoms: neck pain and headaches  Type of dizziness: Unsteady with head/body turns and Lightheadedness/Faint  Frequency: with neck pain or initial standing  Duration: a few seconds  Aggravating factors: Induced by motion: looking up at the ceiling and bending down to the ground  Relieving factors: head stationary, rest, and slow movements  Progression of symptoms: worse  OCULOMOTOR EXAM: Did not assess  FRENZEL - FIXATION SUPRESSED: Did not assess  VESTIBULAR - OCULAR REFLEX:  Did not assess   POSITIONAL TESTING: did not assess  MOTION SENSITIVITY:  Motion Sensitivity Quotient Intensity:  0 = none, 1 = Lightheaded, 2 = Mild, 3 = Moderate, 4 = Severe, 5 = Vomiting  Intensity  1. Sitting to supine   2. Supine to L side   3. Supine to R side   4. Supine to sitting   5. L Hallpike-Dix   6. Up from L    7. R Hallpike-Dix   8. Up from R    9. Sitting, head tipped to L knee   10. Head up from L knee   11. Sitting, head tipped to R knee   12. Head up from R knee   13. Sitting head turns x5   14.Sitting head nods x5   15. In stance, 180 turn to L    16. In stance, 180 turn to R     OTHOSTATICS: not done   TREATMENT:                                                                                                   DATE: 01/01/23 Reviewed and modified her current set of exercises and added 2 mid back exercises (see below)  PATIENT EDUCATION: Education details: Exam findings, POC, initial HEP, self massage for neck tightness, briefly discussed TENS Person educated: Patient Education method: Explanation, Demonstration, and Handouts Education comprehension: verbalized understanding, returned demonstration, and needs further education  HOME EXERCISE PROGRAM: Access Code: [email or web address] URL: [email or web address] Date: 01/01/2023 Prepared by: Vernon Prey April Kirstie Peri  Exercises - Seated Isometric Cervical Rotation  - 1 x daily - 7 x weekly - 2 sets - 5 reps - 5 sec hold - Seated Levator Scapulae Stretch  - 1 x daily - 7 x weekly - 2 sets - 15 sec hold - Seated Passive Cervical Retraction  - 1 x daily - 7 x weekly - 2 sets - 10 reps - Shoulder External Rotation and Scapular Retraction  - 1 x daily - 7 x weekly - 2 sets - 10 reps - 3 sec hold - Shoulder External Rotation in 45 Degrees Abduction  - 1 x daily - 7 x weekly - 2 sets - 10 reps - 3 sec hold  GOALS: Goals reviewed with patient? Yes  SHORT TERM GOALS: Target date: 01/22/2023   Pt will be ind with initial HEP Baseline: Goal status: INITIAL  2.  Pt will be  ind with self management of trigger points  and neck pain for sleep and times awake to keep pain </=4/10 Baseline:  Goal status: INITIAL  3.  PT will assess pt's NDI for goal setting Baseline:  Goal status: INITIAL   LONG TERM GOALS: Target date: 02/12/2023   Pt will be ind with management and progression of HEP Baseline:  Goal status: INITIAL  2.  Pt will demo at least 4/5 bilat UE/midback strength for improved postural stability with bending/yard tasks Baseline:  Goal status: INITIAL  3.  Pt will demo increased neck ROM to >/=10 deg in all directions for improved mobility Baseline:  Goal status: INITIAL  4.  Pt will have improved NDI score by >/=7.5 point difference from baseline to demo MCID Baseline:  Goal status: INITIAL  5.  Pt will report greatest neck pain to be no more than 5/10 x 2 weeks Baseline:  Goal status: INITIAL   ASSESSMENT:  CLINICAL IMPRESSION: Patient is a 72 y.o. F who was seen today for physical therapy evaluation and treatment for neck pain, headaches, and dizziness. Headaches and dizziness appear to be more cervicogenic in nature based on pt's H&P. Neck pain on eval; however, was controlled at 2/10. Assessment significant for pt with postural abnormalities, cervical paraspinal, suboccipital and levator scap tightness, and weak shoulders/postural stability likely leading to increased neck muscle strain/pain affecting her home tasks. Pt will benefit from PT to address these deficits for improved overall comfort with her IADLs and ADLs.   OBJECTIVE IMPAIRMENTS: decreased activity tolerance, decreased endurance, decreased mobility, difficulty walking, decreased ROM, decreased strength, dizziness, increased fascial restrictions, increased muscle spasms, impaired flexibility, impaired UE functional use, postural dysfunction, and pain.   ACTIVITY LIMITATIONS: carrying, lifting, bending, and reach over head  PARTICIPATION LIMITATIONS: meal prep, cleaning, laundry, driving, shopping, community  activity, and yard work  PERSONAL FACTORS: Age, Fitness, Past/current experiences, and Time since onset of injury/illness/exacerbation are also affecting patient's functional outcome.   REHAB POTENTIAL: Good  CLINICAL DECISION MAKING: Evolving/moderate complexity  EVALUATION COMPLEXITY: Moderate   PLAN:  PT FREQUENCY: 1x/week  PT DURATION: 6 weeks  PLANNED INTERVENTIONS: 97164- PT Re-evaluation, 97110-Therapeutic exercises, 97530- Therapeutic activity, [id number]- Neuromuscular re-education, 97535- Self Care, 82956- Manual therapy, 95992- Canalith repositioning, 97014- Electrical stimulation (unattended), 97033- Ionotophoresis 4mg /ml Dexamethasone, Patient/Family education, Dry Needling, Joint mobilization, Spinal mobilization, Vestibular training, Cryotherapy, and Moist heat  PLAN FOR NEXT SESSION: Assess NDI. Assess response to HEP and modify accordingly. Manual therapy/TPDN as indicated for c-spine. Trial of TENS. Continue postural/shoulder strengthening. Vestibular assessment if indicated.    Zerah Hilyer April Ma L Macarthur Lorusso, PT 01/01/2023, 12:14 PM

## 2023-01-08 ENCOUNTER — Ambulatory Visit: Payer: Medicare HMO | Admitting: Physical Therapy

## 2023-01-08 DIAGNOSIS — R293 Abnormal posture: Secondary | ICD-10-CM

## 2023-01-08 DIAGNOSIS — M6281 Muscle weakness (generalized): Secondary | ICD-10-CM | POA: Diagnosis not present

## 2023-01-08 DIAGNOSIS — M542 Cervicalgia: Secondary | ICD-10-CM | POA: Diagnosis not present

## 2023-01-08 DIAGNOSIS — R42 Dizziness and giddiness: Secondary | ICD-10-CM

## 2023-01-08 NOTE — Therapy (Signed)
OUTPATIENT PHYSICAL THERAPY VESTIBULAR/CERVICAL TREATMENT     Patient Name: TNIYA LEPRE MRN: [id number] DOB:05/21/1950, 72 y.o., female Today's Date: 01/08/2023  END OF SESSION:  PT End of Session - 01/08/23 1150     Visit Number 2    Number of Visits 6    Date for PT Re-Evaluation 02/12/23    Authorization Type [employer]    PT Start Time 1150    PT Stop Time 1232    PT Time Calculation (min) 42 min    Activity Tolerance Patient tolerated treatment well    Behavior During Therapy [facility] for tasks assessed/performed              Past Medical History:  Diagnosis Date   Anxiety    Atypical chest pain 06/20/2017   Cancer (HCC)    skin cancer   Cardiac dysrhythmia 06/20/2017   Chest pain in adult 06/20/2017   Chronic pain 05/28/2020   Colostomy in place [facility])    Complication of anesthesia    Difficulty sleeping    Diverticulitis    Diverticulitis of sigmoid colon s/p colectomy.  Colostomy takedown 05/19/2014 07/25/2013   Elevated LFTs 06/20/2017   Esophageal dysphagia 05/28/2020   Essential hypertension 06/20/2017   Family history of malignant neoplasm of gastrointestinal tract 06/20/2017   Gastroesophageal reflux disease without esophagitis 05/28/2020   History of nonmelanoma skin cancer    Hyperlipidemia 06/20/2017   Hypertension    Leiomyoma of uterus 06/20/2017   Low back pain 06/20/2017   Lymphopenia 05/28/2020   Mitral valve disorder 06/20/2017   Mitral valve prolapse 06/20/2017   MVP (mitral valve prolapse)    Noninflammatory disorder of vulva and perineum 06/20/2017   Osteoporosis    Palpitation 06/20/2017   Panic disorder 06/20/2017   Perforated sigmoid colon (HCC) 07/25/2013   PONV (postoperative nausea and vomiting)    Postmenopausal atrophic vaginitis 06/20/2017   Postmenopausal bleeding 06/20/2017   Primary insomnia 05/28/2020   Pure hypercholesterolemia 05/28/2020   Sleep disorder 05/28/2020   Symptomatic menopausal or female climacteric states 06/20/2017   Past Surgical  History:  Procedure Laterality Date   CARDIAC CATHETERIZATION  2002   COLON SURGERY     COLOSTOMY TAKEDOWN N/A 05/19/2014   Procedure: TAKEDOWN Luz Brazen COLOSTOMY;  Surgeon: Glenna Fellows, MD;  Location: [facility];  Service: General;  Laterality: N/A;   LAPAROTOMY N/A 07/09/2013   Procedure: EXPLORATORY LAPAROTOMY, COLOSTOMY, SIGMOID COLECTOMY, HARTMANS PROCEDURE;  Surgeon: Mariella Saa, MD;  Location: [facility];  Service: General;  Laterality: N/A;   OOPHORECTOMY     Patient Active Problem List   Diagnosis Date Noted   Palpitations 04/08/2021   Chronic pain 05/28/2020   Esophageal dysphagia 05/28/2020   Gastroesophageal reflux disease without esophagitis 05/28/2020   Lymphopenia 05/28/2020   Primary insomnia 05/28/2020   Pure hypercholesterolemia 05/28/2020   Sleep disorder 05/28/2020   PONV (postoperative nausea and vomiting)    MVP (mitral valve prolapse)    History of nonmelanoma skin cancer    Diverticulitis    Difficulty sleeping    Complication of anesthesia    Colostomy in place [facility])    Cancer (HCC)    Palpitation 06/20/2017   Panic disorder 06/20/2017   Osteoporosis 06/20/2017   Noninflammatory disorder of vulva and perineum 06/20/2017   Mitral valve prolapse 06/20/2017   Low back pain 06/20/2017   Leiomyoma of uterus 06/20/2017   Family history of malignant neoplasm of gastrointestinal tract 06/20/2017   Cardiac dysrhythmia 06/20/2017   Postmenopausal atrophic vaginitis  06/20/2017   Postmenopausal bleeding 06/20/2017   Symptomatic menopausal or female climacteric states 06/20/2017   Essential hypertension 06/20/2017   Chest pain in adult 06/20/2017   Hyperlipidemia 06/20/2017   Elevated LFTs 06/20/2017   Mitral valve disorder 06/20/2017   Atypical chest pain 06/20/2017   Hypertension    Anxiety    Diverticulitis of sigmoid colon s/p colectomy.  Colostomy takedown 05/19/2014 07/25/2013   Perforated sigmoid colon (HCC) 07/25/2013    PCP: Noberto Retort, MD REFERRING PROVIDER: Hurman Horn, MD  REFERRING DIAG: M54.2 (ICD-10-CM) - Cervicalgia  THERAPY DIAG:  Cervicalgia  Abnormal posture  Muscle weakness (generalized)  Dizziness and giddiness  ONSET DATE: ~1 year (noted increased pain after raking leaves)  Rationale for Evaluation and Treatment: Rehabilitation  SUBJECTIVE:   SUBJECTIVE STATEMENT: Pt reports that things are "bad" today, she woke up with a headache, also she has not slept well for last few nights so unsure if related to her neck problem. Pt reports that she is very sore across the bottom of her neck (indicates splenius muscles) and down into her upper shoulders (levator and UT). Pt's husband had to drive her today due to her symptoms and she reports that neck pain was exacerbated by the car ride. Pt rates her headache and her neck pain as noted below  Pt reports that she has been dealing with all of these symptoms for over a year but the soreness into her upper shoulders only started a few months ago, she feels sore all the time.  Pt is also having vertigo today, is associated with change in position, reports of dizziness.  Pt also states that the dizziness seems to be worse when her neck pain is worse. Pt believes her symptoms may also be related to her vision (she got new glasses in June/July). Pt reports that the dizziness and the stiffness in her neck got worse after getting new glasses and feels like she has to hold her head a certain way in order to see.  Pt states, "I feel like I have a hangover".  Pt reports that she did try new pillows last year (ergonomic neck pillow) but it increased her pain so she returned it. Pt has a tubular pillow from her chiro that is helpful but can only stay on it through about the middle of the night. Pt also has tried a rolled up towel between her head and her pillows based on April's recommendations and that has been helpful except for last night.  Pt accompanied by:  self  PERTINENT HISTORY: chronic neck pain, worked primarily at a desk  PAIN:  Are you having pain? Yes: NPRS scale: 4/10 headache and 8/10 Pain location: lower cervical Pain description: muscle sore, can radiate up or down (R worse than L), "my head feels too heavy" Aggravating factors: "being awake", bending/leaning forward, using computer  Relieving factors: Heat, exercises, massage  PRECAUTIONS: None  RED FLAGS: None   WEIGHT BEARING RESTRICTIONS: No  FALLS: Has patient fallen in last 6 months? No  LIVING ENVIRONMENT: Lives with: lives with their spouse Lives in: House/apartment Stairs: No Has following equipment at home: None  PLOF: Independent  PATIENT GOALS: Decrease neck pain for yard work  OBJECTIVE:  Note: Objective measures were completed at Evaluation unless otherwise noted.  DIAGNOSTIC FINDINGS: 03/27/22 cervical x-ray IMPRESSION: 1. Chronic grade 1 anterolisthesis of C4 on C5 and C5 on C6. 2. Mild degenerative disc disease at C4-C5 and C5-C6. 3. Moderate diffuse facet hypertrophy.  COGNITION: Overall cognitive status: Within functional limits for tasks assessed   SENSATION: "Not usually"  EDEMA:  None  POSTURE:  rounded shoulders, forward head, and increased thoracic kyphosis  Cervical ROM:    Active A/PROM (deg) eval  Flexion 30  Extension 25  Right lateral flexion 18  Left lateral flexion 15  Right rotation 40  Left rotation 35  (Blank rows = not tested)  UPPER EXTREMITY MMT:  MMT Right eval Left eval  Shoulder flexion 3+ 3+  Shoulder extension 4- 4  Shoulder abduction 3+ 3+  Shoulder adduction    Shoulder internal rotation 5 5  Shoulder external rotation 3+ 3+  Middle trapezius 3+ 4  Lower trapezius 3 3+  Elbow flexion    Elbow extension    Wrist flexion    Wrist extension    Wrist ulnar deviation    Wrist radial deviation    Wrist pronation    Wrist supination    Grip strength     (Blank rows = not  tested)    TREATMENT:                                                                                                   TherEx Seated gentle levator scap stretch 3 x 30 sec each B Added to HEP as pt has increase in pain with previously prescribed stretch Supine suboccipital release with tennis balls x 5-10 min Added to HEP after performing manual therapy with some relief of symptoms  Manual Therapy Suboccipital release 5 x 30 sec each with some initial increase in soreness but overall improvement in symptoms  TherAct Trigger Point Dry-Needling  Treatment instructions: Expect mild to moderate muscle soreness. S/S of pneumothorax if dry needled over a lung field, and to seek immediate medical attention should they occur. Patient verbalized understanding of these instructions and education.  Patient Consent Given: Yes Education handout provided: Yes Muscles treated: L and R suboccipitals Treatment response/outcome: deep ache/pressure; increase in soreness in area initially that resolves, R more tender than L side    Trigger Point Dry Needling  What is Trigger Point Dry Needling (DN)? DN is a physical therapy technique used to treat muscle pain and dysfunction. Specifically, DN helps deactivate muscle trigger points (muscle knots).  A thin filiform needle is used to penetrate the skin and stimulate the underlying trigger point. The goal is for a local twitch response (LTR) to occur and for the trigger point to relax. No medication of any kind is injected during the procedure.   What Does Trigger Point Dry Needling Feel Like?  The procedure feels different for each individual patient. Some patients report that they do not actually feel the needle enter the skin and overall the process is not painful. Very mild bleeding may occur. However, many patients feel a deep cramping in the muscle in which the needle was inserted. This is the local twitch response.   How Will I feel after the  treatment? Soreness is normal, and the onset of soreness may not occur for a few hours. Typically this soreness does not last  longer than two days.  Bruising is uncommon, however; ice can be used to decrease any possible bruising.  In rare cases feeling tired or nauseous after the treatment is normal. In addition, your symptoms may get worse before they get better, this period will typically not last longer than 24 hours.   What Can I do After My Treatment? Increase your hydration by drinking more water for the next 24 hours. You may place ice or heat on the areas treated that have become sore, however, do not use heat on inflamed or bruised areas. Heat often brings more relief post needling. You can continue your regular activities, but vigorous activity is not recommended initially after the treatment for 24 hours. DN is best combined with other physical therapy such as strengthening, stretching, and other therapies.    PATIENT EDUCATION: Education details: modified/added to HEP, TPDN (see above), PT POC with plan to alternate visits between DN and addressing vestibular symptoms Person educated: Patient Education method: Explanation, Demonstration, and Handouts Education comprehension: verbalized understanding, returned demonstration, and needs further education  HOME EXERCISE PROGRAM: Access Code: [email or web address] URL: [email or web address] Date: 01/01/2023 Prepared by: Vernon Prey April Kirstie Peri  Exercises - Seated Isometric Cervical Rotation  - 1 x daily - 7 x weekly - 2 sets - 5 reps - 5 sec hold - Seated Levator Scapulae Stretch  - 1 x daily - 7 x weekly - 2 sets - 15 sec hold - Seated Passive Cervical Retraction  - 1 x daily - 7 x weekly - 2 sets - 10 reps - Shoulder External Rotation and Scapular Retraction  - 1 x daily - 7 x weekly - 2 sets - 10 reps - 3 sec hold - Shoulder External Rotation in 45 Degrees Abduction  - 1 x daily - 7 x weekly - 2 sets - 10 reps - 3 sec  hold - Gentle Levator Scapulae Stretch  - 1 x daily - 7 x weekly - 1 sets - 3-5 reps - 30 sec hold - Supine Suboccipital Release with Tennis Balls  - 1 x daily - 7 x weekly - 1 sets - 1 reps - 5-10 min hold    GOALS: Goals reviewed with patient? Yes  SHORT TERM GOALS: Target date: 01/22/2023   Pt will be ind with initial HEP Baseline: Goal status: INITIAL  2.  Pt will be ind with self management of trigger points and neck pain for sleep and times awake to keep pain </=4/10 Baseline:  Goal status: INITIAL  3.  PT will assess pt's NDI for goal setting Baseline:  Goal status: INITIAL   LONG TERM GOALS: Target date: 02/12/2023   Pt will be ind with management and progression of HEP Baseline:  Goal status: INITIAL  2.  Pt will demo at least 4/5 bilat UE/midback strength for improved postural stability with bending/yard tasks Baseline:  Goal status: INITIAL  3.  Pt will demo increased neck ROM to >/=10 deg in all directions for improved mobility Baseline:  Goal status: INITIAL  4.  Pt will have improved NDI score by >/=7.5 point difference from baseline to demo MCID Baseline:  Goal status: INITIAL  5.  Pt will report greatest neck pain to be no more than 5/10 x 2 weeks Baseline:  Goal status: INITIAL   ASSESSMENT:  CLINICAL IMPRESSION: Emphasis of skilled PT session on initiating TPDN, modifying/adding to HEP, and discussing PT POC. Pt initially hesitant to try DN but with good tolerance to treatment this date, can  better assess response next session as she could benefit from needling of her splenius, levator scap, and UT muscles as well. Pt also experiencing vestibular symptoms this date and can benefit from modification of her schedule so that she can see vestibular-trained therapist to address these symptoms as well. Pt continues to benefit from skilled therapy services to work towards increased independence with management of her pain symptoms and improved function.  Continue POC.   OBJECTIVE IMPAIRMENTS: decreased activity tolerance, decreased endurance, decreased mobility, difficulty walking, decreased ROM, decreased strength, dizziness, increased fascial restrictions, increased muscle spasms, impaired flexibility, impaired UE functional use, postural dysfunction, and pain.   ACTIVITY LIMITATIONS: carrying, lifting, bending, and reach over head  PARTICIPATION LIMITATIONS: meal prep, cleaning, laundry, driving, shopping, community activity, and yard work  PERSONAL FACTORS: Age, Fitness, Past/current experiences, and Time since onset of injury/illness/exacerbation are also affecting patient's functional outcome.   REHAB POTENTIAL: Good  CLINICAL DECISION MAKING: Evolving/moderate complexity  EVALUATION COMPLEXITY: Moderate   PLAN:  PT FREQUENCY: 1x/week  PT DURATION: 6 weeks  PLANNED INTERVENTIONS: 97164- PT Re-evaluation, 97110-Therapeutic exercises, 97530- Therapeutic activity, [id number]- Neuromuscular re-education, 97535- Self Care, 16109- Manual therapy, 95992- Canalith repositioning, 97014- Electrical stimulation (unattended), 97033- Ionotophoresis 4mg /ml Dexamethasone, Patient/Family education, Dry Needling, Joint mobilization, Spinal mobilization, Vestibular training, Cryotherapy, and Moist heat  PLAN FOR NEXT SESSION: Assess NDI. Assess response to HEP and modify accordingly. Manual therapy/TPDN as indicated for c-spine. Trial of TENS. Continue postural/shoulder strengthening. Vestibular assessment if indicated. Try Chirp wheel XR.   Peter Congo, PT Peter Congo, PT, DPT, CSRS  01/08/2023, 12:33 PM

## 2023-01-13 ENCOUNTER — Other Ambulatory Visit: Payer: Self-pay | Admitting: Cardiology

## 2023-01-13 DIAGNOSIS — I491 Atrial premature depolarization: Secondary | ICD-10-CM

## 2023-01-15 ENCOUNTER — Ambulatory Visit: Payer: Medicare HMO | Admitting: Physical Therapy

## 2023-01-16 ENCOUNTER — Encounter: Payer: Self-pay | Admitting: Physical Therapy

## 2023-01-16 ENCOUNTER — Ambulatory Visit: Payer: Medicare HMO | Admitting: Physical Therapy

## 2023-01-16 VITALS — BP 161/73 | HR 68

## 2023-01-16 DIAGNOSIS — M6281 Muscle weakness (generalized): Secondary | ICD-10-CM | POA: Diagnosis not present

## 2023-01-16 DIAGNOSIS — R42 Dizziness and giddiness: Secondary | ICD-10-CM

## 2023-01-16 DIAGNOSIS — M542 Cervicalgia: Secondary | ICD-10-CM

## 2023-01-16 DIAGNOSIS — R293 Abnormal posture: Secondary | ICD-10-CM | POA: Diagnosis not present

## 2023-01-16 NOTE — Therapy (Signed)
OUTPATIENT PHYSICAL THERAPY VESTIBULAR/CERVICAL TREATMENT     Patient Name: Kristen Reyes MRN: [id number] DOB:1950/05/20, 72 y.o., female Today's Date: 01/16/2023  END OF SESSION:  PT End of Session - 01/16/23 1404     Visit Number 3    Number of Visits 6    Date for PT Re-Evaluation 02/12/23    Authorization Type [employer]    PT Start Time 1403    PT Stop Time 1443    PT Time Calculation (min) 40 min    Activity Tolerance Patient tolerated treatment well    Behavior During Therapy [facility] for tasks assessed/performed              Past Medical History:  Diagnosis Date   Anxiety    Atypical chest pain 06/20/2017   Cancer (HCC)    skin cancer   Cardiac dysrhythmia 06/20/2017   Chest pain in adult 06/20/2017   Chronic pain 05/28/2020   Colostomy in place [facility])    Complication of anesthesia    Difficulty sleeping    Diverticulitis    Diverticulitis of sigmoid colon s/p colectomy.  Colostomy takedown 05/19/2014 07/25/2013   Elevated LFTs 06/20/2017   Esophageal dysphagia 05/28/2020   Essential hypertension 06/20/2017   Family history of malignant neoplasm of gastrointestinal tract 06/20/2017   Gastroesophageal reflux disease without esophagitis 05/28/2020   History of nonmelanoma skin cancer    Hyperlipidemia 06/20/2017   Hypertension    Leiomyoma of uterus 06/20/2017   Low back pain 06/20/2017   Lymphopenia 05/28/2020   Mitral valve disorder 06/20/2017   Mitral valve prolapse 06/20/2017   MVP (mitral valve prolapse)    Noninflammatory disorder of vulva and perineum 06/20/2017   Osteoporosis    Palpitation 06/20/2017   Panic disorder 06/20/2017   Perforated sigmoid colon (HCC) 07/25/2013   PONV (postoperative nausea and vomiting)    Postmenopausal atrophic vaginitis 06/20/2017   Postmenopausal bleeding 06/20/2017   Primary insomnia 05/28/2020   Pure hypercholesterolemia 05/28/2020   Sleep disorder 05/28/2020   Symptomatic menopausal or female climacteric states 06/20/2017   Past Surgical  History:  Procedure Laterality Date   CARDIAC CATHETERIZATION  2002   COLON SURGERY     COLOSTOMY TAKEDOWN N/A 05/19/2014   Procedure: TAKEDOWN Luz Brazen COLOSTOMY;  Surgeon: Glenna Fellows, MD;  Location: [facility];  Service: General;  Laterality: N/A;   LAPAROTOMY N/A 07/09/2013   Procedure: EXPLORATORY LAPAROTOMY, COLOSTOMY, SIGMOID COLECTOMY, HARTMANS PROCEDURE;  Surgeon: Mariella Saa, MD;  Location: [facility];  Service: General;  Laterality: N/A;   OOPHORECTOMY     Patient Active Problem List   Diagnosis Date Noted   Palpitations 04/08/2021   Chronic pain 05/28/2020   Esophageal dysphagia 05/28/2020   Gastroesophageal reflux disease without esophagitis 05/28/2020   Lymphopenia 05/28/2020   Primary insomnia 05/28/2020   Pure hypercholesterolemia 05/28/2020   Sleep disorder 05/28/2020   PONV (postoperative nausea and vomiting)    MVP (mitral valve prolapse)    History of nonmelanoma skin cancer    Diverticulitis    Difficulty sleeping    Complication of anesthesia    Colostomy in place [facility])    Cancer (HCC)    Palpitation 06/20/2017   Panic disorder 06/20/2017   Osteoporosis 06/20/2017   Noninflammatory disorder of vulva and perineum 06/20/2017   Mitral valve prolapse 06/20/2017   Low back pain 06/20/2017   Leiomyoma of uterus 06/20/2017   Family history of malignant neoplasm of gastrointestinal tract 06/20/2017   Cardiac dysrhythmia 06/20/2017   Postmenopausal atrophic vaginitis  06/20/2017   Postmenopausal bleeding 06/20/2017   Symptomatic menopausal or female climacteric states 06/20/2017   Essential hypertension 06/20/2017   Chest pain in adult 06/20/2017   Hyperlipidemia 06/20/2017   Elevated LFTs 06/20/2017   Mitral valve disorder 06/20/2017   Atypical chest pain 06/20/2017   Hypertension    Anxiety    Diverticulitis of sigmoid colon s/p colectomy.  Colostomy takedown 05/19/2014 07/25/2013   Perforated sigmoid colon (HCC) 07/25/2013    PCP: Noberto Retort, MD REFERRING PROVIDER: Hurman Horn, MD  REFERRING DIAG: M54.2 (ICD-10-CM) - Cervicalgia  THERAPY DIAG:  Cervicalgia  Dizziness and giddiness  Abnormal posture  ONSET DATE: ~1 year (noted increased pain after raking leaves)  Rationale for Evaluation and Treatment: Rehabilitation  SUBJECTIVE:   SUBJECTIVE STATEMENT:  No changes since she was last here. Neck pain is off and on. It hasn't felt bad today. Has had a couple of bad days since she was here. Notes the muscles at the base of her skull was a little sore. Hasn't needed to take any medication today. Thinks she might have gotten some relief from needling. Haven't had enough improvement to tell what's helping. Dizziness has also varied, she is feeling pretty good. Noticed her dizziness a little more yesterday. Had a day where she had more pain and a little more dizzy. When she was bending over and had to come up, that's when she would feel dizzy. Can feel lightheaded and then some spinning. Does not feel balanced and eyes don't focus right away. Has to feel like she has to grab on. Thinks her glasses come into play with it. Does not have dizziness when laying down, but does have it when coming up.   Pt accompanied by: self  PERTINENT HISTORY: chronic neck pain, worked primarily at a desk  PAIN:  Are you having pain? Yes: NPRS scale: 1-2/10 Pain location: lower cervical Pain description: muscle sore, can radiate up or down (R worse than L), "my head feels too heavy" Aggravating factors: "being awake", bending/leaning forward, using computer  Relieving factors: Heat, exercises, massage  Vitals:   01/16/23 1416  BP: (!) 161/73  Pulse: 68     PRECAUTIONS: None  RED FLAGS: None   WEIGHT BEARING RESTRICTIONS: No  FALLS: Has patient fallen in last 6 months? No  LIVING ENVIRONMENT: Lives with: lives with their spouse Lives in: House/apartment Stairs: No Has following equipment at home: None  PLOF:  Independent  PATIENT GOALS: Decrease neck pain for yard work  OBJECTIVE:  Note: Objective measures were completed at Evaluation unless otherwise noted.  DIAGNOSTIC FINDINGS: 03/27/22 cervical x-ray IMPRESSION: 1. Chronic grade 1 anterolisthesis of C4 on C5 and C5 on C6. 2. Mild degenerative disc disease at C4-C5 and C5-C6. 3. Moderate diffuse facet hypertrophy.  COGNITION: Overall cognitive status: Within functional limits for tasks assessed   SENSATION: "Not usually"  EDEMA:  None  POSTURE:  rounded shoulders, forward head, and increased thoracic kyphosis  Cervical ROM:    Active A/PROM (deg) eval  Flexion 30  Extension 25  Right lateral flexion 18  Left lateral flexion 15  Right rotation 40  Left rotation 35  (Blank rows = not tested)  UPPER EXTREMITY MMT:  MMT Right eval Left eval  Shoulder flexion 3+ 3+  Shoulder extension 4- 4  Shoulder abduction 3+ 3+  Shoulder adduction    Shoulder internal rotation 5 5  Shoulder external rotation 3+ 3+  Middle trapezius 3+ 4  Lower trapezius 3 3+  Elbow flexion    Elbow extension    Wrist flexion    Wrist extension    Wrist ulnar deviation    Wrist radial deviation    Wrist pronation    Wrist supination    Grip strength     (Blank rows = not tested)    TREATMENT:     VESTIBULAR ASSESSMENT   GENERAL OBSERVATION: Ambulates in with no AD, decreased cervical ROM     SYMPTOM BEHAVIOR:   Subjective history: Sometimes when walking, will feel like she is veering. Dizziness happens when bending over or coming to sit back up    Non-Vestibular symptoms: neck pain and headaches   Type of dizziness: Spinning/Vertigo, Unsteady with head/body turns, and Lightheadedness/Faint   Frequency: Every few days    Duration: Seconds   Aggravating factors: Induced by position change: supine to sit and Induced by motion: looking up at the ceiling and bending down to the ground, worse when neck pain is worse    Relieving factors:  ead stationary, rest, and slow movements    Progression of symptoms: worse   OCULOMOTOR EXAM:   Ocular Alignment: normal   Ocular ROM: No Limitations   Spontaneous Nystagmus: absent   Gaze-Induced Nystagmus: absent   Smooth Pursuits: intact   Saccades: intact, feels a pulling in the back of her head       VESTIBULAR - OCULAR REFLEX:    Slow VOR: Normal, feels like she had to focus, felt a little off after stopping    VOR Cancellation: Normal, feels like things are moving after stopping   Head-Impulse Test: HIT Right: negative HIT Left: positive, mild Felt mild dizziness afterwards   Cervical Neck Torsion Test: Negative bilaterally, no dizziness     POSITIONAL TESTING: Right Dix-Hallpike: no nystagmus and a little bit of dizziness coming up  Left Dix-Hallpike: no nystagmus and feels a little bit of dizziness coming back up  Right Sidelying: no nystagmus and feels a little bit of dizziness coming back up Left Sidelying: no nystagmus and feels a little bit of dizziness coming back up   Habituation: Brandt-Daroff: comment: 3 reps each side, pt reporting wooziness when coming upright. Added to HEP     PATIENT EDUCATION: Education details: Findings of vestibular assessment, what PT will continue to address regarding dizziness and neck pain, Austin Miles exercises for habituation to see if it will help wooziness when coming upright  Person educated: Patient Education method: Explanation, Demonstration, and Handouts Education comprehension: verbalized understanding, returned demonstration, and needs further education  HOME EXERCISE PROGRAM: Access Code: [email or web address] URL: [email or web address] Date: 01/16/2023 Prepared by: Sherlie Ban  Exercises - Seated Isometric Cervical Rotation  - 1 x daily - 7 x weekly - 2 sets - 5 reps - 5 sec hold - Seated Levator Scapulae Stretch  - 1 x daily - 7 x weekly - 2 sets - 15 sec hold - Seated Passive Cervical Retraction  - 1 x  daily - 7 x weekly - 2 sets - 10 reps - Shoulder External Rotation and Scapular Retraction  - 1 x daily - 7 x weekly - 2 sets - 10 reps - 3 sec hold - Shoulder External Rotation in 45 Degrees Abduction  - 1 x daily - 7 x weekly - 2 sets - 10 reps - 3 sec hold - Gentle Levator Scapulae Stretch  - 1 x daily - 7 x weekly - 1 sets - 3-5 reps - 30 sec hold - Supine Suboccipital Release with  Tennis Balls  - 1 x daily - 7 x weekly - 1 sets - 1 reps - 5-10 min hold - Brandt-Daroff Vestibular Exercise  - 1-2 x daily - 5 x weekly - 1 sets - 4-5 reps    GOALS: Goals reviewed with patient? Yes  SHORT TERM GOALS: Target date: 01/22/2023   Pt will be ind with initial HEP Baseline: Goal status: INITIAL  2.  Pt will be ind with self management of trigger points and neck pain for sleep and times awake to keep pain </=4/10 Baseline:  Goal status: INITIAL  3.  PT will assess pt's NDI for goal setting Baseline:  Goal status: INITIAL   LONG TERM GOALS: Target date: 02/12/2023   Pt will be ind with management and progression of HEP Baseline:  Goal status: INITIAL  2.  Pt will demo at least 4/5 bilat UE/midback strength for improved postural stability with bending/yard tasks Baseline:  Goal status: INITIAL  3.  Pt will demo increased neck ROM to >/=10 deg in all directions for improved mobility Baseline:  Goal status: INITIAL  4.  Pt will have improved NDI score by >/=7.5 point difference from baseline to demo MCID Baseline:  Goal status: INITIAL  5.  Pt will report greatest neck pain to be no more than 5/10 x 2 weeks Baseline:  Goal status: INITIAL   ASSESSMENT:  CLINICAL IMPRESSION: Today's skilled session focused on vestibular assessment. Pt reports dizziness when bending over or when going from supine > sit. Pt's BP elevated today, but WNL to participate in PT. Pt with normal Slow VOR and VOR cancellation, but pt did report feeling a little off afterwards. Pt with positive HIT to  the L, indicating impaired VOR and pt reporting mild dizziness afterwards. Pt negative for cervical torsion test, indicating that there is not a cervicogenic component to pt's dizziness, but based on nature of pt's dizziness, it seems like there still could be. Pt negative for positional testing, but did have some wooziness when returning upright from DixHallpike and sidelying position. Gave pt Austin Miles exercises for home to see if it will help with habituation. Will continue per POC.    OBJECTIVE IMPAIRMENTS: decreased activity tolerance, decreased endurance, decreased mobility, difficulty walking, decreased ROM, decreased strength, dizziness, increased fascial restrictions, increased muscle spasms, impaired flexibility, impaired UE functional use, postural dysfunction, and pain.   ACTIVITY LIMITATIONS: carrying, lifting, bending, and reach over head  PARTICIPATION LIMITATIONS: meal prep, cleaning, laundry, driving, shopping, community activity, and yard work  PERSONAL FACTORS: Age, Fitness, Past/current experiences, and Time since onset of injury/illness/exacerbation are also affecting patient's functional outcome.   REHAB POTENTIAL: Good  CLINICAL DECISION MAKING: Evolving/moderate complexity  EVALUATION COMPLEXITY: Moderate   PLAN:  PT FREQUENCY: 1x/week  PT DURATION: 6 weeks  PLANNED INTERVENTIONS: 97164- PT Re-evaluation, 97110-Therapeutic exercises, 97530- Therapeutic activity, [id number]- Neuromuscular re-education, 97535- Self Care, [id number]- Manual therapy, [id number]- Canalith repositioning, 97014- Electrical stimulation (unattended), 97033- Ionotophoresis 4mg /ml Dexamethasone, Patient/Family education, Dry Needling, Joint mobilization, Spinal mobilization, Vestibular training, Cryotherapy, and Moist heat  PLAN FOR NEXT SESSION: Assess NDI. Assess response to HEP and modify accordingly. Manual therapy/TPDN as indicated for c-spine. Trial of TENS. Continue postural/shoulder strengthening.  Try Chirp wheel XR.  For vestibular: try VOR x1 and how was Austin Miles?    Sherlie Ban, PT, DPT 01/16/23 3:45 PM

## 2023-01-22 ENCOUNTER — Ambulatory Visit: Payer: Medicare HMO | Admitting: Physical Therapy

## 2023-01-29 ENCOUNTER — Ambulatory Visit: Payer: Medicare HMO | Admitting: Physical Therapy

## 2023-01-29 DIAGNOSIS — M542 Cervicalgia: Secondary | ICD-10-CM | POA: Diagnosis not present

## 2023-01-30 ENCOUNTER — Ambulatory Visit: Payer: Medicare HMO | Attending: Family Medicine | Admitting: Physical Therapy

## 2023-01-30 ENCOUNTER — Other Ambulatory Visit: Payer: Self-pay | Admitting: Cardiology

## 2023-01-30 VITALS — BP 150/90 | HR 87

## 2023-01-30 DIAGNOSIS — M542 Cervicalgia: Secondary | ICD-10-CM | POA: Diagnosis not present

## 2023-01-30 DIAGNOSIS — R293 Abnormal posture: Secondary | ICD-10-CM | POA: Diagnosis not present

## 2023-01-30 DIAGNOSIS — R42 Dizziness and giddiness: Secondary | ICD-10-CM | POA: Diagnosis not present

## 2023-01-30 DIAGNOSIS — I491 Atrial premature depolarization: Secondary | ICD-10-CM

## 2023-01-30 DIAGNOSIS — M6281 Muscle weakness (generalized): Secondary | ICD-10-CM | POA: Diagnosis not present

## 2023-01-30 NOTE — Therapy (Signed)
OUTPATIENT PHYSICAL THERAPY VESTIBULAR/CERVICAL TREATMENT     Patient Name: Kristen Reyes MRN: [id number] DOB:10-16-1950, 72 y.o., female Today's Date: 01/30/2023  END OF SESSION:  PT End of Session - 01/30/23 1057     Visit Number 4    Number of Visits 6    Date for PT Re-Evaluation 02/12/23    Authorization Type [employer]    PT Start Time 1055    PT Stop Time 1145    PT Time Calculation (min) 50 min    Activity Tolerance Patient tolerated treatment well    Behavior During Therapy [facility] for tasks assessed/performed               Past Medical History:  Diagnosis Date   Anxiety    Atypical chest pain 06/20/2017   Cancer (HCC)    skin cancer   Cardiac dysrhythmia 06/20/2017   Chest pain in adult 06/20/2017   Chronic pain 05/28/2020   Colostomy in place [facility])    Complication of anesthesia    Difficulty sleeping    Diverticulitis    Diverticulitis of sigmoid colon s/p colectomy.  Colostomy takedown 05/19/2014 07/25/2013   Elevated LFTs 06/20/2017   Esophageal dysphagia 05/28/2020   Essential hypertension 06/20/2017   Family history of malignant neoplasm of gastrointestinal tract 06/20/2017   Gastroesophageal reflux disease without esophagitis 05/28/2020   History of nonmelanoma skin cancer    Hyperlipidemia 06/20/2017   Hypertension    Leiomyoma of uterus 06/20/2017   Low back pain 06/20/2017   Lymphopenia 05/28/2020   Mitral valve disorder 06/20/2017   Mitral valve prolapse 06/20/2017   MVP (mitral valve prolapse)    Noninflammatory disorder of vulva and perineum 06/20/2017   Osteoporosis    Palpitation 06/20/2017   Panic disorder 06/20/2017   Perforated sigmoid colon (HCC) 07/25/2013   PONV (postoperative nausea and vomiting)    Postmenopausal atrophic vaginitis 06/20/2017   Postmenopausal bleeding 06/20/2017   Primary insomnia 05/28/2020   Pure hypercholesterolemia 05/28/2020   Sleep disorder 05/28/2020   Symptomatic menopausal or female climacteric states 06/20/2017   Past Surgical  History:  Procedure Laterality Date   CARDIAC CATHETERIZATION  2002   COLON SURGERY     COLOSTOMY TAKEDOWN N/A 05/19/2014   Procedure: TAKEDOWN Luz Brazen COLOSTOMY;  Surgeon: Glenna Fellows, MD;  Location: [facility];  Service: General;  Laterality: N/A;   LAPAROTOMY N/A 07/09/2013   Procedure: EXPLORATORY LAPAROTOMY, COLOSTOMY, SIGMOID COLECTOMY, HARTMANS PROCEDURE;  Surgeon: Mariella Saa, MD;  Location: [facility];  Service: General;  Laterality: N/A;   OOPHORECTOMY     Patient Active Problem List   Diagnosis Date Noted   Palpitations 04/08/2021   Chronic pain 05/28/2020   Esophageal dysphagia 05/28/2020   Gastroesophageal reflux disease without esophagitis 05/28/2020   Lymphopenia 05/28/2020   Primary insomnia 05/28/2020   Pure hypercholesterolemia 05/28/2020   Sleep disorder 05/28/2020   PONV (postoperative nausea and vomiting)    MVP (mitral valve prolapse)    History of nonmelanoma skin cancer    Diverticulitis    Difficulty sleeping    Complication of anesthesia    Colostomy in place [facility])    Cancer (HCC)    Palpitation 06/20/2017   Panic disorder 06/20/2017   Osteoporosis 06/20/2017   Noninflammatory disorder of vulva and perineum 06/20/2017   Mitral valve prolapse 06/20/2017   Low back pain 06/20/2017   Leiomyoma of uterus 06/20/2017   Family history of malignant neoplasm of gastrointestinal tract 06/20/2017   Cardiac dysrhythmia 06/20/2017   Postmenopausal atrophic  vaginitis 06/20/2017   Postmenopausal bleeding 06/20/2017   Symptomatic menopausal or female climacteric states 06/20/2017   Essential hypertension 06/20/2017   Chest pain in adult 06/20/2017   Hyperlipidemia 06/20/2017   Elevated LFTs 06/20/2017   Mitral valve disorder 06/20/2017   Atypical chest pain 06/20/2017   Hypertension    Anxiety    Diverticulitis of sigmoid colon s/p colectomy.  Colostomy takedown 05/19/2014 07/25/2013   Perforated sigmoid colon (HCC) 07/25/2013    PCP: Noberto Retort, MD REFERRING PROVIDER: Hurman Horn, MD  REFERRING DIAG: M54.2 (ICD-10-CM) - Cervicalgia  THERAPY DIAG:  Cervicalgia  Dizziness and giddiness  Abnormal posture  Muscle weakness (generalized)  ONSET DATE: ~1 year (noted increased pain after raking leaves)  Rationale for Evaluation and Treatment: Rehabilitation  SUBJECTIVE:   SUBJECTIVE STATEMENT:  Pt reports her neck pain is better today, not rated. Pt does continue to have some soreness in her suboccipital region. Pt does feel like her equilibrium is off today and reports that she notices it more after driving. Dizziness rated 1-2, "low but noticeable".  Pt reports that it was hard to tell if DN was helpful, did have some soreness the next day. Pt does all of her exercises every day except she rarely does the suboccipital release with the tennis ball due to time constraints.  Pt has also messaged her doctor about her elevated BP, it was high yesterday but then did come down in the afternoon.  Pt accompanied by: self  PERTINENT HISTORY: chronic neck pain, worked primarily at a desk  PAIN:  Are you having pain? Yes: NPRS scale: not rated/10 Pain location: lower cervical Pain description: muscle sore, can radiate up or down (R worse than L), "my head feels too heavy" Aggravating factors: "being awake", bending/leaning forward, using computer  Relieving factors: Heat, exercises, massage  Vitals:   01/30/23 1111 01/30/23 1112  BP: (!) 164/99 (!) 150/90  Pulse: 87       PRECAUTIONS: None  RED FLAGS: None   WEIGHT BEARING RESTRICTIONS: No  FALLS: Has patient fallen in last 6 months? No  LIVING ENVIRONMENT: Lives with: lives with their spouse Lives in: House/apartment Stairs: No Has following equipment at home: None  PLOF: Independent  PATIENT GOALS: Decrease neck pain for yard work  OBJECTIVE:  Note: Objective measures were completed at Evaluation unless otherwise noted.  DIAGNOSTIC FINDINGS:  03/27/22 cervical x-ray IMPRESSION: 1. Chronic grade 1 anterolisthesis of C4 on C5 and C5 on C6. 2. Mild degenerative disc disease at C4-C5 and C5-C6. 3. Moderate diffuse facet hypertrophy.  COGNITION: Overall cognitive status: Within functional limits for tasks assessed   SENSATION: "Not usually"  EDEMA:  None  POSTURE:  rounded shoulders, forward head, and increased thoracic kyphosis  Cervical ROM:    Active A/PROM (deg) eval  Flexion 30  Extension 25  Right lateral flexion 18  Left lateral flexion 15  Right rotation 40  Left rotation 35  (Blank rows = not tested)  UPPER EXTREMITY MMT:  MMT Right eval Left eval  Shoulder flexion 3+ 3+  Shoulder extension 4- 4  Shoulder abduction 3+ 3+  Shoulder adduction    Shoulder internal rotation 5 5  Shoulder external rotation 3+ 3+  Middle trapezius 3+ 4  Lower trapezius 3 3+  Elbow flexion    Elbow extension    Wrist flexion    Wrist extension    Wrist ulnar deviation    Wrist radial deviation    Wrist pronation  Wrist supination    Grip strength     (Blank rows = not tested)   VESTIBULAR ASSESSMENT   GENERAL OBSERVATION: Ambulates in with no AD, decreased cervical ROM     SYMPTOM BEHAVIOR:   Subjective history: Sometimes when walking, will feel like she is veering. Dizziness happens when bending over or coming to sit back up    Non-Vestibular symptoms: neck pain and headaches   Type of dizziness: Spinning/Vertigo, Unsteady with head/body turns, and Lightheadedness/Faint   Frequency: Every few days    Duration: Seconds   Aggravating factors: Induced by position change: supine to sit and Induced by motion: looking up at the ceiling and bending down to the ground, worse when neck pain is worse    Relieving factors: ead stationary, rest, and slow movements    Progression of symptoms: worse   OCULOMOTOR EXAM:   Ocular Alignment: normal   Ocular ROM: No Limitations   Spontaneous Nystagmus:  absent   Gaze-Induced Nystagmus: absent   Smooth Pursuits: intact   Saccades: intact, feels a pulling in the back of her head       VESTIBULAR - OCULAR REFLEX:    Slow VOR: Normal, feels like she had to focus, felt a little off after stopping    VOR Cancellation: Normal, feels like things are moving after stopping   Head-Impulse Test: HIT Right: negative HIT Left: positive, mild Felt mild dizziness afterwards   Cervical Neck Torsion Test: Negative bilaterally, no dizziness     POSITIONAL TESTING: Right Dix-Hallpike: no nystagmus and a little bit of dizziness coming up  Left Dix-Hallpike: no nystagmus and feels a little bit of dizziness coming back up  Right Sidelying: no nystagmus and feels a little bit of dizziness coming back up Left Sidelying: no nystagmus and feels a little bit of dizziness coming back up   Habituation: Brandt-Daroff: comment: 3 reps each side, pt reporting wooziness when coming upright. Added to HEP     TODAY'S TREATMENT: TherEx Seated cervical retraction x 5 reps B +rotation and flexion feels "tightness" in posterior portion of her neck  Seated scapular retractions x 10 reps  Supine thoracic mobilization over towel roll shoulder flexion x 10 reps Open book chest stretch 3 x 30 sec Upper pec chest stretch 3 x 30 sec  TherAct Vitals:   01/30/23 1111 01/30/23 1112  BP: (!) 164/99 (!) 150/90  Pulse: 87    Seated BP initially assessed with digital monitor, first reading above obtained. Reassessed via manual cuff and 2nd reading above obtained. Vitals within safe limits for participation in therapy session despite being elevated, encouraged pt to reach out to her PCP again regarding elevated readings and encouraged her to keep a log at home.   Trigger Point Dry-Needling  Treatment instructions: Expect mild to moderate muscle soreness. S/S of pneumothorax if dry needled over a lung field, and to seek immediate medical attention should they occur.  Patient verbalized understanding of these instructions and education.  Patient Consent Given: Yes Education handout provided: Previously provided Muscles treated: R upper trap Treatment response/outcome: deep ache/muscle cramp; muscle twitch detected; pt has onset of "wooziness" following treatment that resolves after a few minutes - deferred any further needling this session due to patient response  NDI: 15/50   PATIENT EDUCATION: Education details: TPDN, PT POC, added to HEP Person educated: Patient Education method: Explanation, Demonstration, and Handouts Education comprehension: verbalized understanding, returned demonstration, and needs further education  HOME EXERCISE PROGRAM: Access Code: [email or web address] URL: [email or web address]  Date: 01/16/2023 Prepared by: Sherlie Ban  Exercises - Seated Isometric Cervical Rotation  - 1 x daily - 7 x weekly - 2 sets - 5 reps - 5 sec hold - Seated Levator Scapulae Stretch  - 1 x daily - 7 x weekly - 2 sets - 15 sec hold - Seated Passive Cervical Retraction  - 1 x daily - 7 x weekly - 2 sets - 10 reps - Shoulder External Rotation and Scapular Retraction  - 1 x daily - 7 x weekly - 2 sets - 10 reps - 3 sec hold - Shoulder External Rotation in 45 Degrees Abduction  - 1 x daily - 7 x weekly - 2 sets - 10 reps - 3 sec hold - Gentle Levator Scapulae Stretch  - 1 x daily - 7 x weekly - 1 sets - 3-5 reps - 30 sec hold - Supine Suboccipital Release with Tennis Balls  - 1 x daily - 7 x weekly - 1 sets - 1 reps - 5-10 min hold - Brandt-Daroff Vestibular Exercise  - 1-2 x daily - 5 x weekly - 1 sets - 4-5 reps - Scapular Retraction with Resistance  - 1 x daily - 7 x weekly - 3 sets - 10 reps - Seated Cervical Retraction and Rotation  - 1 x daily - 7 x weekly - 2 sets - 5 reps - Supine Thoracic Mobilization Towel Roll Vertical with Arm Stretch  - 1 x daily - 7 x weekly - 3 sets - 10 reps - Open Book Chest Stretch on Towel Roll  - 1 x daily -  7 x weekly - 1 sets - 3-5 reps - 30 sec hold    GOALS: Goals reviewed with patient? Yes  SHORT TERM GOALS: Target date: 01/22/2023  Pt will be ind with initial HEP Baseline: Goal status: MET  2.  Pt will be ind with self management of trigger points and neck pain for sleep and times awake to keep pain </=4/10 Baseline:  Goal status: MET  3.  PT will assess pt's NDI for goal setting Baseline:  Goal status: MET   LONG TERM GOALS: Target date: 02/12/2023   Pt will be ind with management and progression of HEP Baseline:  Goal status: INITIAL  2.  Pt will demo at least 4/5 bilat UE/midback strength for improved postural stability with bending/yard tasks Baseline:  Goal status: INITIAL  3.  Pt will demo increased neck ROM to >/=10 deg in all directions for improved mobility Baseline:  Goal status: INITIAL  4.  Pt will have improved NDI score by >/=5 point difference from baseline Baseline: 15/50 (12/3) Goal status: REVISED  5.  Pt will report greatest neck pain to be no more than 5/10 x 2 weeks Baseline:  Goal status: INITIAL   ASSESSMENT:  CLINICAL IMPRESSION: Emphasis of skilled PT session on assessing STG, performing TPDN, and adding to HEP as well as performing BP assessments throughout session. Pt has met 3/3 STG and exhibits an overall improvement in her neck pain symptoms from initial evaluation. Pt does have limited tolerance for DN this session so deferred further treatment via this method this date. Pt also limited by elevated BP though her BP does decrease to within safe limits during session. Pt encouraged to follow up with her PCP regarding ongoing hypertension. Pt continues to benefit from skilled therapy services to work towards increased independence with management of pain symptoms and for postural retraining. Continue POC.    OBJECTIVE IMPAIRMENTS:  decreased activity tolerance, decreased endurance, decreased mobility, difficulty walking, decreased ROM,  decreased strength, dizziness, increased fascial restrictions, increased muscle spasms, impaired flexibility, impaired UE functional use, postural dysfunction, and pain.   ACTIVITY LIMITATIONS: carrying, lifting, bending, and reach over head  PARTICIPATION LIMITATIONS: meal prep, cleaning, laundry, driving, shopping, community activity, and yard work  PERSONAL FACTORS: Age, Fitness, Past/current experiences, and Time since onset of injury/illness/exacerbation are also affecting patient's functional outcome.   REHAB POTENTIAL: Good  CLINICAL DECISION MAKING: Evolving/moderate complexity  EVALUATION COMPLEXITY: Moderate   PLAN:  PT FREQUENCY: 1x/week  PT DURATION: 6 weeks  PLANNED INTERVENTIONS: 97164- PT Re-evaluation, 97110-Therapeutic exercises, 97530- Therapeutic activity, [id number]- Neuromuscular re-education, 97535- Self Care, 16109- Manual therapy, 95992- Canalith repositioning, 97014- Electrical stimulation (unattended), 97033- Ionotophoresis 4mg /ml Dexamethasone, Patient/Family education, Dry Needling, Joint mobilization, Spinal mobilization, Vestibular training, Cryotherapy, and Moist heat  PLAN FOR NEXT SESSION: Assess response to HEP and modify accordingly. Manual therapy/TPDN as indicated for c-spine (doesn't love TPDN). Trial of TENS. Continue postural/shoulder strengthening. Try Chirp wheel XR. Postural stabilization.  For vestibular: try VOR x1 and how was Austin Miles?    Peter Congo, PT, DPT, CSRS  01/30/23 11:48 AM

## 2023-02-02 ENCOUNTER — Other Ambulatory Visit: Payer: Self-pay | Admitting: Sports Medicine

## 2023-02-02 DIAGNOSIS — M542 Cervicalgia: Secondary | ICD-10-CM

## 2023-02-05 ENCOUNTER — Ambulatory Visit: Payer: Medicare HMO | Admitting: Physical Therapy

## 2023-02-05 VITALS — BP 132/82 | HR 68

## 2023-02-05 DIAGNOSIS — R42 Dizziness and giddiness: Secondary | ICD-10-CM

## 2023-02-05 DIAGNOSIS — R293 Abnormal posture: Secondary | ICD-10-CM

## 2023-02-05 DIAGNOSIS — M6281 Muscle weakness (generalized): Secondary | ICD-10-CM | POA: Diagnosis not present

## 2023-02-05 DIAGNOSIS — M542 Cervicalgia: Secondary | ICD-10-CM | POA: Diagnosis not present

## 2023-02-05 NOTE — Therapy (Signed)
OUTPATIENT PHYSICAL THERAPY VESTIBULAR/CERVICAL TREATMENT     Patient Name: Kristen Reyes MRN: [id number] DOB:Nov 01, 1950, 72 y.o., female Today's Date: 02/05/2023  END OF SESSION:  PT End of Session - 02/05/23 1101     Visit Number 5    Number of Visits 6    Date for PT Re-Evaluation 02/12/23    Authorization Type [employer]    PT Start Time 1100    PT Stop Time 1146    PT Time Calculation (min) 46 min    Activity Tolerance Patient tolerated treatment well    Behavior During Therapy [facility] for tasks assessed/performed                Past Medical History:  Diagnosis Date   Anxiety    Atypical chest pain 06/20/2017   Cancer (HCC)    skin cancer   Cardiac dysrhythmia 06/20/2017   Chest pain in adult 06/20/2017   Chronic pain 05/28/2020   Colostomy in place [facility])    Complication of anesthesia    Difficulty sleeping    Diverticulitis    Diverticulitis of sigmoid colon s/p colectomy.  Colostomy takedown 05/19/2014 07/25/2013   Elevated LFTs 06/20/2017   Esophageal dysphagia 05/28/2020   Essential hypertension 06/20/2017   Family history of malignant neoplasm of gastrointestinal tract 06/20/2017   Gastroesophageal reflux disease without esophagitis 05/28/2020   History of nonmelanoma skin cancer    Hyperlipidemia 06/20/2017   Hypertension    Leiomyoma of uterus 06/20/2017   Low back pain 06/20/2017   Lymphopenia 05/28/2020   Mitral valve disorder 06/20/2017   Mitral valve prolapse 06/20/2017   MVP (mitral valve prolapse)    Noninflammatory disorder of vulva and perineum 06/20/2017   Osteoporosis    Palpitation 06/20/2017   Panic disorder 06/20/2017   Perforated sigmoid colon (HCC) 07/25/2013   PONV (postoperative nausea and vomiting)    Postmenopausal atrophic vaginitis 06/20/2017   Postmenopausal bleeding 06/20/2017   Primary insomnia 05/28/2020   Pure hypercholesterolemia 05/28/2020   Sleep disorder 05/28/2020   Symptomatic menopausal or female climacteric states 06/20/2017   Past  Surgical History:  Procedure Laterality Date   CARDIAC CATHETERIZATION  2002   COLON SURGERY     COLOSTOMY TAKEDOWN N/A 05/19/2014   Procedure: TAKEDOWN Luz Brazen COLOSTOMY;  Surgeon: Glenna Fellows, MD;  Location: [facility];  Service: General;  Laterality: N/A;   LAPAROTOMY N/A 07/09/2013   Procedure: EXPLORATORY LAPAROTOMY, COLOSTOMY, SIGMOID COLECTOMY, HARTMANS PROCEDURE;  Surgeon: Mariella Saa, MD;  Location: [facility];  Service: General;  Laterality: N/A;   OOPHORECTOMY     Patient Active Problem List   Diagnosis Date Noted   Palpitations 04/08/2021   Chronic pain 05/28/2020   Esophageal dysphagia 05/28/2020   Gastroesophageal reflux disease without esophagitis 05/28/2020   Lymphopenia 05/28/2020   Primary insomnia 05/28/2020   Pure hypercholesterolemia 05/28/2020   Sleep disorder 05/28/2020   PONV (postoperative nausea and vomiting)    MVP (mitral valve prolapse)    History of nonmelanoma skin cancer    Diverticulitis    Difficulty sleeping    Complication of anesthesia    Colostomy in place [facility])    Cancer (HCC)    Palpitation 06/20/2017   Panic disorder 06/20/2017   Osteoporosis 06/20/2017   Noninflammatory disorder of vulva and perineum 06/20/2017   Mitral valve prolapse 06/20/2017   Low back pain 06/20/2017   Leiomyoma of uterus 06/20/2017   Family history of malignant neoplasm of gastrointestinal tract 06/20/2017   Cardiac dysrhythmia 06/20/2017   Postmenopausal  atrophic vaginitis 06/20/2017   Postmenopausal bleeding 06/20/2017   Symptomatic menopausal or female climacteric states 06/20/2017   Essential hypertension 06/20/2017   Chest pain in adult 06/20/2017   Hyperlipidemia 06/20/2017   Elevated LFTs 06/20/2017   Mitral valve disorder 06/20/2017   Atypical chest pain 06/20/2017   Hypertension    Anxiety    Diverticulitis of sigmoid colon s/p colectomy.  Colostomy takedown 05/19/2014 07/25/2013   Perforated sigmoid colon (HCC) 07/25/2013    PCP: Noberto Retort, MD REFERRING PROVIDER: Hurman Horn, MD  REFERRING DIAG: M54.2 (ICD-10-CM) - Cervicalgia  THERAPY DIAG:  Cervicalgia  Dizziness and giddiness  Abnormal posture  Muscle weakness (generalized)  ONSET DATE: ~1 year (noted increased pain after raking leaves)  Rationale for Evaluation and Treatment: Rehabilitation  SUBJECTIVE:   SUBJECTIVE STATEMENT:  Pt reports that she feels good "most of the time". Pt reports that yesterday she had more pain/tightness along the back of her head, woke up today with a little bit of headache. Pt reports that she is not doing the tennis ball exercise regularly, does feel better when she uses them on her bed vs on the floor (too much pressure).  Pt was taking a walk yesterday and noticed her posture was better and more upright without having to think about it. Pt was looking up at her trees yesterday (tried hanging Christmas balls with her husband) and this exacerbated her pain. Pt reports that she doesn't have any dizziness today but does feel like she is "under water".  Pt's doctor sent something for her BP and her readings have been better at home, brings in BP log and vitals WNL.  Pt accompanied by: self  PERTINENT HISTORY: chronic neck pain, worked primarily at a desk  PAIN:  Are you having pain? Yes: NPRS scale: not rated/10 Pain location: lower cervical Pain description: muscle sore, can radiate up or down (R worse than L), "my head feels too heavy" Aggravating factors: "being awake", bending/leaning forward, using computer  Relieving factors: Heat, exercises, massage  Vitals:   02/05/23 1109  BP: 132/82  Pulse: 68       PRECAUTIONS: None  RED FLAGS: None   WEIGHT BEARING RESTRICTIONS: No  FALLS: Has patient fallen in last 6 months? No  LIVING ENVIRONMENT: Lives with: lives with their spouse Lives in: House/apartment Stairs: No Has following equipment at home: None  PLOF: Independent  PATIENT GOALS:  Decrease neck pain for yard work  OBJECTIVE:  Note: Objective measures were completed at Evaluation unless otherwise noted.  DIAGNOSTIC FINDINGS: 03/27/22 cervical x-ray IMPRESSION: 1. Chronic grade 1 anterolisthesis of C4 on C5 and C5 on C6. 2. Mild degenerative disc disease at C4-C5 and C5-C6. 3. Moderate diffuse facet hypertrophy.  COGNITION: Overall cognitive status: Within functional limits for tasks assessed   SENSATION: "Not usually"  EDEMA:  None  POSTURE:  rounded shoulders, forward head, and increased thoracic kyphosis  Cervical ROM:    Active A/PROM (deg) eval  Flexion 30  Extension 25  Right lateral flexion 18  Left lateral flexion 15  Right rotation 40  Left rotation 35  (Blank rows = not tested)  UPPER EXTREMITY MMT:  MMT Right eval Left eval  Shoulder flexion 3+ 3+  Shoulder extension 4- 4  Shoulder abduction 3+ 3+  Shoulder adduction    Shoulder internal rotation 5 5  Shoulder external rotation 3+ 3+  Middle trapezius 3+ 4  Lower trapezius 3 3+  Elbow flexion    Elbow extension  Wrist flexion    Wrist extension    Wrist ulnar deviation    Wrist radial deviation    Wrist pronation    Wrist supination    Grip strength     (Blank rows = not tested)   VESTIBULAR ASSESSMENT   GENERAL OBSERVATION: Ambulates in with no AD, decreased cervical ROM     SYMPTOM BEHAVIOR:   Subjective history: Sometimes when walking, will feel like she is veering. Dizziness happens when bending over or coming to sit back up    Non-Vestibular symptoms: neck pain and headaches   Type of dizziness: Spinning/Vertigo, Unsteady with head/body turns, and Lightheadedness/Faint   Frequency: Every few days    Duration: Seconds   Aggravating factors: Induced by position change: supine to sit and Induced by motion: looking up at the ceiling and bending down to the ground, worse when neck pain is worse    Relieving factors: ead stationary, rest, and slow movements     Progression of symptoms: worse   OCULOMOTOR EXAM:   Ocular Alignment: normal   Ocular ROM: No Limitations   Spontaneous Nystagmus: absent   Gaze-Induced Nystagmus: absent   Smooth Pursuits: intact   Saccades: intact, feels a pulling in the back of her head       VESTIBULAR - OCULAR REFLEX:    Slow VOR: Normal, feels like she had to focus, felt a little off after stopping    VOR Cancellation: Normal, feels like things are moving after stopping   Head-Impulse Test: HIT Right: negative HIT Left: positive, mild Felt mild dizziness afterwards   Cervical Neck Torsion Test: Negative bilaterally, no dizziness     POSITIONAL TESTING: Right Dix-Hallpike: no nystagmus and a little bit of dizziness coming up  Left Dix-Hallpike: no nystagmus and feels a little bit of dizziness coming back up  Right Sidelying: no nystagmus and feels a little bit of dizziness coming back up Left Sidelying: no nystagmus and feels a little bit of dizziness coming back up   Habituation: Brandt-Daroff: comment: 3 reps each side, pt reporting wooziness when coming upright. Added to HEP     TODAY'S TREATMENT: TherEx For shoulder strengthening and improved postural stability: Seated shoulder flexion with 3# dumbbells 2 x 10 reps B Seated shoulder abduction with 1# dumbbells 2 x 10 reps B Seated shoulder scaption with 1# dumbbells 2 x 10 reps B Supine serratus punch 2 x 10 reps B with progression from 1# dumbbell to 3#  Added to HEP, see bolded below  TherAct Vitals:   02/05/23 1109  BP: 132/82  Pulse: 68  Assessed in LUE in sitting. Vitals WNL this session.  Trial of Chirp Wheel XR x 5 min, mild pain upon initial use but does get more tolerable with progression. Provided her with handout for where to purchase if necessary.   PATIENT EDUCATION: Education details: PT POC, added to HEP, use of Chirp Wheel XR Person educated: Patient Education method: Programmer, multimedia, Facilities manager, and Handouts Education  comprehension: verbalized understanding, returned demonstration, and needs further education  HOME EXERCISE PROGRAM: Access Code: [email or web address] URL: [email or web address] Date: 01/16/2023 Prepared by: Sherlie Ban  Exercises - Seated Isometric Cervical Rotation  - 1 x daily - 7 x weekly - 2 sets - 5 reps - 5 sec hold - Seated Levator Scapulae Stretch  - 1 x daily - 7 x weekly - 2 sets - 15 sec hold - Seated Passive Cervical Retraction  - 1 x daily - 7 x weekly - 2 sets -  10 reps - Shoulder External Rotation and Scapular Retraction  - 1 x daily - 7 x weekly - 2 sets - 10 reps - 3 sec hold - Shoulder External Rotation in 45 Degrees Abduction  - 1 x daily - 7 x weekly - 2 sets - 10 reps - 3 sec hold - Gentle Levator Scapulae Stretch  - 1 x daily - 7 x weekly - 1 sets - 3-5 reps - 30 sec hold - Supine Suboccipital Release with Tennis Balls  - 1 x daily - 7 x weekly - 1 sets - 1 reps - 5-10 min hold - Brandt-Daroff Vestibular Exercise  - 1-2 x daily - 5 x weekly - 1 sets - 4-5 reps - Scapular Retraction with Resistance  - 1 x daily - 7 x weekly - 3 sets - 10 reps - Seated Cervical Retraction and Rotation  - 1 x daily - 7 x weekly - 2 sets - 5 reps - Supine Thoracic Mobilization Towel Roll Vertical with Arm Stretch  - 1 x daily - 7 x weekly - 3 sets - 10 reps - Open Book Chest Stretch on Towel Roll  - 1 x daily - 7 x weekly - 1 sets - 3-5 reps - 30 sec hold - Seated Shoulder Flexion Full Range  - 1 x daily - 7 x weekly - 1-3 sets - 10 reps - Seated Shoulder Abduction Full Range  - 1 x daily - 7 x weekly - 1-3 sets - 10 reps - Seated Bilateral Shoulder Scaption with Dumbbells  - 1 x daily - 7 x weekly - 1-3 sets - 10 reps - Supine Scapular Protraction in Flexion with Dumbbells  - 1 x daily - 7 x weekly - 1-3 sets - 10 reps    GOALS: Goals reviewed with patient? Yes  SHORT TERM GOALS: Target date: 01/22/2023  Pt will be ind with initial HEP Baseline: Goal status: MET  2.  Pt  will be ind with self management of trigger points and neck pain for sleep and times awake to keep pain </=4/10 Baseline:  Goal status: MET  3.  PT will assess pt's NDI for goal setting Baseline:  Goal status: MET   LONG TERM GOALS: Target date: 02/12/2023   Pt will be ind with management and progression of HEP Baseline:  Goal status: INITIAL  2.  Pt will demo at least 4/5 bilat UE/midback strength for improved postural stability with bending/yard tasks Baseline:  Goal status: INITIAL  3.  Pt will demo increased neck ROM to >/=10 deg in all directions for improved mobility Baseline:  Goal status: INITIAL  4.  Pt will have improved NDI score by >/=5 point difference from baseline Baseline: 15/50 (12/3) Goal status: REVISED  5.  Pt will report greatest neck pain to be no more than 5/10 x 2 weeks Baseline:  Goal status: INITIAL   ASSESSMENT:  CLINICAL IMPRESSION: Emphasis of skilled PT session on trialing Chirp Wheel XR and adding postural stabilization exercises to HEP. Pt does feel that Chirp Wheel is a little intense for right now, could benefit from future use if tennis balls are no longer effective. Pt with good performance of postural stabilization exercises, challenged with 1-2# dumbbells. Pt continues to benefit from skilled therapy services to review her HEP and assess LTGs. Continue POC.    OBJECTIVE IMPAIRMENTS: decreased activity tolerance, decreased endurance, decreased mobility, difficulty walking, decreased ROM, decreased strength, dizziness, increased fascial restrictions, increased muscle spasms, impaired flexibility, impaired  UE functional use, postural dysfunction, and pain.   ACTIVITY LIMITATIONS: carrying, lifting, bending, and reach over head  PARTICIPATION LIMITATIONS: meal prep, cleaning, laundry, driving, shopping, community activity, and yard work  PERSONAL FACTORS: Age, Fitness, Past/current experiences, and Time since onset of  injury/illness/exacerbation are also affecting patient's functional outcome.   REHAB POTENTIAL: Good  CLINICAL DECISION MAKING: Evolving/moderate complexity  EVALUATION COMPLEXITY: Moderate   PLAN:  PT FREQUENCY: 1x/week  PT DURATION: 6 weeks  PLANNED INTERVENTIONS: 97164- PT Re-evaluation, 97110-Therapeutic exercises, 97530- Therapeutic activity, [id number]- Neuromuscular re-education, 97535- Self Care, [id number]- Manual therapy, 95992- Canalith repositioning, 97014- Electrical stimulation (unattended), 97033- Ionotophoresis 4mg /ml Dexamethasone, Patient/Family education, Dry Needling, Joint mobilization, Spinal mobilization, Vestibular training, Cryotherapy, and Moist heat  PLAN FOR NEXT SESSION: assess LTG and d/c if patient agreeable  For vestibular: try VOR x1 and how was Austin Miles?    Peter Congo, PT, DPT, CSRS  02/05/23 11:46 AM

## 2023-02-12 ENCOUNTER — Ambulatory Visit: Payer: Medicare HMO | Admitting: Physical Therapy

## 2023-02-12 DIAGNOSIS — R293 Abnormal posture: Secondary | ICD-10-CM | POA: Diagnosis not present

## 2023-02-12 DIAGNOSIS — M6281 Muscle weakness (generalized): Secondary | ICD-10-CM

## 2023-02-12 DIAGNOSIS — R42 Dizziness and giddiness: Secondary | ICD-10-CM

## 2023-02-12 DIAGNOSIS — M542 Cervicalgia: Secondary | ICD-10-CM | POA: Diagnosis not present

## 2023-02-12 NOTE — Therapy (Signed)
OUTPATIENT PHYSICAL THERAPY VESTIBULAR/CERVICAL TREATMENT    Patient Name: Kristen Reyes MRN: [id number] DOB:1950/10/08, 72 y.o., female Today's Date: 02/12/2023  END OF SESSION:  PT End of Session - 02/12/23 1017     Visit Number 6    Number of Visits 12    Date for PT Re-Evaluation 03/26/23    Authorization Type [employer]    PT Start Time 1017    PT Stop Time 1100    PT Time Calculation (min) 43 min    Activity Tolerance Patient tolerated treatment well    Behavior During Therapy [facility] for tasks assessed/performed                 Past Medical History:  Diagnosis Date   Anxiety    Atypical chest pain 06/20/2017   Cancer (HCC)    skin cancer   Cardiac dysrhythmia 06/20/2017   Chest pain in adult 06/20/2017   Chronic pain 05/28/2020   Colostomy in place [facility])    Complication of anesthesia    Difficulty sleeping    Diverticulitis    Diverticulitis of sigmoid colon s/p colectomy.  Colostomy takedown 05/19/2014 07/25/2013   Elevated LFTs 06/20/2017   Esophageal dysphagia 05/28/2020   Essential hypertension 06/20/2017   Family history of malignant neoplasm of gastrointestinal tract 06/20/2017   Gastroesophageal reflux disease without esophagitis 05/28/2020   History of nonmelanoma skin cancer    Hyperlipidemia 06/20/2017   Hypertension    Leiomyoma of uterus 06/20/2017   Low back pain 06/20/2017   Lymphopenia 05/28/2020   Mitral valve disorder 06/20/2017   Mitral valve prolapse 06/20/2017   MVP (mitral valve prolapse)    Noninflammatory disorder of vulva and perineum 06/20/2017   Osteoporosis    Palpitation 06/20/2017   Panic disorder 06/20/2017   Perforated sigmoid colon (HCC) 07/25/2013   PONV (postoperative nausea and vomiting)    Postmenopausal atrophic vaginitis 06/20/2017   Postmenopausal bleeding 06/20/2017   Primary insomnia 05/28/2020   Pure hypercholesterolemia 05/28/2020   Sleep disorder 05/28/2020   Symptomatic menopausal or female climacteric states 06/20/2017   Past  Surgical History:  Procedure Laterality Date   CARDIAC CATHETERIZATION  2002   COLON SURGERY     COLOSTOMY TAKEDOWN N/A 05/19/2014   Procedure: TAKEDOWN Luz Brazen COLOSTOMY;  Surgeon: Glenna Fellows, MD;  Location: [facility];  Service: General;  Laterality: N/A;   LAPAROTOMY N/A 07/09/2013   Procedure: EXPLORATORY LAPAROTOMY, COLOSTOMY, SIGMOID COLECTOMY, HARTMANS PROCEDURE;  Surgeon: Mariella Saa, MD;  Location: [facility];  Service: General;  Laterality: N/A;   OOPHORECTOMY     Patient Active Problem List   Diagnosis Date Noted   Palpitations 04/08/2021   Chronic pain 05/28/2020   Esophageal dysphagia 05/28/2020   Gastroesophageal reflux disease without esophagitis 05/28/2020   Lymphopenia 05/28/2020   Primary insomnia 05/28/2020   Pure hypercholesterolemia 05/28/2020   Sleep disorder 05/28/2020   PONV (postoperative nausea and vomiting)    MVP (mitral valve prolapse)    History of nonmelanoma skin cancer    Diverticulitis    Difficulty sleeping    Complication of anesthesia    Colostomy in place [facility])    Cancer (HCC)    Palpitation 06/20/2017   Panic disorder 06/20/2017   Osteoporosis 06/20/2017   Noninflammatory disorder of vulva and perineum 06/20/2017   Mitral valve prolapse 06/20/2017   Low back pain 06/20/2017   Leiomyoma of uterus 06/20/2017   Family history of malignant neoplasm of gastrointestinal tract 06/20/2017   Cardiac dysrhythmia 06/20/2017   Postmenopausal  atrophic vaginitis 06/20/2017   Postmenopausal bleeding 06/20/2017   Symptomatic menopausal or female climacteric states 06/20/2017   Essential hypertension 06/20/2017   Chest pain in adult 06/20/2017   Hyperlipidemia 06/20/2017   Elevated LFTs 06/20/2017   Mitral valve disorder 06/20/2017   Atypical chest pain 06/20/2017   Hypertension    Anxiety    Diverticulitis of sigmoid colon s/p colectomy.  Colostomy takedown 05/19/2014 07/25/2013   Perforated sigmoid colon (HCC) 07/25/2013    PCP: Noberto Retort, MD REFERRING PROVIDER: Hurman Horn, MD  REFERRING DIAG: M54.2 (ICD-10-CM) - Cervicalgia  THERAPY DIAG:  Cervicalgia  Dizziness and giddiness  Abnormal posture  Muscle weakness (generalized)  ONSET DATE: ~1 year (noted increased pain after raking leaves)  Rationale for Evaluation and Treatment: Rehabilitation  SUBJECTIVE:   SUBJECTIVE STATEMENT: Pt states that she does feel a little bit better. Pt notes dizziness comes and goes but can't associate it with anything except that with neck pain. Pt has noticed that dizziness hasn't been lasting so much. Has not done exercises as regularly for the dizziness. Neck exercises have been going okay. Only slight neck pain today 1-2/10. Worst neck pain has been a 5/10. Dizziness 1/10 today (feels it after driving).   Pt accompanied by: self  PERTINENT HISTORY: chronic neck pain, worked primarily at a desk  PAIN:  Are you having pain? Yes: NPRS scale: 1-2/10 Pain location: lower cervical Pain description: muscle sore, can radiate up or down (R worse than L), "my head feels too heavy" Aggravating factors: "being awake", bending/leaning forward, using computer  Relieving factors: Heat, exercises, massage  There were no vitals filed for this visit.   PRECAUTIONS: None  RED FLAGS: None   WEIGHT BEARING RESTRICTIONS: No  FALLS: Has patient fallen in last 6 months? No  LIVING ENVIRONMENT: Lives with: lives with their spouse Lives in: House/apartment Stairs: No Has following equipment at home: None  PLOF: Independent  PATIENT GOALS: Decrease neck pain for yard work  OBJECTIVE:  Note: Objective measures were completed at Evaluation unless otherwise noted.  DIAGNOSTIC FINDINGS: 03/27/22 cervical x-ray IMPRESSION: 1. Chronic grade 1 anterolisthesis of C4 on C5 and C5 on C6. 2. Mild degenerative disc disease at C4-C5 and C5-C6. 3. Moderate diffuse facet hypertrophy.  COGNITION: Overall cognitive status: Within  functional limits for tasks assessed   SENSATION: "Not usually"  EDEMA:  None  POSTURE:  rounded shoulders, forward head, and increased thoracic kyphosis  Cervical ROM:    Active A/PROM (deg) eval AROM 02/12/23  Flexion 30 47  Extension 25 40  Right lateral flexion 18 20  Left lateral flexion 15 25  Right rotation 40 58  Left rotation 35 47  (Blank rows = not tested)  UPPER EXTREMITY MMT:  MMT Right eval Left eval Right 12/16 Left 12/16  Shoulder flexion 3+ 3+ 4 4  Shoulder extension 4- 4 4 4   Shoulder abduction 3+ 3+ 4 4  Shoulder adduction      Shoulder internal rotation 5 5 5 5   Shoulder external rotation 3+ 3+ 4+ 4+  Middle trapezius 3+ 4 4 4   Lower trapezius 3 3+ 3+ 3+  Elbow flexion      Elbow extension      Wrist flexion      Wrist extension      Wrist ulnar deviation      Wrist radial deviation      Wrist pronation      Wrist supination      Grip strength       (  Blank rows = not tested)   VESTIBULAR ASSESSMENT   GENERAL OBSERVATION: Ambulates in with no AD, decreased cervical ROM     SYMPTOM BEHAVIOR:   Subjective history: Sometimes when walking, will feel like she is veering. Dizziness happens when bending over or coming to sit back up    Non-Vestibular symptoms: neck pain and headaches   Type of dizziness: Spinning/Vertigo, Unsteady with head/body turns, and Lightheadedness/Faint   Frequency: Every few days    Duration: Seconds   Aggravating factors: Induced by position change: supine to sit and Induced by motion: looking up at the ceiling and bending down to the ground, worse when neck pain is worse    Relieving factors: ead stationary, rest, and slow movements    Progression of symptoms: worse   OCULOMOTOR EXAM:   Ocular Alignment: normal   Ocular ROM: No Limitations   Spontaneous Nystagmus: absent   Gaze-Induced Nystagmus: absent   Smooth Pursuits: intact   Saccades: intact, feels a pulling in the back of her head       VESTIBULAR  - OCULAR REFLEX:    Slow VOR: Normal, feels like she had to focus, felt a little off after stopping    VOR Cancellation: Normal, feels like things are moving after stopping   Head-Impulse Test: HIT Right: negative HIT Left: positive, mild Felt mild dizziness afterwards   Cervical Neck Torsion Test: Negative bilaterally, no dizziness     POSITIONAL TESTING: Right Dix-Hallpike: no nystagmus and a little bit of dizziness coming up  Left Dix-Hallpike: no nystagmus and feels a little bit of dizziness coming back up  Right Sidelying: no nystagmus and feels a little bit of dizziness coming back up Left Sidelying: no nystagmus and feels a little bit of dizziness coming back up   Habituation: Brandt-Daroff: comment: 3 reps each side, pt reporting wooziness when coming upright. Added to HEP     TODAY'S TREATMENT: TherEx Cervical rotation with towel assist isometrics 3x5"   Followed up with cervical rotation AROM x 5 Cervical lateral flexion with towel assist 3x5" Cervical ext with towel assist 3x5" Quadruped cervical retraction x10 Quadruped cervical retraction + rotation + horizontal shoulder abd x10 R&L  Rechecked pt's goals   PATIENT EDUCATION: Education details: PT POC, added to HEP, use of Chirp Wheel XR Person educated: Patient Education method: Programmer, multimedia, Facilities manager, and Handouts Education comprehension: verbalized understanding, returned demonstration, and needs further education  HOME EXERCISE PROGRAM: Access Code: [email or web address] URL: [email or web address] Date: 02/12/2023 Prepared by: Vernon Prey April Kirstie Peri  Exercises - Shoulder External Rotation and Scapular Retraction  - 1 x daily - 7 x weekly - 2 sets - 10 reps - 3 sec hold - Shoulder External Rotation in 45 Degrees Abduction  - 1 x daily - 7 x weekly - 2 sets - 10 reps - 3 sec hold - Gentle Levator Scapulae Stretch  - 1 x daily - 7 x weekly - 1 sets - 3-5 reps - 30 sec hold - Supine Suboccipital  Release with Tennis Balls  - 1 x daily - 7 x weekly - 1 sets - 1 reps - 5-10 min hold - Brandt-Daroff Vestibular Exercise  - 1-2 x daily - 5 x weekly - 1 sets - 4-5 reps - Scapular Retraction with Resistance  - 1 x daily - 7 x weekly - 3 sets - 10 reps - Seated Cervical Retraction and Rotation  - 1 x daily - 7 x weekly - 2 sets - 5 reps - Supine Thoracic  Mobilization Towel Roll Vertical with Arm Stretch  - 1 x daily - 7 x weekly - 3 sets - 10 reps - Open Book Chest Stretch on Towel Roll  - 1 x daily - 7 x weekly - 1 sets - 3-5 reps - 30 sec hold - Seated Shoulder Flexion Full Range  - 1 x daily - 7 x weekly - 1-3 sets - 10 reps - Seated Shoulder Abduction Full Range  - 1 x daily - 7 x weekly - 1-3 sets - 10 reps - Seated Bilateral Shoulder Scaption with Dumbbells  - 1 x daily - 7 x weekly - 1-3 sets - 10 reps - Supine Scapular Protraction in Flexion with Dumbbells  - 1 x daily - 7 x weekly - 1-3 sets - 10 reps - Seated Assisted Cervical Rotation with Towel  - 1 x daily - 7 x weekly - 1 sets - 3 reps - 5 sec hold - Cervical Extension and Sidebending AROM with Strap  - 1 x daily - 7 x weekly - 1 sets - 3 reps - 5 sec hold - Seated Cervical Sidebending with Strap and Overpressure  - 1 x daily - 7 x weekly - 1 sets - 3 reps - 5 sec hold - Quadruped Cervical Retraction  - 1 x daily - 7 x weekly - 2 sets - 10 reps - Quadruped Neck Rotation with Same Side Shoulder Horizontal Abduction  - 1 x daily - 7 x weekly - 2 sets - 10 reps    GOALS: Goals reviewed with patient? Yes  SHORT TERM GOALS: Target date: 01/22/2023  Pt will be ind with initial HEP Baseline: Goal status: MET  2.  Pt will be ind with self management of trigger points and neck pain for sleep and times awake to keep pain </=4/10 Baseline:  Goal status: MET  3.  PT will assess pt's NDI for goal setting Baseline:  Goal status: MET   LONG TERM GOALS: Target date: 03/26/2023    Pt will be ind with management and progression of  HEP Baseline:  Goal status: MET  2.  Pt will demo at least 4/5 bilat UE/midback strength for improved postural stability with bending/yard tasks Baseline:  02/12/23: See above -- remains weakest with low traps Goal status: IN PROGRESS  3.  Pt will demo increased neck ROM to >/=10 deg in all directions for improved mobility Baseline:  02/12/23: all neck ROM has increased to goal level except lateral flexion  Goal status: IN PROGRESS  4.  Pt will have improved NDI score to </=10/50 to demo MCID Baseline: 15/50 (12/3) 02/12/23: Neck Disability Index score: 12 / 50 = 24.0 % Goal status: REVISED  5.  Pt will report greatest neck pain to be no more than 5/10 x 2 weeks Baseline:  02/12/23: States neck pain has not been more than 5/10 Goal status: MET   ASSESSMENT:  CLINICAL IMPRESSION: Rechecked pt's goals. Pt is demonstrating increased neck ROM but is most limited with lateral flexion but has otherwise met all other ROM. She is demonstrating a very good increase in her midback/shoulder strength and has met these strength goals except in her low traps. Pt is 2 points away from meeting her NDI goal. In terms of her neck -- pt is demonstrating good gains. She states her greatest difficulty is tolerating prolonged looking down. Pt would benefit from continued PT to meet her max potential and transition into more vestibular exercises as her neck movement and  pain has continued to decrease.     OBJECTIVE IMPAIRMENTS: decreased activity tolerance, decreased endurance, decreased mobility, difficulty walking, decreased ROM, decreased strength, dizziness, increased fascial restrictions, increased muscle spasms, impaired flexibility, impaired UE functional use, postural dysfunction, and pain.   PLAN:  PT FREQUENCY: 1x/week  PT DURATION: 6 weeks  PLANNED INTERVENTIONS: 97164- PT Re-evaluation, 97110-Therapeutic exercises, 97530- Therapeutic activity, [id number]- Neuromuscular re-education, 97535-  Self Care, 53664- Manual therapy, 95992- Canalith repositioning, 97014- Electrical stimulation (unattended), 97033- Ionotophoresis 4mg /ml Dexamethasone, Patient/Family education, Dry Needling, Joint mobilization, Spinal mobilization, Vestibular training, Cryotherapy, and Moist heat  PLAN FOR NEXT SESSION: Focus on low trap strengthening. Work on neck mobility and deep neck stabilizer strengthening. Review and continue vestibular exercises as tolerated   Yaiza Palazzola April Ma L Beemer, PT, DPT 02/12/23 12:02 PM

## 2023-02-13 ENCOUNTER — Other Ambulatory Visit: Payer: Self-pay | Admitting: Cardiology

## 2023-02-13 DIAGNOSIS — I491 Atrial premature depolarization: Secondary | ICD-10-CM

## 2023-02-14 DIAGNOSIS — I10 Essential (primary) hypertension: Secondary | ICD-10-CM | POA: Diagnosis not present

## 2023-02-26 ENCOUNTER — Encounter: Payer: Self-pay | Admitting: Physical Therapy

## 2023-02-26 ENCOUNTER — Ambulatory Visit: Payer: Medicare HMO | Admitting: Physical Therapy

## 2023-02-26 DIAGNOSIS — R42 Dizziness and giddiness: Secondary | ICD-10-CM | POA: Diagnosis not present

## 2023-02-26 DIAGNOSIS — M6281 Muscle weakness (generalized): Secondary | ICD-10-CM | POA: Diagnosis not present

## 2023-02-26 DIAGNOSIS — R293 Abnormal posture: Secondary | ICD-10-CM

## 2023-02-26 DIAGNOSIS — M542 Cervicalgia: Secondary | ICD-10-CM

## 2023-02-26 NOTE — Therapy (Signed)
OUTPATIENT PHYSICAL THERAPY VESTIBULAR/CERVICAL TREATMENT    Patient Name: Kristen Reyes MRN: [id number] DOB:[birth year], 72 y.o., female Today's Date: 02/26/2023  END OF SESSION:  PT End of Session - 02/26/23 1101     Visit Number 7    Number of Visits 12    Date for PT Re-Evaluation 03/26/23    Authorization Type [employer]    PT Start Time 1100    PT Stop Time 1141    PT Time Calculation (min) 41 min    Activity Tolerance Patient tolerated treatment well    Behavior During Therapy [facility] for tasks assessed/performed                 Past Medical History:  Diagnosis Date   Anxiety    Atypical chest pain 06/20/2017   Cancer (HCC)    skin cancer   Cardiac dysrhythmia 06/20/2017   Chest pain in adult 06/20/2017   Chronic pain 05/28/2020   Colostomy in place [facility])    Complication of anesthesia    Difficulty sleeping    Diverticulitis    Diverticulitis of sigmoid colon s/p colectomy.  Colostomy takedown 05/19/2014 07/25/2013   Elevated LFTs 06/20/2017   Esophageal dysphagia 05/28/2020   Essential hypertension 06/20/2017   Family history of malignant neoplasm of gastrointestinal tract 06/20/2017   Gastroesophageal reflux disease without esophagitis 05/28/2020   History of nonmelanoma skin cancer    Hyperlipidemia 06/20/2017   Hypertension    Leiomyoma of uterus 06/20/2017   Low back pain 06/20/2017   Lymphopenia 05/28/2020   Mitral valve disorder 06/20/2017   Mitral valve prolapse 06/20/2017   MVP (mitral valve prolapse)    Noninflammatory disorder of vulva and perineum 06/20/2017   Osteoporosis    Palpitation 06/20/2017   Panic disorder 06/20/2017   Perforated sigmoid colon (HCC) 07/25/2013   PONV (postoperative nausea and vomiting)    Postmenopausal atrophic vaginitis 06/20/2017   Postmenopausal bleeding 06/20/2017   Primary insomnia 05/28/2020   Pure hypercholesterolemia 05/28/2020   Sleep disorder 05/28/2020   Symptomatic menopausal or female climacteric states 06/20/2017   Past  Surgical History:  Procedure Laterality Date   CARDIAC CATHETERIZATION  2002   COLON SURGERY     COLOSTOMY TAKEDOWN N/A 05/19/2014   Procedure: TAKEDOWN Luz Brazen COLOSTOMY;  Surgeon: Glenna Fellows, MD;  Location: [facility];  Service: General;  Laterality: N/A;   LAPAROTOMY N/A 07/09/2013   Procedure: EXPLORATORY LAPAROTOMY, COLOSTOMY, SIGMOID COLECTOMY, HARTMANS PROCEDURE;  Surgeon: Mariella Saa, MD;  Location: [facility];  Service: General;  Laterality: N/A;   OOPHORECTOMY     Patient Active Problem List   Diagnosis Date Noted   Palpitations 04/08/2021   Chronic pain 05/28/2020   Esophageal dysphagia 05/28/2020   Gastroesophageal reflux disease without esophagitis 05/28/2020   Lymphopenia 05/28/2020   Primary insomnia 05/28/2020   Pure hypercholesterolemia 05/28/2020   Sleep disorder 05/28/2020   PONV (postoperative nausea and vomiting)    MVP (mitral valve prolapse)    History of nonmelanoma skin cancer    Diverticulitis    Difficulty sleeping    Complication of anesthesia    Colostomy in place [facility])    Cancer (HCC)    Palpitation 06/20/2017   Panic disorder 06/20/2017   Osteoporosis 06/20/2017   Noninflammatory disorder of vulva and perineum 06/20/2017   Mitral valve prolapse 06/20/2017   Low back pain 06/20/2017   Leiomyoma of uterus 06/20/2017   Family history of malignant neoplasm of gastrointestinal tract 06/20/2017   Cardiac dysrhythmia 06/20/2017   Postmenopausal  atrophic vaginitis 06/20/2017   Postmenopausal bleeding 06/20/2017   Symptomatic menopausal or female climacteric states 06/20/2017   Essential hypertension 06/20/2017   Chest pain in adult 06/20/2017   Hyperlipidemia 06/20/2017   Elevated LFTs 06/20/2017   Mitral valve disorder 06/20/2017   Atypical chest pain 06/20/2017   Hypertension    Anxiety    Diverticulitis of sigmoid colon s/p colectomy.  Colostomy takedown 05/19/2014 07/25/2013   Perforated sigmoid colon (HCC) 07/25/2013    PCP: Noberto Retort, MD REFERRING PROVIDER: Hurman Horn, MD  REFERRING DIAG: M54.2 (ICD-10-CM) - Cervicalgia  THERAPY DIAG:  Cervicalgia  Dizziness and giddiness  Abnormal posture  ONSET DATE: ~1 year (noted increased pain after raking leaves)  Rationale for Evaluation and Treatment: Rehabilitation  SUBJECTIVE:   SUBJECTIVE STATEMENT: Last week when she had some dizziness when she had to focus on something at the computer. Tried the dizziness exercises that day and felt like it would just make her worse. Feels like she does better when she doesn't do them. Has not done the exercises consistently at home. Overall, not having the dizziness as much. Sometimes will have dizziness after looking up and it doesn't last. Has a lot of exercises for her neck and her back and she feels like that they are helping. Feels like her shoulders are getting stronger. Notes neck pain comes and goes.    Pt accompanied by: self  PERTINENT HISTORY: chronic neck pain, worked primarily at a desk  PAIN:  Are you having pain? Yes: NPRS scale: 1-2/10 Pain location: lower cervical Pain description: muscle sore, can radiate up or down (R worse than L), "my head feels too heavy" Aggravating factors: "being awake", bending/leaning forward, using computer  Relieving factors: Heat, exercises, massage  There were no vitals filed for this visit.   PRECAUTIONS: None  RED FLAGS: None   WEIGHT BEARING RESTRICTIONS: No  FALLS: Has patient fallen in last 6 months? No  LIVING ENVIRONMENT: Lives with: lives with their spouse Lives in: House/apartment Stairs: No Has following equipment at home: None  PLOF: Independent  PATIENT GOALS: Decrease neck pain for yard work  OBJECTIVE:  Note: Objective measures were completed at Evaluation unless otherwise noted.  DIAGNOSTIC FINDINGS: 03/27/22 cervical x-ray IMPRESSION: 1. Chronic grade 1 anterolisthesis of C4 on C5 and C5 on C6. 2. Mild degenerative disc disease  at C4-C5 and C5-C6. 3. Moderate diffuse facet hypertrophy.  COGNITION: Overall cognitive status: Within functional limits for tasks assessed   SENSATION: "Not usually"  EDEMA:  None  POSTURE:  rounded shoulders, forward head, and increased thoracic kyphosis  Cervical ROM:    Active A/PROM (deg) eval AROM 02/12/23  Flexion 30 47  Extension 25 40  Right lateral flexion 18 20  Left lateral flexion 15 25  Right rotation 40 58  Left rotation 35 47  (Blank rows = not tested)  UPPER EXTREMITY MMT:  MMT Right eval Left eval Right 12/16 Left 12/16  Shoulder flexion 3+ 3+ 4 4  Shoulder extension 4- 4 4 4   Shoulder abduction 3+ 3+ 4 4  Shoulder adduction      Shoulder internal rotation 5 5 5 5   Shoulder external rotation 3+ 3+ 4+ 4+  Middle trapezius 3+ 4 4 4   Lower trapezius 3 3+ 3+ 3+  Elbow flexion      Elbow extension      Wrist flexion      Wrist extension      Wrist ulnar deviation  Wrist radial deviation      Wrist pronation      Wrist supination      Grip strength       (Blank rows = not tested)   VESTIBULAR ASSESSMENT   GENERAL OBSERVATION: Ambulates in with no AD, decreased cervical ROM     SYMPTOM BEHAVIOR:   Subjective history: Sometimes when walking, will feel like she is veering. Dizziness happens when bending over or coming to sit back up    Non-Vestibular symptoms: neck pain and headaches   Type of dizziness: Spinning/Vertigo, Unsteady with head/body turns, and Lightheadedness/Faint   Frequency: Every few days    Duration: Seconds   Aggravating factors: Induced by position change: supine to sit and Induced by motion: looking up at the ceiling and bending down to the ground, worse when neck pain is worse    Relieving factors: ead stationary, rest, and slow movements    Progression of symptoms: worse   OCULOMOTOR EXAM:   Ocular Alignment: normal   Ocular ROM: No Limitations   Spontaneous Nystagmus: absent   Gaze-Induced Nystagmus:  absent   Smooth Pursuits: intact   Saccades: intact, feels a pulling in the back of her head       VESTIBULAR - OCULAR REFLEX:    Slow VOR: Normal, feels like she had to focus, felt a little off after stopping    VOR Cancellation: Normal, feels like things are moving after stopping   Head-Impulse Test: HIT Right: negative HIT Left: positive, mild Felt mild dizziness afterwards   Cervical Neck Torsion Test: Negative bilaterally, no dizziness     POSITIONAL TESTING: Right Dix-Hallpike: no nystagmus and a little bit of dizziness coming up  Left Dix-Hallpike: no nystagmus and feels a little bit of dizziness coming back up  Right Sidelying: no nystagmus and feels a little bit of dizziness coming back up Left Sidelying: no nystagmus and feels a little bit of dizziness coming back up   Habituation: Brandt-Daroff: comment: 3 reps each side, pt reporting wooziness when coming upright. Added to HEP     TODAY'S TREATMENT:  TherEx Reviewed new HEP from last session (per pt request) Cervical Extension and Sidebending AROM with Strap, reviewed how to perform with towel, pt reporting she was using a scarf at home, but it felt better using the towel, performed on each side  Seated Cervical Sidebending with Strap and Overpressure, performed on each side Educated on purpose of each exercise    NMR: Gaze Adaptation: x1 Viewing Horizontal: Position: Seated, Time: 30 seconds, Reps: 2, and Comment: pt reports some dizziness, when incr the speed with 2nd rep  x1 Viewing Vertical:  Position: Seated, Time: 30 seconds, Reps: 2, and Comment: pt reporting not so much dizziness, but instead a feeling in her head   Gait with head turns x115', pt more steady with head turns  Gait with head nods x115', pt more unsteady with head nods   Pt reporting feeling a spinny dizzy sensation after coming up from bending over and after looking up, re-assessed positional testing  Therapeutic Activity: POSITIONAL  TESTING: Right Dix-Hallpike: no nystagmus  Left Dix-Hallpike: no nystagmus  Right Sidelying: no nystagmus  Left Sidelying: no nystagmus   Pt negative for BPPV.   Assessed orthostatic BP: Sitting: 134/83, HR: 66 bpm Standing: 120/79  - pt reporting getting slightly dizzy/lightheaded  Discussed that pt not demonstrating orthostatics, but did have a drop in systolic BP from sitting > standing. Discussed making sure pt stays hydrated, taking  time with position changes, and can perform marching/ankle pumps before standing if pt seated for a while.  PATIENT EDUCATION: Education details: See therapeutic activity section, this PT still not sure of cause of pt's dizziness, added seated VOR exercises to HEP  Person educated: Patient Education method: Explanation, Demonstration, and Handouts Education comprehension: verbalized understanding, returned demonstration, and needs further education  HOME EXERCISE PROGRAM: Seated gaze stabilization: 30 seconds horizontal and vertical direction  Access Code: [email or web address] URL: [email or web address] Date: 02/12/2023 Prepared by: Vernon Prey April Kirstie Peri  Exercises - Shoulder External Rotation and Scapular Retraction  - 1 x daily - 7 x weekly - 2 sets - 10 reps - 3 sec hold - Shoulder External Rotation in 45 Degrees Abduction  - 1 x daily - 7 x weekly - 2 sets - 10 reps - 3 sec hold - Gentle Levator Scapulae Stretch  - 1 x daily - 7 x weekly - 1 sets - 3-5 reps - 30 sec hold - Supine Suboccipital Release with Tennis Balls  - 1 x daily - 7 x weekly - 1 sets - 1 reps - 5-10 min hold - Brandt-Daroff Vestibular Exercise  - 1-2 x daily - 5 x weekly - 1 sets - 4-5 reps - Scapular Retraction with Resistance  - 1 x daily - 7 x weekly - 3 sets - 10 reps - Seated Cervical Retraction and Rotation  - 1 x daily - 7 x weekly - 2 sets - 5 reps - Supine Thoracic Mobilization Towel Roll Vertical with Arm Stretch  - 1 x daily - 7 x weekly - 3 sets - 10 reps -  Open Book Chest Stretch on Towel Roll  - 1 x daily - 7 x weekly - 1 sets - 3-5 reps - 30 sec hold - Seated Shoulder Flexion Full Range  - 1 x daily - 7 x weekly - 1-3 sets - 10 reps - Seated Shoulder Abduction Full Range  - 1 x daily - 7 x weekly - 1-3 sets - 10 reps - Seated Bilateral Shoulder Scaption with Dumbbells  - 1 x daily - 7 x weekly - 1-3 sets - 10 reps - Supine Scapular Protraction in Flexion with Dumbbells  - 1 x daily - 7 x weekly - 1-3 sets - 10 reps - Seated Assisted Cervical Rotation with Towel  - 1 x daily - 7 x weekly - 1 sets - 3 reps - 5 sec hold - Cervical Extension and Sidebending AROM with Strap  - 1 x daily - 7 x weekly - 1 sets - 3 reps - 5 sec hold - Seated Cervical Sidebending with Strap and Overpressure  - 1 x daily - 7 x weekly - 1 sets - 3 reps - 5 sec hold - Quadruped Cervical Retraction  - 1 x daily - 7 x weekly - 2 sets - 10 reps - Quadruped Neck Rotation with Same Side Shoulder Horizontal Abduction  - 1 x daily - 7 x weekly - 2 sets - 10 reps    GOALS: Goals reviewed with patient? Yes  SHORT TERM GOALS: Target date: 01/22/2023  Pt will be ind with initial HEP Baseline: Goal status: MET  2.  Pt will be ind with self management of trigger points and neck pain for sleep and times awake to keep pain </=4/10 Baseline:  Goal status: MET  3.  PT will assess pt's NDI for goal setting Baseline:  Goal status: MET   LONG TERM GOALS: Target date: 03/26/2023  Pt will be ind with management and progression of HEP Baseline:  Goal status: MET  2.  Pt will demo at least 4/5 bilat UE/midback strength for improved postural stability with bending/yard tasks Baseline:  02/12/23: See above -- remains weakest with low traps Goal status: IN PROGRESS  3.  Pt will demo increased neck ROM to >/=10 deg in all directions for improved mobility Baseline:  02/12/23: all neck ROM has increased to goal level except lateral flexion  Goal status: IN PROGRESS  4.  Pt  will have improved NDI score to </=10/50 to demo MCID Baseline: 15/50 (12/3) 02/12/23: Neck Disability Index score: 12 / 50 = 24.0 % Goal status: REVISED  5.  Pt will report greatest neck pain to be no more than 5/10 x 2 weeks Baseline:  02/12/23: States neck pain has not been more than 5/10 Goal status: MET   ASSESSMENT:  CLINICAL IMPRESSION:  Pt wishing to review neck exercises added at last session and also wanting to try some exercises for her dizziness. Tried seated VOR exercises with pt having mild dizziness, gave as HEP as it reproduced pt's symptoms. Pt also reporting some spinning dizziness with bending over and looking up. Re-assessed positional testing with pt still negative. Also assessed orthostatics, with pt having a drop in her systolic BP, but not enough to be orthostatics. Still unsure of cause of pt's dizziness at this time. Will continue per POC.   OBJECTIVE IMPAIRMENTS: decreased activity tolerance, decreased endurance, decreased mobility, difficulty walking, decreased ROM, decreased strength, dizziness, increased fascial restrictions, increased muscle spasms, impaired flexibility, impaired UE functional use, postural dysfunction, and pain.   PLAN:  PT FREQUENCY: 1x/week  PT DURATION: 6 weeks  PLANNED INTERVENTIONS: 97164- PT Re-evaluation, 97110-Therapeutic exercises, 97530- Therapeutic activity, [id number]- Neuromuscular re-education, 97535- Self Care, [id number]- Manual therapy, 95992- Canalith repositioning, 97014- Electrical stimulation (unattended), 97033- Ionotophoresis 4mg /ml Dexamethasone, Patient/Family education, Dry Needling, Joint mobilization, Spinal mobilization, Vestibular training, Cryotherapy, and Moist heat  PLAN FOR NEXT SESSION: Focus on low trap strengthening. Work on neck mobility and deep neck stabilizer strengthening. Review and continue vestibular exercises as tolerated  Is pt ready for D/C?    Drake Leach, PT, DPT 02/26/23 12:06 PM

## 2023-02-26 NOTE — Patient Instructions (Signed)
Gaze Stabilization: Sitting    Keeping eyes on target on wall a few feet away, tilt head down 15-30 and move head side to side for __30__ seconds. Repeat while moving head up and down for __30__ seconds.  Perform 2-3 sets of each. Do __2__ sessions per day. Sit in front of a plain background.  When no longer having symptoms in sitting, can try in standing.    Gaze Stabilization: Tip Card  1.Target must remain in focus, not blurry, and appear stationary while head is in motion. 2.Perform exercises with small head movements (45 to either side of midline). 3.Increase speed of head motion so long as target is in focus. 4.If you wear eyeglasses, be sure you can see target through lens (therapist will give specific instructions for bifocal / progressive lenses). 5.These exercises may provoke dizziness or nausea. Work through these symptoms. If too dizzy, slow head movement slightly. Rest between each exercise. 6.Exercises demand concentration; avoid distractions. 7.For safety, perform standing exercises close to a counter, wall, corner, or next to someone.

## 2023-03-05 ENCOUNTER — Ambulatory Visit: Payer: Medicare HMO | Attending: Family Medicine | Admitting: Physical Therapy

## 2023-03-05 DIAGNOSIS — R42 Dizziness and giddiness: Secondary | ICD-10-CM | POA: Diagnosis not present

## 2023-03-05 DIAGNOSIS — M6281 Muscle weakness (generalized): Secondary | ICD-10-CM | POA: Diagnosis not present

## 2023-03-05 DIAGNOSIS — M542 Cervicalgia: Secondary | ICD-10-CM

## 2023-03-05 DIAGNOSIS — R293 Abnormal posture: Secondary | ICD-10-CM | POA: Diagnosis not present

## 2023-03-05 NOTE — Therapy (Signed)
 OUTPATIENT PHYSICAL THERAPY VESTIBULAR/CERVICAL TREATMENT    Patient Name: Kristen Reyes MRN: [id number] DOB:1950/10/16, 73 y.o., female Today's Date: 03/05/2023  END OF SESSION:  PT End of Session - 03/05/23 1101     Visit Number 8    Number of Visits 12    Date for PT Re-Evaluation 03/26/23    Authorization Type [employer]    PT Start Time 1100    PT Stop Time 1145    PT Time Calculation (min) 45 min    Activity Tolerance Patient tolerated treatment well    Behavior During Therapy [facility] for tasks assessed/performed                  Past Medical History:  Diagnosis Date   Anxiety    Atypical chest pain 06/20/2017   Cancer (HCC)    skin cancer   Cardiac dysrhythmia 06/20/2017   Chest pain in adult 06/20/2017   Chronic pain 05/28/2020   Colostomy in place [facility])    Complication of anesthesia    Difficulty sleeping    Diverticulitis    Diverticulitis of sigmoid colon s/p colectomy.  Colostomy takedown 05/19/2014 07/25/2013   Elevated LFTs 06/20/2017   Esophageal dysphagia 05/28/2020   Essential hypertension 06/20/2017   Family history of malignant neoplasm of gastrointestinal tract 06/20/2017   Gastroesophageal reflux disease without esophagitis 05/28/2020   History of nonmelanoma skin cancer    Hyperlipidemia 06/20/2017   Hypertension    Leiomyoma of uterus 06/20/2017   Low back pain 06/20/2017   Lymphopenia 05/28/2020   Mitral valve disorder 06/20/2017   Mitral valve prolapse 06/20/2017   MVP (mitral valve prolapse)    Noninflammatory disorder of vulva and perineum 06/20/2017   Osteoporosis    Palpitation 06/20/2017   Panic disorder 06/20/2017   Perforated sigmoid colon (HCC) 07/25/2013   PONV (postoperative nausea and vomiting)    Postmenopausal atrophic vaginitis 06/20/2017   Postmenopausal bleeding 06/20/2017   Primary insomnia 05/28/2020   Pure hypercholesterolemia 05/28/2020   Sleep disorder 05/28/2020   Symptomatic menopausal or female climacteric states 06/20/2017   Past  Surgical History:  Procedure Laterality Date   CARDIAC CATHETERIZATION  2002   COLON SURGERY     COLOSTOMY TAKEDOWN N/A 05/19/2014   Procedure: TAKEDOWN [place] COLOSTOMY;  Surgeon: Morene Olives, MD;  Location: [facility];  Service: General;  Laterality: N/A;   LAPAROTOMY N/A 07/09/2013   Procedure: EXPLORATORY LAPAROTOMY, COLOSTOMY, SIGMOID COLECTOMY, HARTMANS PROCEDURE;  Surgeon: Morene ONEIDA Olives, MD;  Location: [facility];  Service: General;  Laterality: N/A;   OOPHORECTOMY     Patient Active Problem List   Diagnosis Date Noted   Palpitations 04/08/2021   Chronic pain 05/28/2020   Esophageal dysphagia 05/28/2020   Gastroesophageal reflux disease without esophagitis 05/28/2020   Lymphopenia 05/28/2020   Primary insomnia 05/28/2020   Pure hypercholesterolemia 05/28/2020   Sleep disorder 05/28/2020   PONV (postoperative nausea and vomiting)    MVP (mitral valve prolapse)    History of nonmelanoma skin cancer    Diverticulitis    Difficulty sleeping    Complication of anesthesia    Colostomy in place [place])    Cancer (HCC)    Palpitation 06/20/2017   Panic disorder 06/20/2017   Osteoporosis 06/20/2017   Noninflammatory disorder of vulva and perineum 06/20/2017   Mitral valve prolapse 06/20/2017   Low back pain 06/20/2017   Leiomyoma of uterus 06/20/2017   Family history of malignant neoplasm of gastrointestinal tract 06/20/2017   Cardiac dysrhythmia 06/20/2017  Postmenopausal atrophic vaginitis 06/20/2017   Postmenopausal bleeding 06/20/2017   Symptomatic menopausal or female climacteric states 06/20/2017   Essential hypertension 06/20/2017   Chest pain in adult 06/20/2017   Hyperlipidemia 06/20/2017   Elevated LFTs 06/20/2017   Mitral valve disorder 06/20/2017   Atypical chest pain 06/20/2017   Hypertension    Anxiety    Diverticulitis of sigmoid colon s/p colectomy.  Colostomy takedown 05/19/2014 07/25/2013   Perforated sigmoid colon (HCC) 07/25/2013    PCP: Arloa Elsie SAUNDERS, MD REFERRING PROVIDER: Dasie Fitch, MD  REFERRING DIAG: M54.2 (ICD-10-CM) - Cervicalgia  THERAPY DIAG:  Cervicalgia  Dizziness and giddiness  Muscle weakness (generalized)  Abnormal posture  ONSET DATE: ~1 year (noted increased pain after raking leaves)  Rationale for Evaluation and Treatment: Rehabilitation  SUBJECTIVE:   SUBJECTIVE STATEMENT: Pt reports no acute changes since last visit. Pt reports she has had some mild instances of dizziness since last PT visit. Pt has also tried the new exercises prescribed last session, they didn't make her symptoms worse but not sure if they had much of a difference. Pt has been avoiding the initial dizziness exercises and those do increase her symptoms.  Pt reports that overall her headaches and neck pain are doing better, has had some mild headaches that usually can be knocked out with acetaminophen .  Pt accompanied by: self  PERTINENT HISTORY: chronic neck pain, worked primarily at a desk  PAIN:  Are you having pain? Yes: NPRS scale: 1-2/10 Pain location: lower cervical Pain description: muscle sore, can radiate up or down (R worse than L), my head feels too heavy Aggravating factors: being awake, bending/leaning forward, using computer  Relieving factors: Heat, exercises, massage  There were no vitals filed for this visit.   PRECAUTIONS: None  RED FLAGS: None   WEIGHT BEARING RESTRICTIONS: No  FALLS: Has patient fallen in last 6 months? No  LIVING ENVIRONMENT: Lives with: lives with their spouse Lives in: House/apartment Stairs: No Has following equipment at home: None  PLOF: Independent  PATIENT GOALS: Decrease neck pain for yard work  OBJECTIVE:  Note: Objective measures were completed at Evaluation unless otherwise noted.  DIAGNOSTIC FINDINGS: 03/27/22 cervical x-ray IMPRESSION: 1. Chronic grade 1 anterolisthesis of C4 on C5 and C5 on C6. 2. Mild degenerative disc disease at C4-C5 and  C5-C6. 3. Moderate diffuse facet hypertrophy.  COGNITION: Overall cognitive status: Within functional limits for tasks assessed   SENSATION: Not usually  EDEMA:  None  POSTURE:  rounded shoulders, forward head, and increased thoracic kyphosis  Cervical ROM:    Active A/PROM (deg) eval AROM 02/12/23 AROM 03/05/23  Flexion 30 47 12  Extension 25 40 30  Right lateral flexion 18 20 20   Left lateral flexion 15 25 10   Right rotation 40 58 42  Left rotation 35 47 40  (Blank rows = not tested)  UPPER EXTREMITY MMT:  MMT Right eval Left eval Right 12/16 Left 12/16  Shoulder flexion 3+ 3+ 4 4  Shoulder extension 4- 4 4 4   Shoulder abduction 3+ 3+ 4 4  Shoulder adduction      Shoulder internal rotation 5 5 5 5   Shoulder external rotation 3+ 3+ 4+ 4+  Middle trapezius 3+ 4 4 4   Lower trapezius 3 3+ 3+ 3+  Elbow flexion      Elbow extension      Wrist flexion      Wrist extension      Wrist ulnar deviation  Wrist radial deviation      Wrist pronation      Wrist supination      Grip strength       (Blank rows = not tested)   VESTIBULAR ASSESSMENT   GENERAL OBSERVATION: Ambulates in with no AD, decreased cervical ROM     SYMPTOM BEHAVIOR:   Subjective history: Sometimes when walking, will feel like she is veering. Dizziness happens when bending over or coming to sit back up    Non-Vestibular symptoms: neck pain and headaches   Type of dizziness: Spinning/Vertigo, Unsteady with head/body turns, and Lightheadedness/Faint   Frequency: Every few days    Duration: Seconds   Aggravating factors: Induced by position change: supine to sit and Induced by motion: looking up at the ceiling and bending down to the ground, worse when neck pain is worse    Relieving factors: ead stationary, rest, and slow movements    Progression of symptoms: worse   OCULOMOTOR EXAM:   Ocular Alignment: normal   Ocular ROM: No Limitations   Spontaneous Nystagmus: absent   Gaze-Induced  Nystagmus: absent   Smooth Pursuits: intact   Saccades: intact, feels a pulling in the back of her head       VESTIBULAR - OCULAR REFLEX:    Slow VOR: Normal, feels like she had to focus, felt a little off after stopping    VOR Cancellation: Normal, feels like things are moving after stopping   Head-Impulse Test: HIT Right: negative HIT Left: positive, mild Felt mild dizziness afterwards   Cervical Neck Torsion Test: Negative bilaterally, no dizziness     POSITIONAL TESTING: Right Dix-Hallpike: no nystagmus and a little bit of dizziness coming up  Left Dix-Hallpike: no nystagmus and feels a little bit of dizziness coming back up  Right Sidelying: no nystagmus and feels a little bit of dizziness coming back up Left Sidelying: no nystagmus and feels a little bit of dizziness coming back up   Habituation: Brandt-Daroff: comment: 3 reps each side, pt reporting wooziness when coming upright. Added to HEP     TODAY'S TREATMENT:  TherEx Review of patient's current HEP as she reports some exercises increase her pain: - Shoulder External Rotation and Scapular Retraction (helpful) - Shoulder External Rotation in 45 Degrees Abduction (helpful) - Gentle Levator Scapulae Stretch (encouraged to continue) - Supine Suboccipital Release with Tennis Balls (performs intermittently depending on symptoms) - Scapular Retraction with Resistance (added in RTB) - Seated Cervical Retraction and Rotation (helpful) - Open Book Chest Stretch on Towel Roll  (helpful) - Seated Shoulder Flexion Full Range (continue) - Seated Shoulder Abduction Full Range (continue) - Seated Bilateral Shoulder Scaption with Dumbbells (continue) - Supine Scapular Protraction in Flexion with Dumbbells  (upgrade to 2#) - Seated Assisted Cervical Rotation with Towel  - 1 x daily - 7 x weekly - 1 sets - 3 reps - 5 sec hold (continue) - Cervical Extension and Sidebending AROM with Strap  - 1 x daily - 7 x weekly - 1 sets - 3 reps -  5 sec hold (continue) - Seated UT stretch stretch (added to replace cervical sidebend stretch) - Quadruped Cervical Retraction (mild difficulty) - Quadruped Neck Rotation with Same Side Shoulder Horizontal Abduction (mild increase in symptoms)   Therapeutic Activity: Tenderness to palpation in R UT, trigger points detected in RUT and LUT. Pt declines any TPDN this session, prefers to just keep working on her stretches to address the pain/tightness.  For LTG assessment: NDI: 11/50  Cervical  AROM:  Active A/PROM (deg) eval AROM 02/12/23 AROM 03/05/23  Flexion 30 47 12  Extension 25 40 30  Right lateral flexion 18 20 20   Left lateral flexion 15 25 10   Right rotation 40 58 42  Left rotation 35 47 40     PATIENT EDUCATION: Education details: results of LTG assessment, reviewed HEP, PT POC with plan to d/c next session Person educated: Patient Education method: Explanation, Demonstration, and Handouts Education comprehension: verbalized understanding, returned demonstration, and needs further education  HOME EXERCISE PROGRAM: Seated gaze stabilization: 30 seconds horizontal and vertical direction  Access Code: [email or web address] URL: [email or web address] Date: 02/12/2023 Prepared by: Gellen April Earnie Starring  Exercises - Shoulder External Rotation and Scapular Retraction  - 1 x daily - 7 x weekly - 2 sets - 10 reps - 3 sec hold - Shoulder External Rotation in 45 Degrees Abduction  - 1 x daily - 7 x weekly - 2 sets - 10 reps - 3 sec hold - Gentle Levator Scapulae Stretch  - 1 x daily - 7 x weekly - 1 sets - 3-5 reps - 30 sec hold - Supine Suboccipital Release with Tennis Balls  - 1 x daily - 7 x weekly - 1 sets - 1 reps - 5-10 min hold - Scapular Retraction with Resistance  - 1 x daily - 7 x weekly - 3 sets - 10 reps - Seated Cervical Retraction and Rotation  - 1 x daily - 7 x weekly - 2 sets - 5 reps - Open Book Chest Stretch on Towel Roll  - 1 x daily - 7 x weekly - 1  sets - 3-5 reps - 30 sec hold - Seated Shoulder Flexion Full Range  - 1 x daily - 7 x weekly - 1-3 sets - 10 reps - Seated Shoulder Abduction Full Range  - 1 x daily - 7 x weekly - 1-3 sets - 10 reps - Seated Bilateral Shoulder Scaption with Dumbbells  - 1 x daily - 7 x weekly - 1-3 sets - 10 reps - Supine Scapular Protraction in Flexion with Dumbbells  - 1 x daily - 7 x weekly - 1-3 sets - 10 reps - Seated Assisted Cervical Rotation with Towel  - 1 x daily - 7 x weekly - 1 sets - 3 reps - 5 sec hold - Cervical Extension and Sidebending AROM with Strap  - 1 x daily - 7 x weekly - 1 sets - 3 reps - 5 sec hold - Seated Cervical Sidebending with Strap and Overpressure  - 1 x daily - 7 x weekly - 1 sets - 3 reps - 5 sec hold - Quadruped Cervical Retraction  - 1 x daily - 7 x weekly - 2 sets - 10 reps - Quadruped Neck Rotation with Same Side Shoulder Horizontal Abduction  - 1 x daily - 7 x weekly - 2 sets - 10 reps - Seated Upper Trapezius Stretch  - 1 x daily - 7 x weekly - 1 sets - 5 reps - 30 sec hold    GOALS: Goals reviewed with patient? Yes  SHORT TERM GOALS: Target date: 01/22/2023  Pt will be ind with initial HEP Baseline: Goal status: MET  2.  Pt will be ind with self management of trigger points and neck pain for sleep and times awake to keep pain </=4/10 Baseline:  Goal status: MET  3.  PT will assess pt's NDI for goal setting Baseline:  Goal status: MET   LONG  TERM GOALS: Target date: 03/26/2023  Pt will be ind with management and progression of HEP Baseline:  Goal status: MET  2.  Pt will demo at least 4/5 bilat UE/midback strength for improved postural stability with bending/yard tasks Baseline:  02/12/23: See above -- remains weakest with low traps Goal status: IN PROGRESS  3.  Pt will demo increased neck ROM to >/=10 deg in all directions for improved mobility Baseline:  02/12/23: all neck ROM has increased to goal level except lateral flexion  03/05/23: all neck  ROM has decreased as compared to last assessed values Goal status: IN PROGRESS  4.  Pt will have improved NDI score to </=10/50 to demo MCID Baseline: 15/50 (12/3) 02/12/23: Neck Disability Index score: 12 / 50 = 24.0 % 03/05/23: 11/50, 22% Goal status: IN PROGRESS  5.  Pt will report greatest neck pain to be no more than 5/10 x 2 weeks Baseline:  02/12/23: States neck pain has not been more than 5/10 Goal status: MET   ASSESSMENT:  CLINICAL IMPRESSION: Emphasis of skilled PT session on initiating assessment of LTG to assess patient's readiness for d/c as well as reviewing her HEP. Pt has improved her score on the NDI and demonstrates decreased disability level, did not quite meet her LTG of 10/50 (is at 11/50). Also, reassessed her cervical ROM and the majority of values assessed were worse compared to last assessment, though pt continues to heavily muscle guard. Reviewed pt's HEP and adjusted exercises as appropriate, pt does have mild onset of suboccipital pain following performance of her HEP. Encouraged her to perform suboccipital release with tennis balls once she gets home and to stick to working on 4-5 exercises each day based on her symptoms. Pt agreeable to d/c next session. Continue POC.   OBJECTIVE IMPAIRMENTS: decreased activity tolerance, decreased endurance, decreased mobility, difficulty walking, decreased ROM, decreased strength, dizziness, increased fascial restrictions, increased muscle spasms, impaired flexibility, impaired UE functional use, postural dysfunction, and pain.   PLAN:  PT FREQUENCY: 1x/week  PT DURATION: 6 weeks  PLANNED INTERVENTIONS: 97164- PT Re-evaluation, 97110-Therapeutic exercises, 97530- Therapeutic activity, [id number]- Neuromuscular re-education, 97535- Self Care, [id number]- Manual therapy, [id number]- Canalith repositioning, 97014- Electrical stimulation (unattended), 97033- Ionotophoresis 4mg /ml Dexamethasone , Patient/Family education, Dry Needling, Joint  mobilization, Spinal mobilization, Vestibular training, Cryotherapy, and Moist heat  PLAN FOR NEXT SESSION: Focus on low trap strengthening. Work on neck mobility and deep neck stabilizer strengthening. Review and continue vestibular exercises as tolerated  Assess LTG (reassess trap strength) and d/c (cancel remaining visits) if pt agreeable; any questions over HEP?   Ruthanne Mcneish, PT [place], PT, DPT, CSRS  03/05/23 11:46 AM

## 2023-03-12 ENCOUNTER — Ambulatory Visit: Payer: Medicare HMO | Admitting: Physical Therapy

## 2023-03-12 DIAGNOSIS — R42 Dizziness and giddiness: Secondary | ICD-10-CM | POA: Diagnosis not present

## 2023-03-12 DIAGNOSIS — R293 Abnormal posture: Secondary | ICD-10-CM | POA: Diagnosis not present

## 2023-03-12 DIAGNOSIS — M6281 Muscle weakness (generalized): Secondary | ICD-10-CM | POA: Diagnosis not present

## 2023-03-12 DIAGNOSIS — M542 Cervicalgia: Secondary | ICD-10-CM

## 2023-03-12 NOTE — Therapy (Signed)
 OUTPATIENT PHYSICAL THERAPY VESTIBULAR/CERVICAL TREATMENT - DISCHARGE NOTE    Patient Name: Kristen Reyes MRN: [id number] DOB:[birth year], 73 y.o., female Today's Date: 03/12/2023   PHYSICAL THERAPY DISCHARGE SUMMARY  Visits from Start of Care: 9  Current functional level related to goals / functional outcomes: Independent   Remaining deficits: Ongoing slight pain in her neck and dizziness   Education / Equipment: Handout for HEP   Patient agrees to discharge. Patient goals were partially met. Patient is being discharged due to being pleased with the current functional level.   END OF SESSION:  PT End of Session - 03/12/23 1532     Visit Number 9    Number of Visits 12    Date for PT Re-Evaluation 03/26/23    Authorization Type [employer]    PT Start Time 1530    PT Stop Time 1552   d/c   PT Time Calculation (min) 22 min    Activity Tolerance Patient tolerated treatment well    Behavior During Therapy [facility] for tasks assessed/performed                   Past Medical History:  Diagnosis Date   Anxiety    Atypical chest pain 06/20/2017   Cancer (HCC)    skin cancer   Cardiac dysrhythmia 06/20/2017   Chest pain in adult 06/20/2017   Chronic pain 05/28/2020   Colostomy in place [facility])    Complication of anesthesia    Difficulty sleeping    Diverticulitis    Diverticulitis of sigmoid colon s/p colectomy.  Colostomy takedown 05/19/2014 07/25/2013   Elevated LFTs 06/20/2017   Esophageal dysphagia 05/28/2020   Essential hypertension 06/20/2017   Family history of malignant neoplasm of gastrointestinal tract 06/20/2017   Gastroesophageal reflux disease without esophagitis 05/28/2020   History of nonmelanoma skin cancer    Hyperlipidemia 06/20/2017   Hypertension    Leiomyoma of uterus 06/20/2017   Low back pain 06/20/2017   Lymphopenia 05/28/2020   Mitral valve disorder 06/20/2017   Mitral valve prolapse 06/20/2017   MVP (mitral valve prolapse)    Noninflammatory disorder  of vulva and perineum 06/20/2017   Osteoporosis    Palpitation 06/20/2017   Panic disorder 06/20/2017   Perforated sigmoid colon (HCC) 07/25/2013   PONV (postoperative nausea and vomiting)    Postmenopausal atrophic vaginitis 06/20/2017   Postmenopausal bleeding 06/20/2017   Primary insomnia 05/28/2020   Pure hypercholesterolemia 05/28/2020   Sleep disorder 05/28/2020   Symptomatic menopausal or female climacteric states 06/20/2017   Past Surgical History:  Procedure Laterality Date   CARDIAC CATHETERIZATION  2002   COLON SURGERY     COLOSTOMY TAKEDOWN N/A 05/19/2014   Procedure: TAKEDOWN [place] COLOSTOMY;  Surgeon: Morene Olives, MD;  Location: [facility];  Service: General;  Laterality: N/A;   LAPAROTOMY N/A 07/09/2013   Procedure: EXPLORATORY LAPAROTOMY, COLOSTOMY, SIGMOID COLECTOMY, HARTMANS PROCEDURE;  Surgeon: Morene ONEIDA Olives, MD;  Location: [facility];  Service: General;  Laterality: N/A;   OOPHORECTOMY     Patient Active Problem List   Diagnosis Date Noted   Palpitations 04/08/2021   Chronic pain 05/28/2020   Esophageal dysphagia 05/28/2020   Gastroesophageal reflux disease without esophagitis 05/28/2020   Lymphopenia 05/28/2020   Primary insomnia 05/28/2020   Pure hypercholesterolemia 05/28/2020   Sleep disorder 05/28/2020   PONV (postoperative nausea and vomiting)    MVP (mitral valve prolapse)    History of nonmelanoma skin cancer    Diverticulitis    Difficulty sleeping  Complication of anesthesia    Colostomy in place [facility])    Cancer (HCC)    Palpitation 06/20/2017   Panic disorder 06/20/2017   Osteoporosis 06/20/2017   Noninflammatory disorder of vulva and perineum 06/20/2017   Mitral valve prolapse 06/20/2017   Low back pain 06/20/2017   Leiomyoma of uterus 06/20/2017   Family history of malignant neoplasm of gastrointestinal tract 06/20/2017   Cardiac dysrhythmia 06/20/2017   Postmenopausal atrophic vaginitis 06/20/2017   Postmenopausal bleeding 06/20/2017    Symptomatic menopausal or female climacteric states 06/20/2017   Essential hypertension 06/20/2017   Chest pain in adult 06/20/2017   Hyperlipidemia 06/20/2017   Elevated LFTs 06/20/2017   Mitral valve disorder 06/20/2017   Atypical chest pain 06/20/2017   Hypertension    Anxiety    Diverticulitis of sigmoid colon s/p colectomy.  Colostomy takedown 05/19/2014 07/25/2013   Perforated sigmoid colon (HCC) 07/25/2013    PCP: Arloa Elsie SAUNDERS, MD REFERRING PROVIDER: Dasie Fitch, MD  REFERRING DIAG: M54.2 (ICD-10-CM) - Cervicalgia  THERAPY DIAG:  Cervicalgia  Dizziness and giddiness  Muscle weakness (generalized)  Abnormal posture  ONSET DATE: ~1 year (noted increased pain after raking leaves)  Rationale for Evaluation and Treatment: Rehabilitation  SUBJECTIVE:   SUBJECTIVE STATEMENT: Pt reports that after last visit she had an increase in pain for about 2 days then things got better. Pt unsure what caused an increase in her symptoms, didn't do any unusual increased physical activity, pt has been doing better since those 2 days.  Pt also reports an increase in her neck pain with vertical head movements during VOR exercise that resolves once exercise terminated, question about progressive lenses and horizontal head turns.  Pt reports that overall her headaches and neck pain are doing better, has had some mild headaches that usually can be knocked out with acetaminophen .  Pt accompanied by: self  PERTINENT HISTORY: chronic neck pain, worked primarily at a desk  PAIN:  Are you having pain? Yes: NPRS scale: 1-2/10 Pain location: lower cervical Pain description: muscle sore, can radiate up or down (R worse than L), my head feels too heavy Aggravating factors: being awake, bending/leaning forward, using computer  Relieving factors: Heat, exercises, massage  There were no vitals filed for this visit.   PRECAUTIONS: None  RED FLAGS: None   WEIGHT BEARING  RESTRICTIONS: No  FALLS: Has patient fallen in last 6 months? No  LIVING ENVIRONMENT: Lives with: lives with their spouse Lives in: House/apartment Stairs: No Has following equipment at home: None  PLOF: Independent  PATIENT GOALS: Decrease neck pain for yard work  OBJECTIVE:  Note: Objective measures were completed at Evaluation unless otherwise noted.  DIAGNOSTIC FINDINGS: 03/27/22 cervical x-ray IMPRESSION: 1. Chronic grade 1 anterolisthesis of C4 on C5 and C5 on C6. 2. Mild degenerative disc disease at C4-C5 and C5-C6. 3. Moderate diffuse facet hypertrophy.  COGNITION: Overall cognitive status: Within functional limits for tasks assessed   SENSATION: Not usually  EDEMA:  None  POSTURE:  rounded shoulders, forward head, and increased thoracic kyphosis  Cervical ROM:    Active A/PROM (deg) eval AROM 02/12/23 AROM 03/05/23  Flexion 30 47 12  Extension 25 40 30  Right lateral flexion 18 20 20   Left lateral flexion 15 25 10   Right rotation 40 58 42  Left rotation 35 47 40  (Blank rows = not tested)  UPPER EXTREMITY MMT:  MMT Right eval Left eval Right 12/16 Left 12/16  Shoulder flexion 3+ 3+ 4 4  Shoulder extension 4- 4 4 4   Shoulder abduction 3+ 3+ 4 4  Shoulder adduction      Shoulder internal rotation 5 5 5 5   Shoulder external rotation 3+ 3+ 4+ 4+  Middle trapezius 3+ 4 4 4   Lower trapezius 3 3+ 3+ 3+  Elbow flexion      Elbow extension      Wrist flexion      Wrist extension      Wrist ulnar deviation      Wrist radial deviation      Wrist pronation      Wrist supination      Grip strength       (Blank rows = not tested)   VESTIBULAR ASSESSMENT   GENERAL OBSERVATION: Ambulates in with no AD, decreased cervical ROM     SYMPTOM BEHAVIOR:   Subjective history: Sometimes when walking, will feel like she is veering. Dizziness happens when bending over or coming to sit back up    Non-Vestibular symptoms: neck pain and headaches   Type of  dizziness: Spinning/Vertigo, Unsteady with head/body turns, and Lightheadedness/Faint   Frequency: Every few days    Duration: Seconds   Aggravating factors: Induced by position change: supine to sit and Induced by motion: looking up at the ceiling and bending down to the ground, worse when neck pain is worse    Relieving factors: ead stationary, rest, and slow movements    Progression of symptoms: worse   OCULOMOTOR EXAM:   Ocular Alignment: normal   Ocular ROM: No Limitations   Spontaneous Nystagmus: absent   Gaze-Induced Nystagmus: absent   Smooth Pursuits: intact   Saccades: intact, feels a pulling in the back of her head       VESTIBULAR - OCULAR REFLEX:    Slow VOR: Normal, feels like she had to focus, felt a little off after stopping    VOR Cancellation: Normal, feels like things are moving after stopping   Head-Impulse Test: HIT Right: negative HIT Left: positive, mild Felt mild dizziness afterwards   Cervical Neck Torsion Test: Negative bilaterally, no dizziness     POSITIONAL TESTING: Right Dix-Hallpike: no nystagmus and a little bit of dizziness coming up  Left Dix-Hallpike: no nystagmus and feels a little bit of dizziness coming back up  Right Sidelying: no nystagmus and feels a little bit of dizziness coming back up Left Sidelying: no nystagmus and feels a little bit of dizziness coming back up   Habituation: Brandt-Daroff: comment: 3 reps each side, pt reporting wooziness when coming upright. Added to HEP     TODAY'S TREATMENT:  TherEx Verbally reviewed HEP that was reviewed last session. Encouraged pt to continue to monitor her pain and decrease frequency of exercises if they increase her pain, rotate which exercises she is doing each day.  Per primary PT pt to perform horizontal head turn VOR exercises with progressive lenses just to the point that her vision becomes blurry/unfocused.   Therapeutic Activity: For LTG assessment: Reassessed bilateral trap  strength: UT: 5/5 B Mid trap: 3/5 B Lower trap: 4/5 B   PATIENT EDUCATION: Education details: results of LTG assessment, reviewed HEP, PT POC with plan to d/c this session Person educated: Patient Education method: Explanation Education comprehension: verbalized understanding  HOME EXERCISE PROGRAM: Seated gaze stabilization: 30 seconds horizontal and vertical direction  Access Code: [email or web address] URL: [email or web address] Date: 02/12/2023 Prepared by: Gellen April Earnie Starring  Exercises - Shoulder External Rotation and Scapular Retraction  - 1 x daily -  7 x weekly - 2 sets - 10 reps - 3 sec hold - Shoulder External Rotation in 45 Degrees Abduction  - 1 x daily - 7 x weekly - 2 sets - 10 reps - 3 sec hold - Gentle Levator Scapulae Stretch  - 1 x daily - 7 x weekly - 1 sets - 3-5 reps - 30 sec hold - Supine Suboccipital Release with Tennis Balls  - 1 x daily - 7 x weekly - 1 sets - 1 reps - 5-10 min hold - Scapular Retraction with Resistance  - 1 x daily - 7 x weekly - 3 sets - 10 reps - Seated Cervical Retraction and Rotation  - 1 x daily - 7 x weekly - 2 sets - 5 reps - Open Book Chest Stretch on Towel Roll  - 1 x daily - 7 x weekly - 1 sets - 3-5 reps - 30 sec hold - Seated Shoulder Flexion Full Range  - 1 x daily - 7 x weekly - 1-3 sets - 10 reps - Seated Shoulder Abduction Full Range  - 1 x daily - 7 x weekly - 1-3 sets - 10 reps - Seated Bilateral Shoulder Scaption with Dumbbells  - 1 x daily - 7 x weekly - 1-3 sets - 10 reps - Supine Scapular Protraction in Flexion with Dumbbells  - 1 x daily - 7 x weekly - 1-3 sets - 10 reps - Seated Assisted Cervical Rotation with Towel  - 1 x daily - 7 x weekly - 1 sets - 3 reps - 5 sec hold - Cervical Extension and Sidebending AROM with Strap  - 1 x daily - 7 x weekly - 1 sets - 3 reps - 5 sec hold - Seated Cervical Sidebending with Strap and Overpressure  - 1 x daily - 7 x weekly - 1 sets - 3 reps - 5 sec hold - Quadruped  Cervical Retraction  - 1 x daily - 7 x weekly - 2 sets - 10 reps - Quadruped Neck Rotation with Same Side Shoulder Horizontal Abduction  - 1 x daily - 7 x weekly - 2 sets - 10 reps - Seated Upper Trapezius Stretch  - 1 x daily - 7 x weekly - 1 sets - 5 reps - 30 sec hold    GOALS: Goals reviewed with patient? Yes  SHORT TERM GOALS: Target date: 01/22/2023  Pt will be ind with initial HEP Baseline: Goal status: MET  2.  Pt will be ind with self management of trigger points and neck pain for sleep and times awake to keep pain </=4/10 Baseline:  Goal status: MET  3.  PT will assess pt's NDI for goal setting Baseline:  Goal status: MET   LONG TERM GOALS: Target date: 03/26/2023  Pt will be ind with management and progression of HEP Baseline:  Goal status: MET  2.  Pt will demo at least 4/5 bilat UE/midback strength for improved postural stability with bending/yard tasks Baseline:  02/12/23: See above -- remains weakest with low traps 03/12/23: see above, 3/5 in mid traps Goal status: NOT MET  3.  Pt will demo increased neck ROM to >/=10 deg in all directions for improved mobility Baseline:  02/12/23: all neck ROM has increased to goal level except lateral flexion  03/05/23: all neck ROM has decreased as compared to last assessed values Goal status: NOT MET  4.  Pt will have improved NDI score to </=10/50 to demo MCID Baseline: 15/50 (12/3) 02/12/23:  Neck Disability Index score: 12 / 50 = 24.0 % 03/05/23: 11/50, 22% Goal status: NOT MET  5.  Pt will report greatest neck pain to be no more than 5/10 x 2 weeks Baseline:  02/12/23: States neck pain has not been more than 5/10 Goal status: MET   ASSESSMENT:  CLINICAL IMPRESSION: Emphasis of skilled PT session on assessing remaining LTG and answering remaining questions about her HEP. Pt has met 2/5 LTG due to being independent with her final HEP and exhibited decreased neck pain/improvement in pain symptoms. She does exhibit  decreased disability level based on her score on the NDI and improved mid-back/trap strength as compared to initial eval but did not quite show enough improvement to meet her goal level. Additionally, she does exhibit decreased cervical ROM upon last assessment due to ongoing muscle guarding. Pt agreeable to d/c from OPPT services at this time and continue with her HEP at home independently.   OBJECTIVE IMPAIRMENTS: decreased activity tolerance, decreased endurance, decreased mobility, difficulty walking, decreased ROM, decreased strength, dizziness, increased fascial restrictions, increased muscle spasms, impaired flexibility, impaired UE functional use, postural dysfunction, and pain.      Karsten Vaughn, PT [place], PT, DPT, CSRS  03/12/23 3:52 PM

## 2023-03-19 ENCOUNTER — Ambulatory Visit: Payer: Medicare HMO | Admitting: Physical Therapy

## 2023-03-26 ENCOUNTER — Ambulatory Visit: Payer: Medicare HMO | Admitting: Physical Therapy

## 2023-04-02 ENCOUNTER — Ambulatory Visit: Payer: Medicare HMO | Admitting: Physical Therapy

## 2023-04-05 ENCOUNTER — Encounter: Payer: Self-pay | Admitting: Physical Therapy

## 2023-04-05 DIAGNOSIS — R350 Frequency of micturition: Secondary | ICD-10-CM | POA: Diagnosis not present

## 2023-04-05 DIAGNOSIS — M542 Cervicalgia: Secondary | ICD-10-CM | POA: Diagnosis not present

## 2023-04-05 DIAGNOSIS — R748 Abnormal levels of other serum enzymes: Secondary | ICD-10-CM | POA: Diagnosis not present

## 2023-04-05 DIAGNOSIS — Z0000 Encounter for general adult medical examination without abnormal findings: Secondary | ICD-10-CM | POA: Diagnosis not present

## 2023-04-05 DIAGNOSIS — E7800 Pure hypercholesterolemia, unspecified: Secondary | ICD-10-CM | POA: Diagnosis not present

## 2023-04-05 DIAGNOSIS — I10 Essential (primary) hypertension: Secondary | ICD-10-CM | POA: Diagnosis not present

## 2023-04-05 DIAGNOSIS — F419 Anxiety disorder, unspecified: Secondary | ICD-10-CM | POA: Diagnosis not present

## 2023-04-05 DIAGNOSIS — R002 Palpitations: Secondary | ICD-10-CM | POA: Diagnosis not present

## 2023-04-05 DIAGNOSIS — F5101 Primary insomnia: Secondary | ICD-10-CM | POA: Diagnosis not present

## 2023-04-12 DIAGNOSIS — L821 Other seborrheic keratosis: Secondary | ICD-10-CM | POA: Diagnosis not present

## 2023-04-12 DIAGNOSIS — L814 Other melanin hyperpigmentation: Secondary | ICD-10-CM | POA: Diagnosis not present

## 2023-04-12 DIAGNOSIS — Z85828 Personal history of other malignant neoplasm of skin: Secondary | ICD-10-CM | POA: Diagnosis not present

## 2023-04-12 DIAGNOSIS — Z09 Encounter for follow-up examination after completed treatment for conditions other than malignant neoplasm: Secondary | ICD-10-CM | POA: Diagnosis not present

## 2023-04-12 DIAGNOSIS — L570 Actinic keratosis: Secondary | ICD-10-CM | POA: Diagnosis not present

## 2023-04-12 DIAGNOSIS — L578 Other skin changes due to chronic exposure to nonionizing radiation: Secondary | ICD-10-CM | POA: Diagnosis not present

## 2023-04-12 DIAGNOSIS — D225 Melanocytic nevi of trunk: Secondary | ICD-10-CM | POA: Diagnosis not present

## 2023-04-12 DIAGNOSIS — Z08 Encounter for follow-up examination after completed treatment for malignant neoplasm: Secondary | ICD-10-CM | POA: Diagnosis not present

## 2023-06-19 DIAGNOSIS — M542 Cervicalgia: Secondary | ICD-10-CM | POA: Insufficient documentation

## 2023-06-20 ENCOUNTER — Ambulatory Visit: Attending: Cardiology | Admitting: Cardiology

## 2023-06-20 ENCOUNTER — Encounter: Payer: Self-pay | Admitting: Cardiology

## 2023-06-20 VITALS — BP 136/78 | HR 69 | Ht 63.0 in | Wt 100.0 lb

## 2023-06-20 DIAGNOSIS — I341 Nonrheumatic mitral (valve) prolapse: Secondary | ICD-10-CM

## 2023-06-20 DIAGNOSIS — R002 Palpitations: Secondary | ICD-10-CM | POA: Diagnosis not present

## 2023-06-20 DIAGNOSIS — I10 Essential (primary) hypertension: Secondary | ICD-10-CM | POA: Diagnosis not present

## 2023-06-20 NOTE — Patient Instructions (Signed)

## 2023-06-20 NOTE — Progress Notes (Unsigned)
 Cardiology Office Note:    Date:  06/20/2023   ID:  [name], DOB 1950/06/13, MRN [id number]  PCP:  Roselind Congo, MD  Cardiologist:  Ralene Burger, MD    Referring MD: Roselind Congo, MD   Chief Complaint  Patient presents with   yearly follow up  Doing fine  History of Present Illness:    Kristen Reyes is a 73 y.o. female past medical history significant for mitral valve prolapse. Echocardiogram (mid, mild MR, palpitations, for APCs, dyslipidemia, essential hypertension.  Comes today to months for follow-up overall doing well sometimes she will have some palpitation but is overall and nonsustained.  No chest pain tightness squeezing pressure in the chest, still relatively active but admits that she does not keep up well with her activities.  Past Medical History:  Diagnosis Date   Anxiety    Atypical chest pain 06/20/2017   Cancer [facility])    skin cancer   Cardiac dysrhythmia 06/20/2017   Chest pain in adult 06/20/2017   Chronic pain 05/28/2020   Colostomy in place [facility])    Complication of anesthesia    Difficulty sleeping    Diverticulitis    Diverticulitis of sigmoid colon s/p colectomy.  Colostomy takedown 05/19/2014 07/25/2013   Elevated LFTs 06/20/2017   Esophageal dysphagia 05/28/2020   Essential hypertension 06/20/2017   Family history of malignant neoplasm of gastrointestinal tract 06/20/2017   Gastroesophageal reflux disease without esophagitis 05/28/2020   History of nonmelanoma skin cancer    Hyperlipidemia 06/20/2017   Hypertension    Leiomyoma of uterus 06/20/2017   Low back pain 06/20/2017   Lymphopenia 05/28/2020   Mitral valve disorder 06/20/2017   Mitral valve prolapse 06/20/2017   MVP (mitral valve prolapse)    Noninflammatory disorder of vulva and perineum 06/20/2017   Osteoporosis    Palpitation 06/20/2017   Panic disorder 06/20/2017   Perforated sigmoid colon (HCC) 07/25/2013   PONV (postoperative nausea and vomiting)    Postmenopausal atrophic  vaginitis 06/20/2017   Postmenopausal bleeding 06/20/2017   Primary insomnia 05/28/2020   Pure hypercholesterolemia 05/28/2020   Sleep disorder 05/28/2020   Symptomatic menopausal or female climacteric states 06/20/2017    Past Surgical History:  Procedure Laterality Date   CARDIAC CATHETERIZATION  2002   COLON SURGERY     COLOSTOMY TAKEDOWN N/A 05/19/2014   Procedure: TAKEDOWN [place] COLOSTOMY;  Surgeon: Ayesha Lente, MD;  Location: [facility];  Service: General;  Laterality: N/A;   LAPAROTOMY N/A 07/09/2013   Procedure: EXPLORATORY LAPAROTOMY, COLOSTOMY, SIGMOID COLECTOMY, HARTMANS PROCEDURE;  Surgeon: Quitman Bucy, MD;  Location: [facility];  Service: General;  Laterality: N/A;   OOPHORECTOMY      Current Medications: Current Meds  Medication Sig   acebutolol  (SECTRAL ) 200 MG capsule TAKE 1 CAPSULE BY MOUTH DAILY. TAKE 1 EXTRA CAPSULE (200 MG) IN THE EVENING IF NEEDED. Patient needs an appointment for further refills. 3 rd/final attempt (Patient taking differently: Take 200 mg by mouth See admin instructions. TAKE 1 CAPSULE BY MOUTH DAILY. TAKE 1 EXTRA CAPSULE (200 MG) IN THE EVENING IF NEEDED. Patient needs an appointment for further refills. 3 rd/final attempt)   acetaminophen  (TYLENOL ) 500 MG tablet Take 500 mg by mouth every 6 (six) hours as needed for mild pain (pain score 1-3) or moderate pain (pain score 4-6).   ALPRAZolam  (XANAX ) 0.5 MG tablet Take 0.125-0.25 mg by mouth at bedtime as needed for anxiety.   LOSARTAN POTASSIUM PO Take 1 tablet by mouth daily.  Multiple Vitamin (MULTIVITAMIN WITH MINERALS) TABS tablet Take 1 tablet by mouth daily.   Omeprazole 20 MG TBDD Take 20 mg by mouth daily.   vitamin C (ASCORBIC ACID) 500 MG tablet Take 500 mg by mouth daily.   zolpidem (AMBIEN) 10 MG tablet Take 0.5 mg by mouth at bedtime as needed for sleep.   [DISCONTINUED] Magnesium 200 MG TABS Take 200 mg by mouth daily.     Allergies:   Erythromycin, Erythromycin base, Novocain  [procaine], and Codeine   Social History   Socioeconomic History   Marital status: Married    Spouse name: Not on file   Number of children: Not on file   Years of education: Not on file   Highest education level: Not on file  Occupational History   Not on file  Tobacco Use   Smoking status: Former    Current packs/day: 0.00    Types: Cigarettes    Quit date: 05/14/1970    Years since quitting: 53.1    Passive exposure: Past   Smokeless tobacco: Never  Vaping Use   Vaping status: Never Used  Substance and Sexual Activity   Alcohol use: Yes    Comment: social   Drug use: No   Sexual activity: Not on file  Other Topics Concern   Not on file  Social History Narrative   Not on file   Social Drivers of Health   Financial Resource Strain: Not on file  Food Insecurity: Not on file  Transportation Needs: Not on file  Physical Activity: Not on file  Stress: Not on file  Social Connections: Not on file     Family History: The patient's family history includes Hyperlipidemia in her father and mother; Hypertension in her father and mother. ROS:   Please see the history of present illness.    All 14 point review of systems negative except as described per history of present illness  EKGs/Labs/Other Studies Reviewed:         Recent Labs: No results found for requested labs within last 365 days.  Recent Lipid Panel    Component Value Date/Time   TRIG 116 07/21/2013 0355    Physical Exam:    VS:  BP 136/78 (BP Location: Right Arm, Patient Position: Sitting)   Pulse 69   Ht 5\' 3"  (1.6 m)   Wt 100 lb (45.4 kg)   SpO2 97%   BMI 17.71 kg/m     Wt Readings from Last 3 Encounters:  06/20/23 100 lb (45.4 kg)  09/20/21 101 lb (45.8 kg)  06/16/21 99 lb 0.6 oz (44.9 kg)     GEN:  Well nourished, well developed in no acute distress HEENT: Normal NECK: No JVD; No carotid bruits LYMPHATICS: No lymphadenopathy CARDIAC: RRR, no murmurs, no rubs, no  gallops RESPIRATORY:  Clear to auscultation without rales, wheezing or rhonchi  ABDOMEN: Soft, non-tender, non-distended MUSCULOSKELETAL:  No edema; No deformity  SKIN: Warm and dry LOWER EXTREMITIES: no swelling NEUROLOGIC:  Alert and oriented x 3 PSYCHIATRIC:  Normal affect   ASSESSMENT:    1. Palpitations   2. Essential hypertension   3. MVP (mitral valve prolapse)    PLAN:    In order of problems listed above:  Palpitations.  Controlled with current medical regiment which I will continue. Essential hypertension blood pressure well-controlled. Dyslipidemia did review K PN which show me LDL of 87 HDL 64 this is from 2-6.5 we will continue present management. We did spend greater than With him  to exercise but she is trying to with intensity exercises   Medication Adjustments/Labs and Tests Ordered: Current medicines are reviewed at length with the patient today.  Concerns regarding medicines are outlined above.  Orders Placed This Encounter  Procedures   EKG 12-Lead   Medication changes: No orders of the defined types were placed in this encounter.   Signed, Manfred Seed, MD, [facility] 06/20/2023 9:17 AM    [facility]

## 2023-07-10 DIAGNOSIS — Z800 Family history of malignant neoplasm of digestive organs: Secondary | ICD-10-CM | POA: Diagnosis not present

## 2023-07-10 DIAGNOSIS — Z860101 Personal history of adenomatous and serrated colon polyps: Secondary | ICD-10-CM | POA: Diagnosis not present

## 2023-08-10 DIAGNOSIS — L578 Other skin changes due to chronic exposure to nonionizing radiation: Secondary | ICD-10-CM | POA: Diagnosis not present

## 2023-08-10 DIAGNOSIS — D485 Neoplasm of uncertain behavior of skin: Secondary | ICD-10-CM | POA: Diagnosis not present

## 2023-08-10 DIAGNOSIS — L570 Actinic keratosis: Secondary | ICD-10-CM | POA: Diagnosis not present

## 2023-08-10 DIAGNOSIS — L821 Other seborrheic keratosis: Secondary | ICD-10-CM | POA: Diagnosis not present

## 2023-08-24 DIAGNOSIS — Z860101 Personal history of adenomatous and serrated colon polyps: Secondary | ICD-10-CM | POA: Diagnosis not present

## 2023-08-24 DIAGNOSIS — D122 Benign neoplasm of ascending colon: Secondary | ICD-10-CM | POA: Diagnosis not present

## 2023-08-24 DIAGNOSIS — K648 Other hemorrhoids: Secondary | ICD-10-CM | POA: Diagnosis not present

## 2023-08-24 DIAGNOSIS — Z980 Intestinal bypass and anastomosis status: Secondary | ICD-10-CM | POA: Diagnosis not present

## 2023-08-24 DIAGNOSIS — Z09 Encounter for follow-up examination after completed treatment for conditions other than malignant neoplasm: Secondary | ICD-10-CM | POA: Diagnosis not present

## 2023-08-24 DIAGNOSIS — Z800 Family history of malignant neoplasm of digestive organs: Secondary | ICD-10-CM | POA: Diagnosis not present

## 2023-08-28 DIAGNOSIS — D122 Benign neoplasm of ascending colon: Secondary | ICD-10-CM | POA: Diagnosis not present

## 2023-10-03 DIAGNOSIS — K219 Gastro-esophageal reflux disease without esophagitis: Secondary | ICD-10-CM | POA: Diagnosis not present

## 2023-10-03 DIAGNOSIS — R002 Palpitations: Secondary | ICD-10-CM | POA: Diagnosis not present

## 2023-10-03 DIAGNOSIS — E7800 Pure hypercholesterolemia, unspecified: Secondary | ICD-10-CM | POA: Diagnosis not present

## 2023-10-03 DIAGNOSIS — M542 Cervicalgia: Secondary | ICD-10-CM | POA: Diagnosis not present

## 2023-10-03 DIAGNOSIS — I10 Essential (primary) hypertension: Secondary | ICD-10-CM | POA: Diagnosis not present

## 2023-10-03 DIAGNOSIS — F5101 Primary insomnia: Secondary | ICD-10-CM | POA: Diagnosis not present

## 2023-10-03 DIAGNOSIS — F419 Anxiety disorder, unspecified: Secondary | ICD-10-CM | POA: Diagnosis not present

## 2023-10-12 ENCOUNTER — Other Ambulatory Visit: Payer: Self-pay | Admitting: Family Medicine

## 2023-10-12 DIAGNOSIS — Z1231 Encounter for screening mammogram for malignant neoplasm of breast: Secondary | ICD-10-CM

## 2023-11-01 ENCOUNTER — Ambulatory Visit
Admission: RE | Admit: 2023-11-01 | Discharge: 2023-11-01 | Disposition: A | Discharge status: Home / Self Care | Source: Ambulatory Visit | Attending: Family Medicine | Admitting: Family Medicine

## 2023-11-01 DIAGNOSIS — Z1231 Encounter for screening mammogram for malignant neoplasm of breast: Secondary | ICD-10-CM | POA: Diagnosis not present

## 2023-12-28 DIAGNOSIS — S96912A Strain of unspecified muscle and tendon at ankle and foot level, left foot, initial encounter: Secondary | ICD-10-CM | POA: Diagnosis not present
